# Patient Record
Sex: Male | Born: 1960 | Race: White | Hispanic: No | Marital: Married | Smoking: Never smoker
Health system: Southern US, Community
[De-identification: ages and names within clinical notes are randomized; demographics above are authoritative.]

## PROBLEM LIST (undated history)

## (undated) DIAGNOSIS — F32A Depression, unspecified: Secondary | ICD-10-CM

## (undated) DIAGNOSIS — I10 Essential (primary) hypertension: Secondary | ICD-10-CM

## (undated) DIAGNOSIS — G4733 Obstructive sleep apnea (adult) (pediatric): Secondary | ICD-10-CM

## (undated) DIAGNOSIS — M5137 Other intervertebral disc degeneration, lumbosacral region: Secondary | ICD-10-CM

## (undated) DIAGNOSIS — M2342 Loose body in knee, left knee: Secondary | ICD-10-CM

## (undated) DIAGNOSIS — F988 Other specified behavioral and emotional disorders with onset usually occurring in childhood and adolescence: Secondary | ICD-10-CM

## (undated) DIAGNOSIS — M1711 Unilateral primary osteoarthritis, right knee: Secondary | ICD-10-CM

## (undated) DIAGNOSIS — M51379 Other intervertebral disc degeneration, lumbosacral region without mention of lumbar back pain or lower extremity pain: Secondary | ICD-10-CM

## (undated) DIAGNOSIS — J45909 Unspecified asthma, uncomplicated: Secondary | ICD-10-CM

## (undated) DIAGNOSIS — F329 Major depressive disorder, single episode, unspecified: Secondary | ICD-10-CM

## (undated) DIAGNOSIS — T7840XA Allergy, unspecified, initial encounter: Secondary | ICD-10-CM

## (undated) DIAGNOSIS — M1990 Unspecified osteoarthritis, unspecified site: Secondary | ICD-10-CM

## (undated) DIAGNOSIS — E785 Hyperlipidemia, unspecified: Secondary | ICD-10-CM

## (undated) DIAGNOSIS — D649 Anemia, unspecified: Secondary | ICD-10-CM

## (undated) DIAGNOSIS — E039 Hypothyroidism, unspecified: Secondary | ICD-10-CM

## (undated) DIAGNOSIS — M1712 Unilateral primary osteoarthritis, left knee: Secondary | ICD-10-CM

## (undated) DIAGNOSIS — E119 Type 2 diabetes mellitus without complications: Secondary | ICD-10-CM

## (undated) HISTORY — DX: Type 2 diabetes mellitus without complications: E11.9

## (undated) HISTORY — DX: Major depressive disorder, single episode, unspecified: F32.9

## (undated) HISTORY — DX: Depression, unspecified: F32.A

## (undated) HISTORY — DX: Allergy, unspecified, initial encounter: T78.40XA

## (undated) HISTORY — DX: Anemia, unspecified: D64.9

## (undated) HISTORY — DX: Other specified behavioral and emotional disorders with onset usually occurring in childhood and adolescence: F98.8

## (undated) HISTORY — DX: Hyperlipidemia, unspecified: E78.5

## (undated) HISTORY — DX: Unspecified osteoarthritis, unspecified site: M19.90

## (undated) HISTORY — PX: HAND TENDON SURGERY: SHX663

## (undated) HISTORY — PX: KNEE ARTHROSCOPY: SUR90

## (undated) HISTORY — DX: Obstructive sleep apnea (adult) (pediatric): G47.33

## (undated) HISTORY — DX: Essential (primary) hypertension: I10

## (undated) HISTORY — PX: JOINT REPLACEMENT: SHX530

---
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
## 1998-08-06 HISTORY — PX: RHINOPLASTY: SUR1284

## 1999-03-16 ENCOUNTER — Emergency Department (HOSPITAL_COMMUNITY): Admission: EM | Admit: 1999-03-16 | Discharge: 1999-03-16 | Payer: Self-pay | Admitting: Emergency Medicine

## 1999-04-07 HISTORY — PX: RHINOPLASTY: SUR1284

## 2004-06-10 ENCOUNTER — Ambulatory Visit: Payer: Self-pay | Admitting: Family Medicine

## 2004-07-17 ENCOUNTER — Ambulatory Visit: Payer: Self-pay | Admitting: Internal Medicine

## 2004-07-28 ENCOUNTER — Ambulatory Visit: Payer: Self-pay | Admitting: Internal Medicine

## 2005-01-15 ENCOUNTER — Ambulatory Visit: Payer: Self-pay | Admitting: Internal Medicine

## 2005-08-07 ENCOUNTER — Ambulatory Visit: Payer: Self-pay | Admitting: Internal Medicine

## 2006-05-21 ENCOUNTER — Ambulatory Visit: Payer: Self-pay | Admitting: Family Medicine

## 2006-05-21 LAB — CONVERTED CEMR LAB
AST: 29 units/L (ref 0–37)
BUN: 17 mg/dL (ref 6–23)
CO2: 29 meq/L (ref 19–32)
Calcium: 9.9 mg/dL (ref 8.4–10.5)
Chloride: 101 meq/L (ref 96–112)
Creatinine, Ser: 1.2 mg/dL (ref 0.4–1.5)
GFR calc non Af Amer: 70 mL/min
Glucose, Bld: 108 mg/dL — ABNORMAL HIGH (ref 70–99)
HCT: 45 % (ref 39.0–52.0)
Hemoglobin: 14.9 g/dL (ref 13.0–17.0)
Lymphocytes Relative: 24.4 % (ref 12.0–46.0)
MCHC: 33.1 g/dL (ref 30.0–36.0)
MCV: 85.9 fL (ref 78.0–100.0)
Monocytes Absolute: 0.6 10*3/uL (ref 0.2–0.7)
Monocytes Relative: 6.3 % (ref 3.0–11.0)
Neutro Abs: 6.2 10*3/uL (ref 1.4–7.7)
Platelets: 269 10*3/uL (ref 150–400)
RBC: 5.24 M/uL (ref 4.22–5.81)
Sodium: 140 meq/L (ref 135–145)
Total Bilirubin: 0.9 mg/dL (ref 0.3–1.2)
Total Protein: 6.6 g/dL (ref 6.0–8.3)
WBC: 9.1 10*3/uL (ref 4.5–10.5)

## 2006-07-24 ENCOUNTER — Ambulatory Visit: Payer: Self-pay | Admitting: Internal Medicine

## 2006-08-06 HISTORY — PX: HAND TENDON SURGERY: SHX663

## 2006-09-05 ENCOUNTER — Ambulatory Visit: Payer: Self-pay | Admitting: Internal Medicine

## 2006-11-05 ENCOUNTER — Ambulatory Visit (HOSPITAL_BASED_OUTPATIENT_CLINIC_OR_DEPARTMENT_OTHER): Admission: RE | Admit: 2006-11-05 | Discharge: 2006-11-05 | Payer: Self-pay | Admitting: Otolaryngology

## 2007-06-26 DIAGNOSIS — R519 Headache, unspecified: Secondary | ICD-10-CM | POA: Insufficient documentation

## 2007-06-26 DIAGNOSIS — R51 Headache: Secondary | ICD-10-CM

## 2007-06-26 HISTORY — DX: Headache, unspecified: R51.9

## 2007-07-17 ENCOUNTER — Ambulatory Visit: Payer: Self-pay | Admitting: Family Medicine

## 2007-07-17 DIAGNOSIS — J309 Allergic rhinitis, unspecified: Secondary | ICD-10-CM | POA: Insufficient documentation

## 2007-07-17 DIAGNOSIS — I10 Essential (primary) hypertension: Secondary | ICD-10-CM | POA: Insufficient documentation

## 2007-07-17 DIAGNOSIS — F329 Major depressive disorder, single episode, unspecified: Secondary | ICD-10-CM

## 2007-07-17 DIAGNOSIS — E785 Hyperlipidemia, unspecified: Secondary | ICD-10-CM

## 2007-08-12 ENCOUNTER — Encounter: Payer: Self-pay | Admitting: Internal Medicine

## 2007-10-14 ENCOUNTER — Ambulatory Visit: Payer: Self-pay | Admitting: Internal Medicine

## 2007-11-11 ENCOUNTER — Encounter: Payer: Self-pay | Admitting: Internal Medicine

## 2007-12-12 ENCOUNTER — Telehealth: Payer: Self-pay | Admitting: Internal Medicine

## 2008-02-26 ENCOUNTER — Ambulatory Visit: Payer: Self-pay | Admitting: Gastroenterology

## 2008-02-26 DIAGNOSIS — K5900 Constipation, unspecified: Secondary | ICD-10-CM | POA: Insufficient documentation

## 2008-03-02 ENCOUNTER — Encounter: Payer: Self-pay | Admitting: Internal Medicine

## 2008-03-07 ENCOUNTER — Emergency Department (HOSPITAL_COMMUNITY): Admission: EM | Admit: 2008-03-07 | Discharge: 2008-03-07 | Payer: Self-pay | Admitting: Emergency Medicine

## 2008-03-10 ENCOUNTER — Encounter: Payer: Self-pay | Admitting: Internal Medicine

## 2008-05-17 ENCOUNTER — Telehealth: Payer: Self-pay | Admitting: *Deleted

## 2008-06-04 ENCOUNTER — Encounter: Payer: Self-pay | Admitting: Internal Medicine

## 2008-09-29 ENCOUNTER — Encounter: Payer: Self-pay | Admitting: Internal Medicine

## 2008-10-06 ENCOUNTER — Encounter: Payer: Self-pay | Admitting: Internal Medicine

## 2008-11-28 ENCOUNTER — Ambulatory Visit (HOSPITAL_COMMUNITY): Admission: RE | Admit: 2008-11-28 | Discharge: 2008-11-28 | Payer: Self-pay | Admitting: Emergency Medicine

## 2009-01-05 ENCOUNTER — Encounter: Payer: Self-pay | Admitting: Internal Medicine

## 2009-04-17 ENCOUNTER — Emergency Department (HOSPITAL_COMMUNITY): Admission: EM | Admit: 2009-04-17 | Discharge: 2009-04-17 | Payer: Self-pay | Admitting: Family Medicine

## 2009-04-20 ENCOUNTER — Encounter: Payer: Self-pay | Admitting: Internal Medicine

## 2009-04-21 ENCOUNTER — Encounter: Payer: Self-pay | Admitting: Internal Medicine

## 2009-06-13 ENCOUNTER — Ambulatory Visit: Payer: Self-pay | Admitting: Internal Medicine

## 2009-06-13 DIAGNOSIS — E119 Type 2 diabetes mellitus without complications: Secondary | ICD-10-CM | POA: Insufficient documentation

## 2009-10-18 ENCOUNTER — Ambulatory Visit: Payer: Self-pay | Admitting: Internal Medicine

## 2009-10-18 DIAGNOSIS — R5381 Other malaise: Secondary | ICD-10-CM | POA: Insufficient documentation

## 2009-10-18 DIAGNOSIS — R5383 Other fatigue: Secondary | ICD-10-CM

## 2009-10-20 ENCOUNTER — Ambulatory Visit: Payer: Self-pay | Admitting: Internal Medicine

## 2009-10-21 ENCOUNTER — Telehealth: Payer: Self-pay | Admitting: Internal Medicine

## 2009-10-21 ENCOUNTER — Encounter: Payer: Self-pay | Admitting: Internal Medicine

## 2009-10-21 ENCOUNTER — Telehealth (INDEPENDENT_AMBULATORY_CARE_PROVIDER_SITE_OTHER): Payer: Self-pay | Admitting: *Deleted

## 2009-10-21 DIAGNOSIS — D509 Iron deficiency anemia, unspecified: Secondary | ICD-10-CM

## 2009-10-21 LAB — CONVERTED CEMR LAB: Transferrin: 303.5 mg/dL (ref 212.0–360.0)

## 2009-10-26 ENCOUNTER — Telehealth: Payer: Self-pay | Admitting: *Deleted

## 2009-10-27 ENCOUNTER — Ambulatory Visit: Payer: Self-pay | Admitting: Gastroenterology

## 2009-10-27 ENCOUNTER — Encounter (INDEPENDENT_AMBULATORY_CARE_PROVIDER_SITE_OTHER): Payer: Self-pay | Admitting: *Deleted

## 2009-10-27 LAB — CONVERTED CEMR LAB
IgA: 240 mg/dL (ref 68–378)
Tissue Transglutaminase Ab, IgA: 0.4 units (ref <7)

## 2009-10-28 ENCOUNTER — Telehealth: Payer: Self-pay | Admitting: Gastroenterology

## 2009-11-07 ENCOUNTER — Ambulatory Visit: Payer: Self-pay | Admitting: Gastroenterology

## 2009-11-09 ENCOUNTER — Encounter: Payer: Self-pay | Admitting: Gastroenterology

## 2009-11-14 ENCOUNTER — Telehealth: Payer: Self-pay | Admitting: Gastroenterology

## 2009-11-15 ENCOUNTER — Encounter: Payer: Self-pay | Admitting: Internal Medicine

## 2009-12-05 ENCOUNTER — Encounter: Payer: Self-pay | Admitting: Internal Medicine

## 2009-12-29 ENCOUNTER — Encounter: Payer: Self-pay | Admitting: Internal Medicine

## 2010-02-03 LAB — HM DIABETES EYE EXAM: HM Diabetic Eye Exam: NORMAL

## 2010-02-20 ENCOUNTER — Encounter: Payer: Self-pay | Admitting: Internal Medicine

## 2010-04-11 ENCOUNTER — Telehealth: Payer: Self-pay | Admitting: Internal Medicine

## 2010-04-11 ENCOUNTER — Encounter: Payer: Self-pay | Admitting: Internal Medicine

## 2010-05-15 ENCOUNTER — Telehealth: Payer: Self-pay | Admitting: *Deleted

## 2010-05-18 ENCOUNTER — Ambulatory Visit: Payer: Self-pay | Admitting: Internal Medicine

## 2010-05-19 ENCOUNTER — Telehealth: Payer: Self-pay | Admitting: *Deleted

## 2010-05-19 LAB — CONVERTED CEMR LAB
Basophils Relative: 0.3 % (ref 0.0–3.0)
Eosinophils Absolute: 0.2 10*3/uL (ref 0.0–0.7)
Eosinophils Relative: 2 % (ref 0.0–5.0)
HCT: 42.7 % (ref 39.0–52.0)
Lymphs Abs: 2.2 10*3/uL (ref 0.7–4.0)
MCHC: 33.9 g/dL (ref 30.0–36.0)
MCV: 83.2 fL (ref 78.0–100.0)
Monocytes Absolute: 0.6 10*3/uL (ref 0.1–1.0)
Neutro Abs: 5.3 10*3/uL (ref 1.4–7.7)
RBC: 5.14 M/uL (ref 4.22–5.81)

## 2010-05-23 ENCOUNTER — Encounter: Payer: Self-pay | Admitting: Internal Medicine

## 2010-05-26 ENCOUNTER — Ambulatory Visit: Payer: Self-pay | Admitting: Internal Medicine

## 2010-05-26 DIAGNOSIS — E669 Obesity, unspecified: Secondary | ICD-10-CM

## 2010-07-07 ENCOUNTER — Emergency Department (HOSPITAL_COMMUNITY)
Admission: EM | Admit: 2010-07-07 | Discharge: 2010-07-07 | Payer: Self-pay | Source: Home / Self Care | Admitting: Emergency Medicine

## 2010-09-03 LAB — CONVERTED CEMR LAB
ALT: 37 units/L (ref 0–53)
Alkaline Phosphatase: 91 units/L (ref 39–117)
Basophils Relative: 0 % (ref 0.0–3.0)
Bilirubin, Direct: 0.1 mg/dL (ref 0.0–0.3)
CO2: 34 meq/L — ABNORMAL HIGH (ref 19–32)
Chloride: 106 meq/L (ref 96–112)
Eosinophils Relative: 1.6 % (ref 0.0–5.0)
GFR calc non Af Amer: 75.76 mL/min (ref >60)
Glucose, Bld: 128 mg/dL — ABNORMAL HIGH (ref 70–99)
Lymphs Abs: 1.8 10*3/uL (ref 0.7–4.0)
Monocytes Relative: 2.9 % — ABNORMAL LOW (ref 3.0–12.0)
Platelets: 239 10*3/uL (ref 150.0–400.0)
Potassium: 4.6 meq/L (ref 3.5–5.1)
RBC: 4.8 M/uL (ref 4.22–5.81)
RDW: 13.4 % (ref 11.5–14.6)
Sed Rate: 9 mm/hr (ref 0–22)
Sodium: 144 meq/L (ref 135–145)
TSH: 2.5 microintl units/mL (ref 0.35–5.50)
Total Bilirubin: 0.4 mg/dL (ref 0.3–1.2)
Total CK: 87 units/L (ref 7–232)
Total Protein: 7 g/dL (ref 6.0–8.3)
WBC: 7.8 10*3/uL (ref 4.5–10.5)

## 2010-09-05 NOTE — Letter (Signed)
Summary: Diabetic Instructions  [facility]  [address] [place], [place] [address]   Phone: [phone]  Fax: [phone]    Rickey Wright 11/15/1960 MRN: [id number]   _X  _   ORAL DIABETIC MEDICATION INSTRUCTIONS  The day before your procedure:   Take your diabetic pill as you do normally  The day of your procedure:   Do not take your diabetic pill    We will check your blood sugar levels during the admission process and again in Recovery before discharging you home  ________________________________________________________________________  [name]   INSULIN (LONG ACTING) MEDICATION INSTRUCTIONS (Lantus, NPH, 70/30, Humulin, Novolin-N)   The day before your procedure:   Take  your regular evening dose    The day of your procedure:   Do not take your morning dose    _  _   INSULIN (SHORT ACTING) MEDICATION INSTRUCTIONS (Regular, Humulog, Novolog)   The day before your procedure:   Do not take your evening dose   The day of your procedure:   Do not take your morning dose   _  _   INSULIN PUMP MEDICATION INSTRUCTIONS  We will contact the physician managing your diabetic care for written dosage instructions for the day before your procedure and the day of your procedure.  Once we have received the instructions, we will contact you.

## 2010-09-05 NOTE — Consult Note (Signed)
Summary: [facility] Endocrinology & Diabetes  [facility] Endocrinology & Diabetes   Imported By: Maryln Gottron 12/12/2009 15:34:46  _____________________________________________________________________  External Attachment:    Type:   Image     Comment:   External Document

## 2010-09-05 NOTE — Progress Notes (Signed)
Summary: Pt req copy of labs be sent to Dr. Shelbie Ammons office  Phone Note Call from Patient Call back at Home Phone [phone]   Caller: [name] Summary of Call: Pt is needing to get copy of labs sent to Dr. Jerline Pain office fax# [phone] Rickey Wright.  Please call if there are any questions.  Initial call taken by: Lucy Antigua,  October 26, 2009 9:48 AM  Follow-up for Phone Call        Faxed labs to Dr. Altheimer's office Follow-up by: Romualdo Bolk, CMA [name]),  October 26, 2009 2:37 PM

## 2010-09-05 NOTE — Progress Notes (Signed)
Summary: results  Phone Note Call from Patient Call back at Home Phone [phone]   Caller: Patient Call For: Jarold Motto Reason for Call: Talk to Nurse, Lab or Test Results Summary of Call: Patient wantrs to know the levels of his hemmoglobin states that he had that done yesterday Initial call taken by: Tawni Levy,  October 28, 2009 11:53 AM  Follow-up for Phone Call        CBC was not done yesterday.  Last checked on 10/18/09.  Pt notified for results from last CBC. Follow-up by: Ashok Cordia RN,  October 28, 2009 11:59 AM

## 2010-09-05 NOTE — Letter (Signed)
Summary: [facility] Endocrinology & Diabetes  [facility] Endocrinology & Diabetes   Imported By: Maryln Gottron 05/30/2010 15:47:35  _____________________________________________________________________  External Attachment:    Type:   Image     Comment:   External Document

## 2010-09-05 NOTE — Procedures (Signed)
Summary: Colonoscopy  Patient: Rickey Wright Note: All result statuses are Final unless otherwise noted.  Tests: (1) Colonoscopy (COL)   COL Colonoscopy           DONE     [facility]     [address]     [place], [place]  [address]           COLONOSCOPY PROCEDURE REPORT           PATIENT:  Rickey Wright, Rickey Wright  MR#:  [id number]     BIRTHDATE:  01-01-1961, 48 yrs. old  GENDER:  male     ENDOSCOPIST:  Vania Rea. Jarold Motto, MD, [facility]     REF. BY:     PROCEDURE DATE:  11/07/2009     PROCEDURE:  Colonoscopy with biopsy     ASA CLASS:  Class II     INDICATIONS:  Iron Deficiency Anemia     MEDICATIONS:   Fentanyl 75 mcg IV, Versed 7 mg IV           DESCRIPTION OF PROCEDURE:   After the risks benefits and     alternatives of the procedure were thoroughly explained, informed     consent was obtained.  Digital rectal exam was performed and     revealed no abnormalities.   The LB [id number] endoscope     was introduced through the anus and advanced to the cecum, which     was identified by both the appendix and ileocecal valve, without     limitations.  The quality of the prep was adequate, using     MoviPrep.  The instrument was then slowly withdrawn as the colon     was fully examined.     <<PROCEDUREIMAGES>>           FINDINGS:  A diminutive polyp was found at the hepatic flexure.     cold biopsy removed.  This was otherwise a normal examination of     the colon.   Retroflexed views in the rectum revealed no     abnormalities.    The scope was then withdrawn from the patient     and the procedure completed.           COMPLICATIONS:  None     ENDOSCOPIC IMPRESSION:     1) Diminutive polyp at the hepatic flexure     2) Otherwise normal examination     RECOMMENDATIONS:     1) If the polyp(s) removed today are proven to be adenomatous     (pre-cancerous) polyps, you will need a repeat colonoscopy in 5     years. Otherwise you should continue to follow colorectal cancer  screening guidelines for "routine risk" patients with colonoscopy     in 10 years.     REPEAT EXAM:  No           ______________________________     Vania Rea. Jarold Motto, MD, Clementeen Graham           CC:  Madelin Headings, MD           n.     Rosalie DoctorVania Rea. Patterson at 11/07/2009 11:43 AM           Amador Cunas, [id number]  Note: An exclamation mark (!) indicates a result that was not dispersed into the flowsheet. Document Creation Date: 11/07/2009 11:44 AM _______________________________________________________________________  (1) Order result status: Final Collection or observation date-time: 11/07/2009 11:40 Requested date-time:  Receipt date-time:  Reported  date-time:  Referring Physician:   Ordering Physician: Sheryn Bison [phone]) Specimen Source:  Source: Launa Grill Order Number: [id number] Lab site:   Appended Document: Colonoscopy     Procedures Next Due Date:    Colonoscopy: 11/2019

## 2010-09-05 NOTE — Letter (Signed)
Summary: [facility] Endocrinology & Diabetes  [facility] Endocrinology & Diabetes   Imported By: Maryln Gottron 05/30/2010 09:12:08  _____________________________________________________________________  External Attachment:    Type:   Image     Comment:   External Document

## 2010-09-05 NOTE — Miscellaneous (Signed)
Summary: Orders Update   Clinical Lists Changes  Orders: Added new Referral order of Gastroenterology Referral (GI) - Signed 

## 2010-09-05 NOTE — Letter (Signed)
Summary: [facility] Endocrinology and Diabetes  [facility] Endocrinology and Diabetes   Imported By: Maryln Gottron 02/23/2010 13:58:47  _____________________________________________________________________  External Attachment:    Type:   Image     Comment:   External Document

## 2010-09-05 NOTE — Letter (Signed)
Summary: [facility] Endocrinology & Diabetes  [facility] Endocrinology & Diabetes   Imported By: Maryln Gottron 01/09/2010 12:29:45  _____________________________________________________________________  External Attachment:    Type:   Image     Comment:   External Document

## 2010-09-05 NOTE — Progress Notes (Signed)
Summary: Request to have Ferritin Level Checked.  Phone Note Call from Patient Call back at [phone] (cell)   Caller: Spouse  Summary of Call: Pt would like to have order to have ferritin level checked this Thursday, is bringing son in for labwork and since he works out of town a lot.  Would like to have labs done then.  Please advise if this is ok. Initial call taken by: Trixie Dredge,  May 15, 2010 12:40 PM  Follow-up for Phone Call        does he need more than this/  dx iron deficiency:    CBCdiff /ferritin/ IBC and   then needs follow up visit   at some point   Follow-up by: Madelin Headings MD,  May 15, 2010 1:09 PM  Additional Follow-up for Phone Call Additional follow up Details #1::        Pt's wife aware of this. Pt will call back to schedule a follow up appt. Additional Follow-up by: Romualdo Bolk, CMA (AAMA),  May 15, 2010 3:22 PM

## 2010-09-05 NOTE — Progress Notes (Signed)
Summary: results  Phone Note Call from Patient Call back at Home Phone [phone]   Caller: wife, Nicholos Johns Call For: Dr. Jarold Motto Reason for Call: Lab or Test Results Summary of Call: would like lab results and would also like to know when they can expect biopsy results Initial call taken by: Vallarie Mare,  November 14, 2009 11:15 AM  Follow-up for Phone Call        Wife informed of lab and path resuts.  Letter has been [employer] path. Follow-up by: Ashok Cordia RN,  November 14, 2009 11:20 AM

## 2010-09-05 NOTE — Consult Note (Signed)
Summary: [facility] Endocrinology & Diabetes  [facility] Endocrinology & Diabetes   Imported By: Maryln Gottron 11/18/2009 15:45:02  _____________________________________________________________________  External Attachment:    Type:   Image     Comment:   External Document

## 2010-09-05 NOTE — Letter (Signed)
Summary: [facility] Instructions  [facility]  [address] [place], [place] [address]   Phone: [phone]  Fax: [phone]       Rickey Wright    October 16, 1960    MRN: [id number]        Procedure Day /Date: Monday, 11/07/09     Arrival Time: 9:30      Procedure Time: 10:30     Location of Procedure:                    _X _  [facility] (4th Floor)                        PREPARATION FOR COLONOSCOPY WITH MOVIPREP   Starting 5 days prior to your procedure 11/02/09 do not eat nuts, seeds, popcorn, corn, beans, peas,  salads, or any raw vegetables.  Do not take any fiber supplements (e.g. Metamucil, Citrucel, and Benefiber).  THE DAY BEFORE YOUR PROCEDURE         DATE: 11/06/09   DAY: Sunday  1.  Drink clear liquids the entire day-NO SOLID FOOD  2.  Do not drink anything colored red or purple.  Avoid juices with pulp.  No orange juice.  3.  Drink at least 64 oz. (8 glasses) of fluid/clear liquids during the day to prevent dehydration and help the prep work efficiently.  CLEAR LIQUIDS INCLUDE: Water Jello Ice Popsicles Tea (sugar ok, no milk/cream) Powdered fruit flavored drinks Coffee (sugar ok, no milk/cream) Gatorade Juice: apple, white grape, white cranberry  Lemonade Clear bullion, consomm, broth Carbonated beverages (any kind) Strained chicken noodle soup Hard Candy                             4.  In the morning, mix first dose of MoviPrep solution:    Empty 1 Pouch A and 1 Pouch B into the disposable container    Add lukewarm drinking water to the top line of the container. Mix to dissolve    Refrigerate (mixed solution should be used within 24 hrs)  5.  Begin drinking the prep at 5:00 p.m. The MoviPrep container is divided by 4 marks.   Every 15 minutes drink the solution down to the next mark (approximately 8 oz) until the full liter is complete.   6.  Follow completed prep with 16 oz of clear liquid of your choice (Nothing red or  purple).  Continue to drink clear liquids until bedtime.  7.  Before going to bed, mix second dose of MoviPrep solution:    Empty 1 Pouch A and 1 Pouch B into the disposable container    Add lukewarm drinking water to the top line of the container. Mix to dissolve    Refrigerate  THE DAY OF YOUR PROCEDURE      DATE: 11/07/09   DAY: Monday  Beginning at 5:30 a.m. (5 hours before procedure):         1. Every 15 minutes, drink the solution down to the next mark (approx 8 oz) until the full liter is complete.  2. Follow completed prep with 16 oz. of clear liquid of your choice.    3. You may drink clear liquids until 8:30 (2 HOURS BEFORE PROCEDURE).   MEDICATION INSTRUCTIONS  Unless otherwise instructed, you should take regular prescription medications with a small sip of water   as early as possible the morning  of your procedure.  Diabetic patients - see separate instructions.                OTHER INSTRUCTIONS  You will need a responsible adult at least 50 years of age to accompany you and drive you home.   This person must remain in the waiting room during your procedure.  Wear loose fitting clothing that is easily removed.  Leave jewelry and other valuables at home.  However, you may wish to bring a book to read or  an iPod/MP3 player to listen to music as you wait for your procedure to start.  Remove all body piercing jewelry and leave at home.  Total time from sign-in until discharge is approximately 2-3 hours.  You should go home directly after your procedure and rest.  You can resume normal activities the  day after your procedure.  The day of your procedure you should not:   Drive   Make legal decisions   Operate machinery   Drink alcohol   Return to work  You will receive specific instructions about eating, activities and medications before you leave.    The above instructions have been reviewed and explained to me by    _______________________    I fully understand and can verbalize these instructions _____________________________ Date _________

## 2010-09-05 NOTE — Assessment & Plan Note (Signed)
Summary: follow up on labs/ssc   Vital Signs:  Patient profile:   50 year old male Weight:      261 pounds Pulse rate:   78 / minute BP sitting:   110 / 70  (left arm) Cuff size:   large  Vitals Entered By: Romualdo Bolk, CMA (AAMA) (May 26, 2010 9:21 AM) CC: follow-up visit on labs, Hypertension Management   History of Present Illness: Rickey Wright  comes in today  for above  review of  irondeficincy and other issues .  he is a diabetic followed by his specialist. However he is been noted to have some iron deficiency with mild anemia last year. He saw Dr. Jarold Motto who did a colonoscopy showing the diminutive polyp. He had no further workup except for a negative celiac screen.   Since that evaluation he is now off lexapro   and walking regularly.  He feels better but realized his significant weight gain is contributing to his problems. His work schedule had been chaotic and he travels a lot. He has plans to change his lifestyle habits.[name]  at present he has no limitations of exercise except for time. He denies any bleeding nausea vomiting although feels he could have milk and tolerance and gets diarrhea gas when he has a lot of milk products.  His mood is much better currently.    Hypertension History:      He denies headache, chest pain, palpitations, dyspnea with exertion, orthopnea, PND, peripheral edema, visual symptoms, neurologic problems, syncope, and side effects from treatment.  He notes no problems with any antihypertensive medication side effects.        Positive major cardiovascular risk factors include male age 11 years old or older, diabetes, hyperlipidemia, and hypertension.  Negative major cardiovascular risk factors include non-tobacco-user status.      Preventive Screening-Counseling & Management  Alcohol-Tobacco     Alcohol drinks/day: 0     Smoking Status: never  Caffeine-Diet-Exercise     Caffeine use/day: 5+     Does Patient Exercise:  yes  Current Medications (verified): 1)  Zetia 10 Mg  Tabs (Ezetimibe) .... Take 1 Tablet By Mouth Once A Day 2)  Actos 45 Mg  Tabs (Pioglitazone Hcl) .... Take 1 Tablet By Mouth Once A Day 3)  Lipitor 10 Mg  Tabs (Atorvastatin Calcium) .... Take 1 Tablet By Mouth Once A Day 4)  Lotensin 10 Mg  Tabs (Benazepril Hcl) .... Take 1 Tablet By Mouth Once A Day 5)  Glucophage 500 Mg  Tabs (Metformin Hcl) .... 2 Am & 3 At Bedtime 6)  Lantus Solostar 100 Unit/ml  Soln (Insulin Glargine) .... 30 Units in The Am  Allergies (verified): 1)  ! * Horse Serum  Past History:  Past Medical History: Allergic rhinitis Depression Diabetes mellitus, type I Hyperlipidemia Hypertension Arthritis Diabetes Mellitis Iron deficiency  Past History:  Care Management: Endocrinology: Althiemer Psychiatry: Donnie Aho ENT: Pollyann Kennedy Neurology/ Sleep::Dohmier PMH-FH-SH reviewed-no changes except otherwise noted  Social History: Married Never Smoked Alcohol use-yes Drug use-no Regular exercise-yes travels with work  no ets. 5 children  Review of Systems  The patient denies anorexia, fever, weight loss, weight gain, vision loss, decreased hearing, hoarseness, chest pain, prolonged cough, hemoptysis, melena, hematochezia, transient blindness, difficulty walking, unusual weight change, abnormal bleeding, enlarged lymph nodes, and angioedema.         no neuro problems  Physical Exam  General:  Well-developed,well-nourished,in no acute distress; alert,appropriate and cooperative throughout examination Head:  normocephalic and atraumatic.   Eyes:  clear  Neck:  No deformities, masses, or tenderness noted. Lungs:  Normal respiratory effort, chest expands symmetrically. Lungs are clear to auscultation, no crackles or wheezes. Heart:  Normal rate and regular rhythm. S1 and S2 normal without gallop, murmur, click, rub or other extra sounds.no lifts.   Abdomen:  Bowel sounds positive,abdomen soft and  non-tender without masses, organomegaly or  noted. Pulses:  pulses intact without delay   Neurologic:  non focal  Pt is A&Ox3,affect,speech,memory,attention,&motor skills appear intact.  Skin:  turgor normal, color normal, no ecchymoses, no petechiae, and no purpura.   Cervical Nodes:  No lymphadenopathy noted Psych:  Normal eye contact, appropriate affect. Cognition appears normal.   Diabetes Management Exam:    Eye Exam:       Eye Exam done elsewhere          Date: 02/03/2010          Results: normal          Done by: Dr. Burgess Estelle   Impression & Recommendations:  Problem # 1:  ANEMIA, IRON DEFICIENCY (ICD-280.9) Assessment Improved  ? cause but  has   had insignificant  polyp on colon  no upper  evalu but no signs except recent  diarrhea  with excess milk products .  increase dietary iron without saturated  fats   Hgb: 14.5 (05/18/2010)   Hct: 42.7 (05/18/2010)   Platelets: 243.0 (05/18/2010) RBC: 5.14 (05/18/2010)   RDW: 14.4 (05/18/2010)   WBC: 8.2 (05/18/2010) MCV: 83.2 (05/18/2010)   MCHC: 33.9 (05/18/2010) Ferritin: 24.4 (05/18/2010) Iron: 72 (05/18/2010)   % Sat: 16.4 (05/18/2010) B12: 637 (10/20/2009)   Folate: 10.0 (10/20/2009)   TSH: 2.50 (10/18/2009)  Problem # 2:  DIABETES-TYPE 2 (ICD-250.00) under speciality care      could be better   a1c running in high 7 range His updated medication list for this problem includes:    Actos 45 Mg Tabs (Pioglitazone hcl) .[name]... Take 1 tablet by mouth once a day    Lotensin 10 Mg Tabs (Benazepril hcl) .[name]... Take 1 tablet by mouth once a day    Glucophage 500 Mg Tabs (Metformin hcl) .[name]... 2 am & 3 at bedtime    Lantus Solostar 100 Unit/ml Soln (Insulin glargine) .Marland KitchenMarland KitchenMarland KitchenMarland Kitchen 30 units in the am  Problem # 3:  OBESITY (ICD-278.00) patientaware and exrecising   counseled lifestyle   scedule makes thing s  difficult .     Ht: 70 (10/27/2009)   Wt: 261 (05/26/2010)   BMI: 35.86 (10/27/2009)  Problem # 4:  DEPRESSION (ICD-311) Assessment:  Improved off meds  could contribute to fatigue The following medications were removed from the medication list:    Lexapro 20 Mg Tabs (Escitalopram oxalate) ..... Once daily  Problem # 5:  MALAISE AND FATIGUE (ICD-780.79) Assessment: Improved  Complete Medication List: 1)  Zetia 10 Mg Tabs (Ezetimibe) .... Take 1 tablet by mouth once a day 2)  Actos 45 Mg Tabs (Pioglitazone hcl) .... Take 1 tablet by mouth once a day 3)  Lipitor 10 Mg Tabs (Atorvastatin calcium) .... Take 1 tablet by mouth once a day 4)  Lotensin 10 Mg Tabs (Benazepril hcl) .... Take 1 tablet by mouth once a day 5)  Glucophage 500 Mg Tabs (Metformin hcl) .... 2 am & 3 at bedtime 6)  Lantus Solostar 100 Unit/ml Soln (Insulin glargine) .... 30 units in the am  Other Orders: Admin 1st Vaccine ([address]) Flu Vaccine 78yrs + [phone])  Hypertension Assessment/Plan:      The patient's hypertensive risk group is category C: Target organ damage and/or diabetes.  Today's blood pressure is 110/70.  His blood pressure goal is < 130/80.  Patient Instructions: 1)  repeat  cbc diff and ferritin and  IBC   in 3 -4 months. 2)  then ROV   3)  Look at   weight watcher  plus on line. 4)  increase aerobic intensity    Orders Added: 1)  Admin 1st Vaccine [90471] 2)  Flu Vaccine 26yrs + [90658] 3)  Est. Patient Level IV [65784]    Flu Vaccine Consent Questions     Do you have a history of severe allergic reactions to this vaccine? no    Any prior history of allergic reactions to egg and/or gelatin? no    Do you have a sensitivity to the preservative Thimersol? no    Do you have a past history of Guillan-Barre Syndrome? no    Do you currently have an acute febrile illness? no    Have you ever had a severe reaction to latex? no    Vaccine information given and explained to patient? yes    Are you currently pregnant? no    Lot Number:[id number]   Exp Date:02/03/2011   Site Given  Left Deltoid [email or web address] Romualdo Bolk, CMA (AAMA)   May 26, 2010 9:24 AM greater than 50% of visit spent in counseling  25 minutes

## 2010-09-05 NOTE — Progress Notes (Signed)
Summary: req for appt  Phone Note Call from Patient   Caller: Patient's wife,   [phone] Summary of Call: Pts wife called in to see if there is any possible way that her husband can be seen (f/u on labwork) on the day that he is bringing his son Huston Foley) in for f/u on labwork..and the other 3 kids in for flu shots,  05/26/2010 at  11am?....Marland KitchenMarland KitchenAlso, mom Nicholos Johns) would like to have flu shot at same time if there is any possible way she can be fit into schedule..... Can you advise?  Initial call taken by: Debbra Riding,  May 19, 2010 9:05 AM  Follow-up for Phone Call        not at 11 but  could see them both  earlier in the day  at 915 and 930 (using the  915  am well cc slot)  Follow-up by: Madelin Headings MD,  May 19, 2010 12:40 PM  Additional Follow-up for Phone Call Additional follow up Details #1::        Left message for wife to call back.  Additional Follow-up by: Romualdo Bolk, CMA [name]),  May 19, 2010 1:05 PM    Additional Follow-up for Phone Call Additional follow up Details #2::    Pt's wife aware of this. Follow-up by: Romualdo Bolk, CMA (AAMA),  May 19, 2010 1:55 PM

## 2010-09-05 NOTE — Progress Notes (Signed)
Summary: colon  Phone Note Call from Patient   Caller: [phone],[id number] Summary of Call: Wants to make sure Dr. Rocky Crafts gets copy of colonoscopy.   Husband to request copy at time of colon per Dr. Demetrius Charity.  wife advised.  Rudy Jew, RN  October 21, 2009 4:27 PM  Initial call taken by: Rudy Jew, RN,  October 21, 2009 4:16 PM

## 2010-09-05 NOTE — Letter (Signed)
Summary: Patient Notice- Polyp Results  [facility]  [address] [place], [place] [address]   Phone: [phone]  Fax: [phone]        November 09, 2009 MRN: [id number]    [facility] [address] [place], [place]  [address]    Dear Mr. Sax,  I am pleased to inform you that the colon polyp(s) removed during your recent colonoscopy was (were) found to be benign (no cancer detected) upon pathologic examination.  I recommend you have a repeat colonoscopy examination in 10_ years to look for recurrent polyps, as having colon polyps increases your risk for having recurrent polyps or even colon cancer in the future.  Should you develop new or worsening symptoms of abdominal pain, bowel habit changes or bleeding from the rectum or bowels, please schedule an evaluation with either your primary care physician or with me.  Additional information/recommendations:  _X_ No further action with gastroenterology is needed at this time. Please      follow-up with your primary care physician for your other healthcare      needs.  __ Please call [phone] to schedule a return visit to review your      situation.  __ Please keep your follow-up visit as already scheduled.  __ Continue treatment plan as outlined the day of your exam.  Please call us if you are having persistent problems or have questions about your condition that have not been fully answered at this time.  Sincerely,  Mardella Layman MD [facility]  This letter has been electronically signed by your physician.  Appended Document: Patient Notice- Polyp Results letter mailed 4.11.11

## 2010-09-05 NOTE — Assessment & Plan Note (Signed)
Summary: referra from Dr. Pollyann Kennedy for chest congestion/dm   Vital Signs:  Patient profile:   50 year old male Weight:      249 pounds O2 Sat:      97 % on Room air Temp:     99.0 degrees F oral Pulse rate:   70 / minute BP sitting:   100 / 64  (left arm) Cuff size:   large  Vitals Entered By: Romualdo Bolk, CMA (AAMA) (October 18, 2009 2:44 PM)  O2 Flow:  Room air CC: Saw Dr. Pollyann Kennedy today and he said that sinusis were clear but pt feels like he is in a hazy. He has been feeling like this for 3-4 weeks. Some congestion. No coughing. Pain in the center of chest. Some SOB but no numbness or tingling in arms or legs. Pt states that he has been having ha's.   History of Present Illness: Rickey Wright comesin today for     SDA acute visit for above.  Saw Dr Pollyann Kennedy and said sinuses were clean but since he was feeling hazy and not well for a few weeks  he was referred to [place] as PCP.   Has had a sinus ha  and   bleed.  for weeks and using  saline washes . chest  hurts to inhale in middle    for the last few days . No sob or coughing or wheezing No diaphoresis although hasnt exercis in the last weeks because feels malaise and  " detached"  Feels  hazy for  weeks.  Ha s    no cough  something not well.    not as tolerant emotionally. .  Last endocrine check  in about 6  months ago and due soon. DM: sees Dr Althhiemer who manages his lipids and has given him ssri also.  BG  150   and above.  on lantus . Dohmeir :  put him on nuvigil  for 3 -4 months. some help.  has sleep apnea treated.  Altheimer   due for check .  no recent change.  Mood:   lexapro from  Altheimer.   Preventive Screening-Counseling & Management  Alcohol-Tobacco     Alcohol drinks/day: 0     Smoking Status: never  Caffeine-Diet-Exercise     Caffeine use/day: 5+     Does Patient Exercise: yes  Current Medications (verified): 1)  Zetia 10 Mg  Tabs (Ezetimibe) .... Take 1 Tablet By Mouth Once A Day 2)  Bayer Aspirin 325 Mg   Tabs (Aspirin) 3)  Actos 45 Mg  Tabs (Pioglitazone Hcl) .... Take 1 Tablet By Mouth Once A Day 4)  Lipitor 10 Mg  Tabs (Atorvastatin Calcium) .... Take 1 Tablet By Mouth Once A Day 5)  Lotensin 10 Mg  Tabs (Benazepril Hcl) .... Take 1 Tablet By Mouth Once A Day 6)  Glucophage 500 Mg  Tabs (Metformin Hcl) .... 2 Am & 3 At Bedtime 7)  Victoza 18 Mg/45ml Soln (Liraglutide) 8)  Niaspan 500 Mg  Tbcr (Niacin (Antihyperlipidemic)) 9)  Lantus Solostar 100 Unit/ml  Soln (Insulin Glargine) 10)  Lexapro 20 Mg  Tabs (Escitalopram Oxalate) .... Once Daily 11)  Nuvigil 250 Mg Tabs (Armodafinil)  Allergies (verified): 1)  ! * Horse Serum  Past History:  Past medical, surgical, family and social histories (including risk factors) reviewed, and no changes noted (except as noted below).  Past Medical History: Reviewed history from 02/26/2008 and no changes required. Allergic rhinitis Depression Diabetes mellitus,  type I Hyperlipidemia Hypertension Arthritis Diabetes  Past Surgical History: Reviewed history from 02/26/2008 and no changes required. rhinoplasty. Knee Arthroscopy x3 Knee Arthroscopy  Past History:  Care Management: Endocrinology: Althiemer Psychiatry: Donnie Aho ENT: Pollyann Kennedy Neurology/ Sleep::Dohmier  Family History: Reviewed history from 02/26/2008 and no changes required. child with epilepsy child with thyroiditis Adhd /LD Family History of Colon Cancer:Grandfather Family History of Colon Polyps:Mother Family History of Diabetes:Maternal Granfather  Family History of Heart Disease: Father  Social History: Reviewed history from 07/17/2007 and no changes required. Married Never Smoked Alcohol use-yes Drug use-no Regular exercise-yes travels with work   Review of Systems       The patient complains of headaches.  The patient denies anorexia, fever, vision loss, decreased hearing, hoarseness, syncope, dyspnea on exertion, peripheral edema, prolonged cough,  hemoptysis, abdominal pain, melena, hematochezia, severe indigestion/heartburn, hematuria, muscle weakness, difficulty walking, unusual weight change, abnormal bleeding, enlarged lymph nodes, and angioedema.    Physical Exam  General:  alert, well-developed, well-nourished, and well-hydrated.   Head:  normocephalic, atraumatic, and no abnormalities observed.   Eyes:  vision grossly intact, pupils equal, and pupils round.   Ears:  R ear normal, L ear normal, and no external deformities.   Nose:  no external deformity and no external erythema.   Mouth:  pharynx pink and moist.   Neck:  No deformities, masses, or tenderness noted. Chest Wall:  no deformities, no tenderness, and no masses.   Lungs:  Normal respiratory effort, chest expands symmetrically. Lungs are clear to auscultation, no crackles or wheezes. Heart:  Normal rate and regular rhythm. S1 and S2 normal without gallop, murmur, click, rub or other extra sounds.no lifts.   Abdomen:  Bowel sounds positive,abdomen soft and non-tender without masses, organomegaly or  noted. Msk:  no joint swelling and no joint warmth.   Pulses:  pulses intact without delay   Extremities:  no clubbing cyanosis or edema  Neurologic:  alert & oriented X3, cranial nerves II-XII intact, strength normal in all extremities, and gait normal.   Skin:  turgor normal, color normal, no ecchymoses, and no petechiae.   Cervical Nodes:  No lymphadenopathy noted Psych:  Oriented X3, memory intact for recent and remote, normally interactive, good eye contact, not anxious appearing, and not depressed appearing.  appears tired .    Impression & Recommendations:  Problem # 1:  MALAISE AND FATIGUE (ICD-780.79) detached feeling  vague ill feeling  with some ? resp signs  non focal exam   has specialist who have been foloowing all of this chronic conditions   .. consider med effects    .   get ekg cxray r/o metabolic and if contnuing    consider getting  neuro to see  him.    he is concerned about multiple meds  and  Orders: TLB-BMP (Basic Metabolic Panel-BMET) (80048-METABOL) TLB-CBC Platelet - w/Differential (85025-CBCD) TLB-Hepatic/Liver Function Pnl (80076-HEPATIC) TLB-TSH (Thyroid Stimulating Hormone) (84443-TSH) TLB-CK Total Only(Creatine Kinase/CPK) (82550-CK) TLB-Sedimentation Rate (ESR) (85652-ESR) TLB-T3, Free (Triiodothyronine) (84481-T3FREE) TLB-T4 (Thyrox), Free (75643-PI9J) T-2 View CXR (71020TC) Venipuncture (18841) EKG w/ Interpretation (93000)  Problem # 2:  DIABETES-TYPE 2 (ICD-250.00)  His updated medication list for this problem includes:    Bayer Aspirin 325 Mg Tabs (Aspirin)    Actos 45 Mg Tabs (Pioglitazone hcl) .[name]... Take 1 tablet by mouth once a day    Lotensin 10 Mg Tabs (Benazepril hcl) .[name]... Take 1 tablet by mouth once a day    Glucophage 500 Mg Tabs (  Metformin hcl) .[name]... 2 am & 3 at bedtime    Victoza 18 Mg/74ml Soln (Liraglutide)    Lantus Solostar 100 Unit/ml Soln (Insulin glargine)  Orders: TLB-BMP (Basic Metabolic Panel-BMET) (80048-METABOL) TLB-A1C / Hgb A1C (Glycohemoglobin) (83036-A1C) EKG w/ Interpretation (93000)  Problem # 3:  HYPERTENSION (ICD-401.9)  His updated medication list for this problem includes:    Lotensin 10 Mg Tabs (Benazepril hcl) .[name]... Take 1 tablet by mouth once a day  Orders: EKG w/ Interpretation (93000)  Problem # 4:  HEADACHE (ICD-784.0)  His updated medication list for this problem includes:    Bayer Aspirin 325 Mg Tabs (Aspirin)  Orders: TLB-CBC Platelet - w/Differential (85025-CBCD) TLB-CK Total Only(Creatine Kinase/CPK) (82550-CK) TLB-Sedimentation Rate (ESR) (85652-ESR) TLB-T3, Free (Triiodothyronine) (84481-T3FREE) TLB-T4 (Thyrox), Free [phone])  Problem # 5:  DEPRESSION (ICD-311)  His updated medication list for this problem includes:    Lexapro 20 Mg Tabs (Escitalopram oxalate) ..... Once daily  Problem # 6:  sleep apnea  day time sleepiness  per Dr Marylou Flesher.        Problem # 7:  HYPERLIPIDEMIA (ICD-272.4) labs done in fall per endo are very good on riple therapy. he states that he is not taking the niacin now. His updated medication list for this problem includes:    Zetia 10 Mg Tabs (Ezetimibe) .[name]... Take 1 tablet by mouth once a day    Lipitor 10 Mg Tabs (Atorvastatin calcium) .[name]... Take 1 tablet by mouth once a day    Niaspan 500 Mg Tbcr (Niacin (antihyperlipidemic))  Labs Reviewed: SGOT: 29 (05/21/2006)   SGPT: 42 (05/21/2006)  Complete Medication List: 1)  Zetia 10 Mg Tabs (Ezetimibe) .... Take 1 tablet by mouth once a day 2)  Bayer Aspirin 325 Mg Tabs (Aspirin) 3)  Actos 45 Mg Tabs (Pioglitazone hcl) .... Take 1 tablet by mouth once a day 4)  Lipitor 10 Mg Tabs (Atorvastatin calcium) .... Take 1 tablet by mouth once a day 5)  Lotensin 10 Mg Tabs (Benazepril hcl) .... Take 1 tablet by mouth once a day 6)  Glucophage 500 Mg Tabs (Metformin hcl) .... 2 am & 3 at bedtime 7)  Victoza 18 Mg/19ml Soln (Liraglutide) 8)  Niaspan 500 Mg Tbcr (Niacin (antihyperlipidemic)) 9)  Lantus Solostar 100 Unit/ml Soln (Insulin glargine) 10)  Lexapro 20 Mg Tabs (Escitalopram oxalate) .... Once daily 11)  Nuvigil 250 Mg Tabs (Armodafinil)  Patient Instructions: 1)  x ray   and labs  2)  You will be informed of lab results when available. then plan follow up

## 2010-09-05 NOTE — Assessment & Plan Note (Signed)
Summary: iron def anemia...em   History of Present Illness Visit Type: Follow-up Visit Primary GI MD: Sheryn Bison MD FACP FAGA Primary Provider: Berniece Andreas, MD Requesting Provider: n/a Chief Complaint: Iron def anemia  History of Present Illness:   Very pleasant 50 year old Caucasian male referred By Dr. Fabian Sharp per iron deficiency the hemoglobin 12.8, iron saturation 15%, and serum ferritin of 8.1. His chronic functional constipation and was previously seen and scheduled for colonoscopy, but this was not completed. He denies melena, hematochezia, but has mild constipation. He receives has some nonspecific nausea, easy fatigability, but no real dyspepsia, reflux symptoms, hepatobiliary complaints, emesis, anorexia or weight loss.  He is an insulin-dependent diabetic his hemoglobin A1c has been elevated to 9 despite oral medications and daily insulin. He also takes Lexapro 20 mg a day and statins for hyperlipidemia. He has not had previous endoscopic or barium studies of his intestinal tract. He denies any allergies to specific foods, history of celiac disease, osteoporosis, or any symptoms of collagen-vascular disease. His appetite is good and his weight is stable. He does have a family history of colon cancer in his grandfather in his 32s.     GI Review of Systems      Denies abdominal pain, acid reflux, belching, bloating, chest pain, dysphagia with liquids, dysphagia with solids, heartburn, loss of appetite, nausea, vomiting, vomiting blood, weight loss, and  weight gain.        Denies anal fissure, black tarry stools, change in bowel habit, constipation, diarrhea, diverticulosis, fecal incontinence, heme positive stool, hemorrhoids, irritable bowel syndrome, jaundice, light color stool, liver problems, rectal bleeding, and  rectal pain.    Current Medications (verified): 1)  Zetia 10 Mg  Tabs (Ezetimibe) .... Take 1 Tablet By Mouth Once A Day 2)  Actos 45 Mg  Tabs (Pioglitazone  Hcl) .... Take 1 Tablet By Mouth Once A Day 3)  Lipitor 10 Mg  Tabs (Atorvastatin Calcium) .... Take 1 Tablet By Mouth Once A Day 4)  Lotensin 10 Mg  Tabs (Benazepril Hcl) .... Take 1 Tablet By Mouth Once A Day 5)  Glucophage 500 Mg  Tabs (Metformin Hcl) .... 2 Am & 3 At Bedtime 6)  Lantus Solostar 100 Unit/ml  Soln (Insulin Glargine) .... 30 Units in The Am 7)  Lexapro 20 Mg  Tabs (Escitalopram Oxalate) .... Once Daily  Allergies (verified): 1)  ! * Horse Serum  Past History:  Past medical, surgical, family and social histories (including risk factors) reviewed for relevance to current acute and chronic problems.  Past Medical History: Reviewed history from 02/26/2008 and no changes required. Allergic rhinitis Depression Diabetes mellitus, type I Hyperlipidemia Hypertension Arthritis Diabetes  Past Surgical History: Reviewed history from 02/26/2008 and no changes required. rhinoplasty. Knee Arthroscopy x3 Knee Arthroscopy  Family History: Reviewed history from 02/26/2008 and no changes required. child with epilepsy child with thyroiditis Adhd /LD Family History of Colon Cancer:Grandfather Family History of Colon Polyps:Mother Family History of Diabetes:Maternal Granfather  Family History of Heart Disease: Father  Social History: Reviewed history from 10/18/2009 and no changes required. Married Never Smoked Alcohol use-yes Drug use-no Regular exercise-yes travels with work   Review of Systems       The patient complains of allergy/sinus.  The patient denies anemia, anxiety-new, arthritis/joint pain, back pain, blood in urine, breast changes/lumps, change in vision, confusion, cough, coughing up blood, depression-new, fainting, fatigue, fever, headaches-new, hearing problems, heart murmur, heart rhythm changes, itching, menstrual pain, muscle pains/cramps, night sweats, nosebleeds, pregnancy  symptoms, shortness of breath, skin rash, sleeping problems, sore throat,  swelling of feet/legs, swollen lymph glands, thirst - excessive , urination - excessive , urination changes/pain, urine leakage, vision changes, and voice change.    Vital Signs:  Patient profile:   50 year old male Height:      70 inches Weight:      249 pounds BMI:     35.86 BSA:     2.29 Pulse rate:   76 / minute Pulse rhythm:   regular BP sitting:   112 / 68  (left arm) Cuff size:   regular  Vitals Entered By: Ok Anis CMA (October 27, 2009 11:25 AM)  Physical Exam  General:  Well developed, well nourished, no acute distress.healthy appearing.   Head:  Normocephalic and atraumatic. Eyes:  PERRLA, no icterus.exam deferred to patient's ophthalmologist.   Lungs:  Clear throughout to auscultation. Heart:  Regular rate and rhythm; no murmurs, rubs,  or bruits. Abdomen:  Soft, nontender and nondistended. No masses, hepatosplenomegaly or hernias noted. Normal bowel sounds. Rectal:  deferred until time of colonoscopy.   Extremities:  No clubbing, cyanosis, edema or deformities noted. Neurologic:  Alert and  oriented x4;  grossly normal neurologically. Psych:  Alert and cooperative. Normal mood and affect.   Impression & Recommendations:  Problem # 1:  ANEMIA, IRON DEFICIENCY (ICD-280.9) Assessment Deteriorated Probable underlying colonic polyposis. Colonoscopy is been scheduled at his convenience with adjustment of his diabetic medications. Celiac serologies also been ordered. I have asked him to stop his iron therapy until his colonoscopy can be completed. Orders: TLB-IgA (Immunoglobulin A) (82784-IGA) T-Sprue Panel (Celiac Disease Aby Eval) (83516x3/[address])  Problem # 2:  MALAISE AND FATIGUE (ICD-780.79) Assessment: Unchanged Probably related to poor diabetic control.  Problem # 3:  CONSTIPATION (ICD-564.00) Assessment: Improved High fiber diet liberal p.o. fluids as tolerated. Orders: TLB-IgA (Immunoglobulin A) (82784-IGA) T-Sprue Panel (Celiac Disease Aby Eval)  (83516x3/[address])  Problem # 4:  FM HX MALIGNANT NEOPLASM GASTROINTESTINAL TRACT (ICD-V16.0) Assessment: Comment Only  Patient Instructions: 1)  Please go to the basement for lab work. 2)  You are being scheduled for a colonoscopy. 3)  The medication list was reviewed and reconciled.  All changed / newly prescribed medications were explained.  A complete medication list was provided to the patient / caregiver. 4)  Constipation and Hemorrhoids brochure given.  5)  Colonoscopy and Flexible Sigmoidoscopy brochure given.  6)  Conscious Sedation brochure given.  7)  Copy sent to : Dr. Berniece Andreas  Appended Document: iron def anemia...em    Clinical Lists Changes  Medications: Added new medication of MOVIPREP 100 GM  SOLR (PEG-KCL-NACL-NASULF-NA ASC-C) As per prep instructions. - Signed Rx of MOVIPREP 100 GM  SOLR (PEG-KCL-NACL-NASULF-NA ASC-C) As per prep instructions.;  #1 x 0;  Signed;  Entered by: Ashok Cordia RN;  Authorized by: Mardella Layman MD [facility];  Method used: Electronically to [facility]  [phone]*, [address] [place], [place]  [address], Ph: [phone] or [phone], Fax: [phone] Orders: Added new Test order of Colonoscopy (Colon) - Signed    Prescriptions: MOVIPREP 100 GM  SOLR (PEG-KCL-NACL-NASULF-NA ASC-C) As per prep instructions.  #1 x 0   Entered by:   Ashok Cordia RN   Authorized by:   Mardella Layman MD [facility]   Signed by:   Ashok Cordia RN on 10/27/2009   Method used:   [facility]ically to        [facility]  [address]  [phone]* (retail)  [address] [place], [place]  [address]       Ph: [phone] or [phone]       Fax: [phone]   RxID:   [id number]

## 2010-09-05 NOTE — Progress Notes (Signed)
Summary: lab results  Phone Note Call from Patient Call back at Work Phone [phone]   Caller: Patient----LIVE CALL Reason for Call: Lab or Test Results Summary of Call: Requesting lab results. Initial call taken by: Warnell Forester,  October 21, 2009 2:19 PM  Follow-up for Phone Call        see my note append Follow-up by: Madelin Headings MD,  October 21, 2009 2:38 PM  Additional Follow-up for Phone Call Additional follow up Details #1::        Results given. Additional Follow-up by: Rudy Jew, RN,  October 21, 2009 2:56 PM

## 2010-09-05 NOTE — Progress Notes (Signed)
Summary: Pt is having to be referred back to Dr Fabian Sharp by Endocrinologist  Phone Note Call from Patient Call back at [phone] [facility] cell   Caller: spouse - Nicholos Johns Summary of Call: Pts wife called and said that the pts Endocrinologist is referring pt back to Dr. Fabian Sharp, because pts [place] lvl was low. There are alot of things going on and pts spouse would like to talk with the nurse.   Initial call taken by: Lucy Antigua,  April 11, 2010 1:28 PM  Follow-up for Phone Call        Pts wife called and is checking on status. Pts wife, Nicholos Johns, is req call back today.   Follow-up by: Lucy Antigua,  April 12, 2010 9:38 AM  Additional Follow-up for Phone Call Additional follow up Details #1::        Pt's wife aware that we are waiting on md's approval. Additional Follow-up by: Romualdo Bolk, CMA [name]),  April 12, 2010 10:04 AM    Additional Follow-up for Phone Call Additional follow up Details #2::    I dont know wat i am being asked      what are we approving? I dont know what  they are asking  ..does he need an OV  ? Follow-up by: Madelin Headings MD,  April 12, 2010 1:45 PM  Additional Follow-up for Phone Call Additional follow up Details #3:: Details for Additional Follow-up Action Taken: Spoke to [place]- pt's ferritin is still  low. Meds haven't changed much. Wife to get a copy of labs and then we can see if Dr. Fabian Sharp wants to do just an ov or labs prior to the office visit. Pt has also seen Dr. Jarold Motto for this in the past. Pt's Endocrine wants him to see [place] about what to do as far as follow up on this. She said that he told her we could do some kind of stool test. Unsure of what type maybe she is talking about a IFOB? But he has already had the colonscopy 11/07/09. Additional Follow-up by: Romualdo Bolk, CMA (AAMA),  April 12, 2010 2:34 PM  OV  advised with all labs done to discuss plan.  WKP

## 2010-10-13 ENCOUNTER — Ambulatory Visit (INDEPENDENT_AMBULATORY_CARE_PROVIDER_SITE_OTHER): Payer: 59 | Admitting: Internal Medicine

## 2010-10-13 ENCOUNTER — Encounter: Payer: Self-pay | Admitting: Internal Medicine

## 2010-10-13 VITALS — BP 112/72 | HR 87 | Temp 99.2°F | Ht 70.0 in | Wt 260.0 lb

## 2010-10-13 DIAGNOSIS — F988 Other specified behavioral and emotional disorders with onset usually occurring in childhood and adolescence: Secondary | ICD-10-CM | POA: Insufficient documentation

## 2010-10-13 DIAGNOSIS — R599 Enlarged lymph nodes, unspecified: Secondary | ICD-10-CM

## 2010-10-13 DIAGNOSIS — R590 Localized enlarged lymph nodes: Secondary | ICD-10-CM

## 2010-10-13 DIAGNOSIS — J019 Acute sinusitis, unspecified: Secondary | ICD-10-CM

## 2010-10-13 MED ORDER — AMOXICILLIN-POT CLAVULANATE 875-125 MG PO TABS: 1.0000 | ORAL_TABLET | Freq: Two times a day (BID) | ORAL | Status: AC

## 2010-10-13 NOTE — Progress Notes (Signed)
  Subjective:    Patient ID: Rickey Wright, male    DOB: 26-Feb-1961, 50 y.o.   MRN: [id number]  HPI  patient comes in today for acute visit. After travel to [place] and back he has had a flareup of his sinusitis and believes he has an acute sinus infection at present. He has a history of this in the past. Over the last week he is having increasing green nasal discharge with face discomfort. Minor cough no fever. No ear pain and his hearing is okay. This is pretty typical and he gets his sinus infections sometimes triggered by barometric changes.   His diabetes is followed by endocrinologist. Since his last visit with [place] he has had a couple knee injuries and a ruptured right biceps tendon but he is battling. As been more difficult for him to exercise and does lose weight. However he is planning to do this.   Review of Systems  negative fever,  Sweats, change in vision chest pain shortness of breath wheezing.    Objective:   Physical Exam  well-developed well-nourished in no acute distress with some mild upper respiratory congestion. HEENT: Normocephalic ;atraumatic , Eyes;  PERRL, EOMs  Full, lids and conjunctiva clear,,Ears: no deformities, canals nl, TM landmarks normal, Nose:  Congested no discharge seen some mild tenderness left base.  Mouth : OP clear without lesion or edema .  neck supple without masses thyromegaly. LN :   Has a mildly tender about 1 cm lymph node in the right occipital area. No other adenopathy. Scalp shows 1-2 mm crusted area right vertex. No acute rashes.  Chest:  There is a rare left lower lung fields musical sound clears with coughing no wheezes rales breath sounds are equal.  Cardiac S1-S2 normal peripheral perfusion.       Assessment & Plan:   acute on chronic sinusitis with history of same.   Discussed options can do his saline irrigations and add an antibiotic at this point. Expectant management.  Reactive lymph node right occiput probably from scalp area  and not his respiratory infection we'll monitor this and followup if not improving over the next 10 days or so.   history of biceps shoulder rupture and knee injuries discussed injury prevention and rehabilitation.

## 2010-10-13 NOTE — Patient Instructions (Addendum)
Consider balance exercises.  Cal if ln doesn't  go down in another week or so.

## 2010-10-25 LAB — GLUCOSE, CAPILLARY
Glucose-Capillary: 208 mg/dL — ABNORMAL HIGH (ref 70–99)
Glucose-Capillary: 252 mg/dL — ABNORMAL HIGH (ref 70–99)

## 2010-11-21 ENCOUNTER — Encounter: Payer: Self-pay | Admitting: Internal Medicine

## 2010-11-21 ENCOUNTER — Ambulatory Visit (INDEPENDENT_AMBULATORY_CARE_PROVIDER_SITE_OTHER): Payer: 59 | Admitting: Internal Medicine

## 2010-11-21 VITALS — BP 120/80 | HR 72 | Wt 251.0 lb

## 2010-11-21 DIAGNOSIS — R5381 Other malaise: Secondary | ICD-10-CM

## 2010-11-21 DIAGNOSIS — R5383 Other fatigue: Secondary | ICD-10-CM

## 2010-11-21 DIAGNOSIS — R1013 Epigastric pain: Secondary | ICD-10-CM

## 2010-11-21 DIAGNOSIS — J309 Allergic rhinitis, unspecified: Secondary | ICD-10-CM

## 2010-11-21 DIAGNOSIS — E119 Type 2 diabetes mellitus without complications: Secondary | ICD-10-CM

## 2010-11-21 DIAGNOSIS — D509 Iron deficiency anemia, unspecified: Secondary | ICD-10-CM

## 2010-11-21 NOTE — Patient Instructions (Signed)
Take prilosec  Every day .  30  Min before a meal.    Minimize tums   . Can add on zantac if needed. Will contacted about abd ultrasound  Get copy of labs to [place]. Check up in 3-4 weeks.  Will decide  What other labs needed then.

## 2010-11-21 NOTE — Progress Notes (Signed)
Subjective:    Patient ID: Rickey Wright, male    DOB: 01-Dec-1960, 50 y.o.   MRN: [id number]  HPI Patient comes in today with wife for the above problem. He has had some ongoing fatigue for the last 6 months it could be related to his diabetes however in the last few weeks he has had some high epigastric pain like indigestion associated with burping and sometimes hiccups. No specific heartburn. He is taking TUMS on a regular basis which helps temporarily. There is no vomiting but some nausea and constipation. Discomfort is continuous with some aggravation perhaps after eating. No specific chest pain shortness of breath although he does have allergies and gets wheezing at times does have an inhaler. Family history of heart disease and his diabetic. There is no dysphasia some weight loss is intentional. To see his diabetologist in about 2 weeks and get a full set of laboratory studies. He is on the toes and metformin but this has been not for quite a while.  He is up-to-date on colonoscopy has never had an endoscopy. There is a family history of gallbladder disease in his mother. Father had AAA. He smoked   Review of Systems Mild sleep apnea and snoring previously evaluated not on CPAP. Sinus issues allergy. Mood seems to be stable has been off the Lexapro for about 6 months. No new exercise intolerance radiating chest pain. Diabetes not that well controlled   Last a1c? 8  No hx of neuropathy     Past Medical History  Diagnosis Date  . Allergy   . Depression   . Diabetes mellitus, type 2   . Hyperlipidemia   . Hypertension   . Arthritis   . Anemia     hx iron deficiency   . ADD (attention deficit disorder)   . OSA (obstructive sleep apnea)     mild not on cpap   Past Surgical History  Procedure Date  . Rhinoplasty   . Knee arthroscopy     x3    reports that he has never smoked. He does not have any smokeless tobacco history on file. He reports that he drinks alcohol. He reports  that he does not use illicit drugs. family history includes ADD / ADHD in his son; Aneurysm in his father; Cancer in his maternal grandfather; Colon polyps in his mother; Diabetes in his paternal grandmother; Heart disease in his father; Learning disabilities in his son; Seizures in his daughter; and Thyroid disease in his daughter. Allergies  Allergen Reactions  . Horse-Derived Products     REACTION: UNKNOWN    Objective:   Physical Exam    well-developed well-nourished in no acute distress does not appear depressed. Neck supple without masses or thyromegaly chest: Good air movement a few musical wheezes at the base no cough no rales or rhonchi. Abdomen:  Sof,t normal bowel sounds without hepatosplenomegaly, no guarding rebound or masses no CVA tenderness he points to the area of the high epigastrium as the area of the comfort Peripheral pulses present without delay Neurologic grossly intact. Skin nonicteric no acute rashes no bruising or bleeding     Assessment & Plan:  Nausea high epigastric pain most consistent with GI dysfunction. Possible GERD consider biliary disease.    Differential diagnosis also includes gastroparesis of diabetes. Celiac disease etc. I don't think this is CV disease but he is at risk  At this point will add a proton pump inhibitor dietary changes get an abdominal ultrasound to check for  biliary disease and then will followup at a checkup in 3-4 weeks.  Health care maintenance other issues he has not had any preventive visit in a while as he been getting has other care is through Dr. Leslie Wright.  Schedule for CPX within 3-4 weeks review outside labs at that point and on as needed. HX  mild iron deficiency poss malabsorption  in the past we'll consider rechecking that when he comes back. He is up-to-date on his colonoscopy may need an endoscopy or a small bowel evaluation.

## 2010-11-22 ENCOUNTER — Encounter: Payer: Self-pay | Admitting: Internal Medicine

## 2010-11-24 ENCOUNTER — Ambulatory Visit
Admission: RE | Admit: 2010-11-24 | Discharge: 2010-11-24 | Disposition: A | Discharge status: Home / Self Care | Payer: 59 | Source: Ambulatory Visit | Attending: Internal Medicine | Admitting: Internal Medicine

## 2010-11-24 ENCOUNTER — Other Ambulatory Visit: Payer: 59

## 2010-11-24 DIAGNOSIS — R1013 Epigastric pain: Secondary | ICD-10-CM

## 2010-11-24 NOTE — Progress Notes (Signed)
Left message to call back  

## 2010-12-19 ENCOUNTER — Ambulatory Visit (INDEPENDENT_AMBULATORY_CARE_PROVIDER_SITE_OTHER): Payer: 59 | Admitting: Internal Medicine

## 2010-12-19 ENCOUNTER — Encounter: Payer: Self-pay | Admitting: Internal Medicine

## 2010-12-19 VITALS — BP 110/70 | HR 78 | Ht 70.0 in | Wt 252.0 lb

## 2010-12-19 DIAGNOSIS — R5383 Other fatigue: Secondary | ICD-10-CM

## 2010-12-19 DIAGNOSIS — E119 Type 2 diabetes mellitus without complications: Secondary | ICD-10-CM

## 2010-12-19 DIAGNOSIS — F988 Other specified behavioral and emotional disorders with onset usually occurring in childhood and adolescence: Secondary | ICD-10-CM

## 2010-12-19 DIAGNOSIS — Z0000 Encounter for general adult medical examination without abnormal findings: Secondary | ICD-10-CM

## 2010-12-19 DIAGNOSIS — E785 Hyperlipidemia, unspecified: Secondary | ICD-10-CM

## 2010-12-19 DIAGNOSIS — R5381 Other malaise: Secondary | ICD-10-CM

## 2010-12-19 DIAGNOSIS — Z125 Encounter for screening for malignant neoplasm of prostate: Secondary | ICD-10-CM

## 2010-12-19 DIAGNOSIS — I10 Essential (primary) hypertension: Secondary | ICD-10-CM

## 2010-12-19 DIAGNOSIS — E669 Obesity, unspecified: Secondary | ICD-10-CM

## 2010-12-19 DIAGNOSIS — D509 Iron deficiency anemia, unspecified: Secondary | ICD-10-CM

## 2010-12-19 DIAGNOSIS — J309 Allergic rhinitis, unspecified: Secondary | ICD-10-CM

## 2010-12-19 LAB — HEPATIC FUNCTION PANEL
ALT: 39 U/L (ref 0–53)
Total Bilirubin: 0.7 mg/dL (ref 0.3–1.2)
Total Protein: 6.4 g/dL (ref 6.0–8.3)

## 2010-12-19 LAB — CBC WITH DIFFERENTIAL/PLATELET
Basophils Relative: 0.4 % (ref 0.0–3.0)
Eosinophils Relative: 1.6 % (ref 0.0–5.0)
Hemoglobin: 14 g/dL (ref 13.0–17.0)
Lymphocytes Relative: 24.6 % (ref 12.0–46.0)
Lymphs Abs: 2.1 10*3/uL (ref 0.7–4.0)
Monocytes Absolute: 0.5 10*3/uL (ref 0.1–1.0)
Neutro Abs: 5.9 10*3/uL (ref 1.4–7.7)
RBC: 4.96 Mil/uL (ref 4.22–5.81)
RDW: 14.5 % (ref 11.5–14.6)
WBC: 8.7 10*3/uL (ref 4.5–10.5)

## 2010-12-19 LAB — IBC PANEL
Iron: 103 ug/dL (ref 42–165)
Saturation Ratios: 27.4 % (ref 20.0–50.0)
Transferrin: 268.6 mg/dL (ref 212.0–360.0)

## 2010-12-19 LAB — TESTOSTERONE: Testosterone: 178.93 ng/dL — ABNORMAL LOW (ref 350.00–890.00)

## 2010-12-19 LAB — POCT URINALYSIS DIPSTICK
Protein, UA: NEGATIVE
Spec Grav, UA: 1.025
Urobilinogen, UA: 0.2

## 2010-12-19 LAB — PSA: PSA: 0.11 ng/mL (ref 0.10–4.00)

## 2010-12-19 NOTE — Progress Notes (Signed)
  Subjective:    Patient ID: Rickey Wright, male    DOB: Jan 29, 1961, 50 y.o.   MRN: [id number]  HPI Patient comes in for  cpx / Pv.  Since last   Visit he received a  Steroid shot and amoxicillin for right ear pressures and pain on a  flight.  And now doing better.  Abd pain is all better and he thinks could have been from a stomach virus  DM  Sees Dr Altheimer    Last labs were good.   Hg a1c was 7 range and  Better lipid  Panel.   Due for labs in 4-6 weeks.  Also on fish oil. Has mild OSA   Has smoke detector and wears seat belts.  No firearms. No excess sun exposure.  hasnt seen dentist regularly .   Review of Systems Fatigue     Sleeping  Ok After [employer].   Decrease libido some.  No cp sob swelling bleeding  Fever or Inc LN.  ocass nocturia  Has adhd . No peripheral neuropathy left foot has pain. Rest of ros neg or as per hpi.  No urgency  But som e slowing of stream  Had urethral dilitation as a child.    Objective:   Physical Exam Physical Exam: Vital signs reviewed WUJ:WJXB is a well-developed well-nourished alert cooperative  White male  who appears   stated age in no acute distress.  HEENT: normocephalic  traumatic , Eyes: PERRL EOM's full, conjunctiva clear, Nares: patent no deformity discharge or tenderness., Ears: no deformity EAC's clear TMs with normal landmarks. Mouth: clear OP, no lesions, edema.  Moist mucous membranes. Dentition in adequate repair. NECK: supple without masses, thyromegaly or bruits. CHEST/PULM:  Clear to auscultation and percussion breath sounds equal no wheeze , rales or rhonchi. No chest wall deformities or tenderness. CV: PMI is nondisplaced, S1 S2 no gallops, murmurs, rubs. Peripheral pulses are full without delay.No JVD .  ABDOMEN: Bowel sounds normal nontender  No guard or rebound, no hepato splenomegal no CVA tenderness.  No hernia. Extremtities:  No clubbing cyanosis or edema, no acute joint swelling or redness no focal atrophy NEURO:   Oriented x3, cranial nerves 3-12 appear to be intact, no obvious focal weakness,gait within normal limits no abnormal reflexes or asymmetrical SKIN: No acute rashes normal turgor, color, no bruising or petechiae. PSYCH: Oriented, good eye contact, no obvious depression anxiety, cognition and judgment appear normal. LN:  No cervical axillary or inguinal adenopathy Rectal decline      Assessment & Plan:  Preventive Health Care UTD  Had colon polyps   Dm: per Endo Fatigue multifactorial  With decrease libido poss hypothalamic hypogonadism  Mild osa mood etc but doing better HT   Controlled  LIPIDS ADHD  Mild iron deficiency no anemia now ? Cause  Non obvious.   Had colon polyps 2011 ? If ever had other gi eval. Poss poor absorption his abd pain is better and doesn't think it needs further eval .. PUD sx   Counseled. Get labs and then plan fu.

## 2010-12-19 NOTE — Patient Instructions (Signed)
Will notify you  of labs when available. Then plan follow up. Continue lifestyle intervention healthy eating and exercise .  Even 15 minutes per day would be helpfuls Can use otc topicals for plantar warts .

## 2010-12-20 LAB — CELIAC PANEL 10
Gliadin IgA: 7.9 U/mL (ref <20)
Gliadin IgG: 6.2 U/mL (ref <20)
IgA: 193 mg/dL (ref 68–378)
Tissue Transglut Ab: 6.5 U/mL (ref <20)

## 2010-12-22 ENCOUNTER — Encounter: Payer: Self-pay | Admitting: Internal Medicine

## 2010-12-22 DIAGNOSIS — G4733 Obstructive sleep apnea (adult) (pediatric): Secondary | ICD-10-CM | POA: Insufficient documentation

## 2010-12-22 NOTE — Op Note (Signed)
Rickey Wright, Rickey Wright                ACCOUNT NO.:  [id number]   MEDICAL RECORD NO.:  [id number]          PATIENT TYPE:  AMB   LOCATION:  [facility]                          FACILITY:  [facility]   PHYSICIAN:  Jefry H. Pollyann Kennedy, MD     DATE OF BIRTH:  [birth year]   DATE OF PROCEDURE:  11/05/2006  DATE OF DISCHARGE:                               OPERATIVE REPORT   REFERRING PHYSICIAN:  Dr. Fabian Sharp   PREOPERATIVE DIAGNOSES:  1. Nasal septal deviation.  2. Inferior turbinate hypertrophy.   POSTOPERATIVE DIAGNOSES:  1. Nasal septal deviation.  2. Inferior turbinate hypertrophy.   PROCEDURE:  1. Nasal septoplasty.  2. Submucosal resection inferior turbinates bilaterally.   SURGEON:  Dr. Pollyann Kennedy   ANESTHESIA:  General endotracheal anesthesia was used.   COMPLICATIONS:  None.   BLOOD LOSS:  Minimal.   FINDINGS:  Large inferior turbinates with thickening of the bone  bilaterally causing partial obstruction of the inferior meatus airway  bilaterally.  Severe leftward bony septal deviation and rightward caudal  and columellar deflexion.   HISTORY:  A 50 year old with a lifelong history of nasal obstruction.  Risks, benefits, alternatives, and complications of procedure explained  to the patient who seemed to understand and agrees to surgery.   PROCEDURE:  The patient was taken to the operating room, placed on the  operating table in supine position.  Following induction of general  endotracheal anesthesia, the patient was prepped and draped in standard  fashion.  Afrin spray was used preoperatively in the nasal cavities.  Xylocaine 1% with epinephrine was infiltrated into the septum, the  columella, and the inferior turbinates bilaterally.  Afrin soaked  pledgets were placed bilaterally.  1. Nasal septoplasty.  Left hemitransfixion incision was used to      approach the septal cartilage, and a mucoperichondrial flap was      developed posteriorly down the left side.  The bony cartilaginous  junction was divided and a similar flap was developed down the      right side.  The entire ethmoid plate and vomer were resected      because of the severe leftward deflection.  The ethmoid plate was      thickened and had what appeared to be a narrow cavity but was      easily chipped away using an open [employer] rongeur.  The      posterior cartilage was in good position.  The inferior cartilage      was at the midline as well.  The caudal cartilage was deflected      with a 60 degree bend towards the right side of the caudal limit.      This was all exposed by elevating the perichondrium off the front      part of the septal cartilage coming around to the right side.  A      wedge was resected in a vertical fashion from the left side to help      release the band and then a Ruben morselized was used to [employer]      the cartilage  to allow it to return to midline position.  It was      replaced back into this pocket.  The transfixion incision was      reapproximated with interrupted chromic suture.  The septal flaps      were quilted with plain gut.  2. Submucous resection inferior turbinates.  The inferior turbinates      were incised in the anterior aspect using a 15 scalpel in a      vertical fashion.  A Cottle elevator was used to elevate mucosa off      bone in all directions, and large fragments of bone were resected      using Takahashi forceps.  The turbinate remnants were outfractured      with the Cottle elevator.  The nasal airways were dramatically      improved.  The nasal cavities were suctioned of blood and      secretions, packed with rolled up Telfa coated with bacitracin.      The pharynx was suctioned as well.  The patient was then awakened,      extubated, and transferred to recovery in stable condition.      Jefry H. Pollyann Kennedy, MD  Electronically Signed     JHR/MEDQ  D:  11/05/2006  T:  11/05/2006  Job:  [id number]   cc:   Neta Mends. Fabian Sharp, MD

## 2011-01-02 ENCOUNTER — Telehealth: Payer: Self-pay | Admitting: *Deleted

## 2011-01-02 NOTE — Telephone Encounter (Signed)
Please see results note.

## 2011-01-02 NOTE — Telephone Encounter (Signed)
Needs labs results. Pt is on prednisone for inner ear infection.

## 2011-01-03 NOTE — Progress Notes (Signed)
Pt's wife aware.

## 2011-01-03 NOTE — Telephone Encounter (Signed)
Pt's wife aware of results

## 2011-02-05 ENCOUNTER — Encounter: Payer: Self-pay | Admitting: Internal Medicine

## 2011-02-05 ENCOUNTER — Ambulatory Visit (INDEPENDENT_AMBULATORY_CARE_PROVIDER_SITE_OTHER): Payer: 59 | Admitting: Internal Medicine

## 2011-02-05 VITALS — BP 110/60 | HR 72 | Wt 258.0 lb

## 2011-02-05 DIAGNOSIS — T24239A Burn of second degree of unspecified lower leg, initial encounter: Secondary | ICD-10-CM

## 2011-02-05 DIAGNOSIS — L089 Local infection of the skin and subcutaneous tissue, unspecified: Secondary | ICD-10-CM | POA: Insufficient documentation

## 2011-02-05 DIAGNOSIS — R609 Edema, unspecified: Secondary | ICD-10-CM | POA: Insufficient documentation

## 2011-02-05 DIAGNOSIS — T24231A Burn of second degree of right lower leg, initial encounter: Secondary | ICD-10-CM

## 2011-02-05 DIAGNOSIS — Z23 Encounter for immunization: Secondary | ICD-10-CM

## 2011-02-05 DIAGNOSIS — E119 Type 2 diabetes mellitus without complications: Secondary | ICD-10-CM

## 2011-02-05 HISTORY — DX: Burn of second degree of right lower leg, initial encounter: T24.231A

## 2011-02-05 MED ORDER — CEPHALEXIN 500 MG PO CAPS: 500.0000 mg | ORAL_CAPSULE | Freq: Three times a day (TID) | ORAL | Status: AC
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
## 2011-03-27 ENCOUNTER — Other Ambulatory Visit: Payer: Self-pay | Admitting: Otolaryngology

## 2011-03-27 DIAGNOSIS — H9120 Sudden idiopathic hearing loss, unspecified ear: Secondary | ICD-10-CM

## 2011-04-05 ENCOUNTER — Ambulatory Visit
Admission: RE | Admit: 2011-04-05 | Discharge: 2011-04-05 | Disposition: A | Discharge status: Home / Self Care | Payer: 59 | Source: Ambulatory Visit | Attending: Otolaryngology | Admitting: Otolaryngology

## 2011-04-05 DIAGNOSIS — H9120 Sudden idiopathic hearing loss, unspecified ear: Secondary | ICD-10-CM

## 2011-04-05 MED ORDER — GADOBENATE DIMEGLUMINE 529 MG/ML IV SOLN
20.0000 mL | Freq: Once | INTRAVENOUS | Status: AC | PRN
  Administered 2011-04-05: 20 mL via INTRAVENOUS

## 2011-08-30 ENCOUNTER — Encounter: Payer: Self-pay | Admitting: Internal Medicine

## 2011-08-30 ENCOUNTER — Telehealth: Payer: Self-pay | Admitting: *Deleted

## 2011-08-30 ENCOUNTER — Ambulatory Visit (INDEPENDENT_AMBULATORY_CARE_PROVIDER_SITE_OTHER): Payer: BC Managed Care – PPO | Admitting: Internal Medicine

## 2011-08-30 ENCOUNTER — Ambulatory Visit (INDEPENDENT_AMBULATORY_CARE_PROVIDER_SITE_OTHER)
Admission: RE | Admit: 2011-08-30 | Discharge: 2011-08-30 | Disposition: A | Discharge status: Home / Self Care | Payer: BC Managed Care – PPO | Source: Ambulatory Visit | Attending: Internal Medicine | Admitting: Internal Medicine

## 2011-08-30 VITALS — BP 114/74 | HR 86 | Temp 98.7°F | Wt 250.0 lb

## 2011-08-30 DIAGNOSIS — R413 Other amnesia: Secondary | ICD-10-CM | POA: Insufficient documentation

## 2011-08-30 DIAGNOSIS — J329 Chronic sinusitis, unspecified: Secondary | ICD-10-CM | POA: Insufficient documentation

## 2011-08-30 DIAGNOSIS — F988 Other specified behavioral and emotional disorders with onset usually occurring in childhood and adolescence: Secondary | ICD-10-CM

## 2011-08-30 DIAGNOSIS — E119 Type 2 diabetes mellitus without complications: Secondary | ICD-10-CM

## 2011-08-30 DIAGNOSIS — J069 Acute upper respiratory infection, unspecified: Secondary | ICD-10-CM

## 2011-08-30 HISTORY — DX: Chronic sinusitis, unspecified: J32.9

## 2011-08-30 LAB — HEPATIC FUNCTION PANEL
ALT: 35 U/L (ref 0–53)
AST: 21 U/L (ref 0–37)
Albumin: 4.4 g/dL (ref 3.5–5.2)
Alkaline Phosphatase: 91 U/L (ref 39–117)
Bilirubin, Direct: 0 mg/dL (ref 0.0–0.3)
Total Protein: 7.6 g/dL (ref 6.0–8.3)

## 2011-08-30 LAB — CBC WITH DIFFERENTIAL/PLATELET
Basophils Relative: 0.1 % (ref 0.0–3.0)
Eosinophils Relative: 0.7 % (ref 0.0–5.0)
HCT: 44.2 % (ref 39.0–52.0)
MCV: 83.9 fl (ref 78.0–100.0)
Monocytes Absolute: 0.5 10*3/uL (ref 0.1–1.0)
Monocytes Relative: 4.7 % (ref 3.0–12.0)
Neutrophils Relative %: 75.6 % (ref 43.0–77.0)
RBC: 5.27 Mil/uL (ref 4.22–5.81)
WBC: 10.6 10*3/uL — ABNORMAL HIGH (ref 4.5–10.5)

## 2011-08-30 LAB — POCT URINALYSIS DIPSTICK
Blood, UA: NEGATIVE
Nitrite, UA: NEGATIVE
Protein, UA: NEGATIVE
Spec Grav, UA: 1.02
Urobilinogen, UA: 0.2
pH, UA: 5.5

## 2011-08-30 LAB — BASIC METABOLIC PANEL
BUN: 18 mg/dL (ref 6–23)
CO2: 29 mEq/L (ref 19–32)
GFR: 81.11 mL/min (ref >60.00)
Glucose, Bld: 123 mg/dL — ABNORMAL HIGH (ref 70–99)
Potassium: 4.6 mEq/L (ref 3.5–5.1)

## 2011-08-30 NOTE — Progress Notes (Signed)
  Subjective:    Patient ID: Rickey Wright, male    DOB: Jan 24, 1961, 51 y.o.   MRN: [id number]  HPI  Patient comes in today for SDA  For acute problem evaluation. Onset about 5 days ago Onset of sore throat and nasal congestion  And "confusion" at times .  Feels out of it.   His wife it appears that his attentional deficit symptoms are aggravated such as not remembering that he left the door open or driving to the wrong place or not remembering with one of his children said moments before. He denies any severe headache or other neurologic signs. No hallucinations or delusions he describes his nasal discharge has Thick abnormal  Looking sinus drainage.  Minor cough .  It doesn't the superior face pain usually house with his sinus disease. NO sig HA  .   Both parents.   Had illness. Possibly related to Tamiflu given for flu  Fever chills.  Sugars not checked in a while  Not eadtinas much.  Vision;  Hard t o focus  No diplopia.    Review of Systems Anorexa. No vomiting or diarrhea.  No new rash and no new meds .  Has adhd  Not on meds   Past history family history social history reviewed in the electronic medical record. To have dm labs done in a few weeks.    Objective:   Physical Exam WDWN in nad Looks well but subued  HEETN EYES PERRLA no dc nose mildl congestion no face pain Ear tms nl lm and intact  OP no lesion tongue midline Neck: Supple without adenopathy or masses or bruits Chest:  Clear to A&P without  rales or rhonchi except for rare tubular musical sound right base  CV:  S1-S2 no gallops or murmurs peripheral perfusion is normal Abdomen:  Sof,t normal bowel sounds without hepatosplenomegaly, no guarding rebound or masses no CVA tenderness No clubbing cyanosis or edema Skin: normal capillary refill ,turgor , color: No acute rashes ,petechiae or bruising NEURO: oriented x 3 CN 3-12 appear intact. No focal muscle weakness or atrophy Gait WNL.  Grossly non focal. No tremor or  abnormal movement.   disc with wife on phone     Assessment & Plan:  Acute uri  Flu like   Hx of recurrent sinusitis   Poss acute on chronic but no sever pain as usual. DM Mental attention memory change this could be exacerbation of his adhd but new for him . non focal neuro sx( had  had MRI in the past year cause of hearing off and on and this apparently was normal. ) r/o metabolic  pna bacterial infection

## 2011-08-30 NOTE — Telephone Encounter (Signed)
Opened in ERROR

## 2011-08-30 NOTE — Patient Instructions (Signed)
Will notify you  of labs when available. And x ray This acts like a viral infection but  concern about other  Bacterial infection with the  attentional problems. We should touch base tomorrow about how you are doing and if getting worse call of get care immediately.

## 2011-09-03 ENCOUNTER — Telehealth: Payer: Self-pay | Admitting: *Deleted

## 2011-09-03 MED ORDER — AMOXICILLIN 500 MG PO CAPS: 500.0000 mg | ORAL_CAPSULE | Freq: Three times a day (TID) | ORAL | Status: AC

## 2011-09-03 NOTE — Telephone Encounter (Signed)
Please advise if he was seen again and who rx the med.  If we are treating sinusitis then need 10 days of meds . How is the mental fogginess?

## 2011-09-03 NOTE — Telephone Encounter (Signed)
Pt was given Amoxil on Sat. He is feeling better. He still has some nasal discharge but not as dark and bloody. He wants to make sure this is okay to take.

## 2011-09-03 NOTE — Telephone Encounter (Signed)
Amoxil 500mg  1 tid #10. Pt is wanting to know if he needs to take more than this.

## 2011-09-03 NOTE — Telephone Encounter (Signed)
Pt states that the mental fogginess is better. Rx called in for 10 more days.

## 2012-04-28 ENCOUNTER — Encounter: Payer: Self-pay | Admitting: Internal Medicine

## 2012-06-23 ENCOUNTER — Other Ambulatory Visit: Payer: Self-pay | Admitting: Orthopedic Surgery

## 2012-06-25 ENCOUNTER — Encounter (HOSPITAL_BASED_OUTPATIENT_CLINIC_OR_DEPARTMENT_OTHER): Payer: Self-pay | Admitting: *Deleted

## 2012-06-25 NOTE — Progress Notes (Signed)
To come in for bmet-ekg-diabetic-mild sleep apnea-did get cpap-could not use-losing wt

## 2012-06-26 ENCOUNTER — Other Ambulatory Visit: Payer: Self-pay

## 2012-06-26 ENCOUNTER — Encounter (HOSPITAL_BASED_OUTPATIENT_CLINIC_OR_DEPARTMENT_OTHER)
Admission: RE | Admit: 2012-06-26 | Discharge: 2012-06-26 | Disposition: A | Discharge status: Home / Self Care | Payer: BC Managed Care – PPO | Source: Ambulatory Visit | Attending: Orthopedic Surgery | Admitting: Orthopedic Surgery

## 2012-06-26 LAB — BASIC METABOLIC PANEL
BUN: 16 mg/dL (ref 6–23)
CO2: 26 mEq/L (ref 19–32)
Calcium: 9.8 mg/dL (ref 8.4–10.5)
GFR calc non Af Amer: >90 mL/min (ref >90)
Glucose, Bld: 96 mg/dL (ref 70–99)

## 2012-06-27 ENCOUNTER — Encounter (HOSPITAL_BASED_OUTPATIENT_CLINIC_OR_DEPARTMENT_OTHER): Payer: Self-pay | Admitting: Orthopedic Surgery

## 2012-06-27 ENCOUNTER — Encounter (HOSPITAL_BASED_OUTPATIENT_CLINIC_OR_DEPARTMENT_OTHER): Payer: Self-pay | Admitting: Anesthesiology

## 2012-06-27 ENCOUNTER — Ambulatory Visit (HOSPITAL_BASED_OUTPATIENT_CLINIC_OR_DEPARTMENT_OTHER): Payer: BC Managed Care – PPO | Admitting: Anesthesiology

## 2012-06-27 ENCOUNTER — Encounter (HOSPITAL_BASED_OUTPATIENT_CLINIC_OR_DEPARTMENT_OTHER): Payer: Self-pay | Admitting: *Deleted

## 2012-06-27 ENCOUNTER — Ambulatory Visit (HOSPITAL_BASED_OUTPATIENT_CLINIC_OR_DEPARTMENT_OTHER)
Admission: RE | Admit: 2012-06-27 | Discharge: 2012-06-27 | Disposition: A | Discharge status: Home / Self Care | Payer: BC Managed Care – PPO | Source: Ambulatory Visit | Attending: Orthopedic Surgery | Admitting: Orthopedic Surgery

## 2012-06-27 ENCOUNTER — Encounter (HOSPITAL_BASED_OUTPATIENT_CLINIC_OR_DEPARTMENT_OTHER)
Admission: RE | Disposition: A | Discharge status: Home / Self Care | Payer: Self-pay | Source: Ambulatory Visit | Attending: Orthopedic Surgery

## 2012-06-27 DIAGNOSIS — M2240 Chondromalacia patellae, unspecified knee: Secondary | ICD-10-CM | POA: Insufficient documentation

## 2012-06-27 DIAGNOSIS — M2342 Loose body in knee, left knee: Secondary | ICD-10-CM | POA: Diagnosis present

## 2012-06-27 DIAGNOSIS — Z800 Family history of malignant neoplasm of digestive organs: Secondary | ICD-10-CM | POA: Insufficient documentation

## 2012-06-27 DIAGNOSIS — M1712 Unilateral primary osteoarthritis, left knee: Secondary | ICD-10-CM | POA: Diagnosis present

## 2012-06-27 DIAGNOSIS — Z83719 Family history of colon polyps, unspecified: Secondary | ICD-10-CM | POA: Insufficient documentation

## 2012-06-27 DIAGNOSIS — F329 Major depressive disorder, single episode, unspecified: Secondary | ICD-10-CM | POA: Insufficient documentation

## 2012-06-27 DIAGNOSIS — G4733 Obstructive sleep apnea (adult) (pediatric): Secondary | ICD-10-CM | POA: Insufficient documentation

## 2012-06-27 DIAGNOSIS — F988 Other specified behavioral and emotional disorders with onset usually occurring in childhood and adolescence: Secondary | ICD-10-CM | POA: Insufficient documentation

## 2012-06-27 DIAGNOSIS — Z8249 Family history of ischemic heart disease and other diseases of the circulatory system: Secondary | ICD-10-CM | POA: Insufficient documentation

## 2012-06-27 DIAGNOSIS — Z8371 Family history of colonic polyps: Secondary | ICD-10-CM | POA: Insufficient documentation

## 2012-06-27 DIAGNOSIS — E119 Type 2 diabetes mellitus without complications: Secondary | ICD-10-CM | POA: Insufficient documentation

## 2012-06-27 DIAGNOSIS — F3289 Other specified depressive episodes: Secondary | ICD-10-CM | POA: Insufficient documentation

## 2012-06-27 DIAGNOSIS — Z820 Family history of epilepsy and other diseases of the nervous system: Secondary | ICD-10-CM | POA: Insufficient documentation

## 2012-06-27 DIAGNOSIS — M2340 Loose body in knee, unspecified knee: Secondary | ICD-10-CM | POA: Insufficient documentation

## 2012-06-27 DIAGNOSIS — M129 Arthropathy, unspecified: Secondary | ICD-10-CM | POA: Insufficient documentation

## 2012-06-27 DIAGNOSIS — E785 Hyperlipidemia, unspecified: Secondary | ICD-10-CM | POA: Insufficient documentation

## 2012-06-27 HISTORY — DX: Loose body in knee, left knee: M23.42

## 2012-06-27 HISTORY — DX: Unilateral primary osteoarthritis, left knee: M17.12

## 2012-06-27 HISTORY — PX: KNEE ARTHROSCOPY: SHX127

## 2012-06-27 LAB — GLUCOSE, CAPILLARY: Glucose-Capillary: 120 mg/dL — ABNORMAL HIGH (ref 70–99)

## 2012-06-27 LAB — POCT HEMOGLOBIN-HEMACUE: Hemoglobin: 12.8 g/dL — ABNORMAL LOW (ref 13.0–17.0)

## 2012-06-27 SURGERY — ARTHROSCOPY, KNEE
Anesthesia: General | Site: Knee | Laterality: Left | Wound class: Clean

## 2012-06-27 MED ORDER — PROPOFOL 10 MG/ML IV BOLUS
INTRAVENOUS | Status: DC | PRN
  Administered 2012-06-27: 300 mg via INTRAVENOUS

## 2012-06-27 MED ORDER — LIDOCAINE HCL (CARDIAC) 20 MG/ML IV SOLN
INTRAVENOUS | Status: DC | PRN
  Administered 2012-06-27: 80 mg via INTRAVENOUS

## 2012-06-27 MED ORDER — HYDROMORPHONE HCL PF 1 MG/ML IJ SOLN
0.2500 mg | INTRAMUSCULAR | Status: DC | PRN
  Administered 2012-06-27: 0.5 mg via INTRAVENOUS

## 2012-06-27 MED ORDER — BUPIVACAINE HCL (PF) 0.5 % IJ SOLN
INTRAMUSCULAR | Status: DC | PRN
  Administered 2012-06-27: 20 mL

## 2012-06-27 MED ORDER — LACTATED RINGERS IV SOLN
INTRAVENOUS | Status: DC
  Administered 2012-06-27 (×2): via INTRAVENOUS

## 2012-06-27 MED ORDER — SODIUM CHLORIDE 0.9 % IR SOLN
Status: DC | PRN
  Administered 2012-06-27: 3000 mL

## 2012-06-27 MED ORDER — OXYCODONE HCL 5 MG/5ML PO SOLN: 5.0000 mg | Freq: Once | ORAL | Status: AC | PRN

## 2012-06-27 MED ORDER — ONDANSETRON HCL 4 MG/2ML IJ SOLN: 4.0000 mg | Freq: Once | INTRAMUSCULAR | Status: DC | PRN

## 2012-06-27 MED ORDER — OXYCODONE-ACETAMINOPHEN 10-325 MG PO TABS: 1.0000 | ORAL_TABLET | Freq: Four times a day (QID) | ORAL | Status: DC | PRN

## 2012-06-27 MED ORDER — ONDANSETRON HCL 4 MG/2ML IJ SOLN
INTRAMUSCULAR | Status: DC | PRN
  Administered 2012-06-27: 4 mg via INTRAVENOUS

## 2012-06-27 MED ORDER — PROMETHAZINE HCL 25 MG PO TABS
25.0000 mg | ORAL_TABLET | Freq: Four times a day (QID) | ORAL | Status: DC | PRN
Start: 2012-06-27 — End: 2012-12-03

## 2012-06-27 MED ORDER — OXYCODONE HCL 5 MG PO TABS
5.0000 mg | ORAL_TABLET | Freq: Once | ORAL | Status: AC | PRN
  Administered 2012-06-27: 5 mg via ORAL

## 2012-06-27 MED ORDER — MIDAZOLAM HCL 5 MG/5ML IJ SOLN
INTRAMUSCULAR | Status: DC | PRN
  Administered 2012-06-27: 2 mg via INTRAVENOUS

## 2012-06-27 MED ORDER — CEFAZOLIN SODIUM-DEXTROSE 2-3 GM-% IV SOLR
2.0000 g | INTRAVENOUS | Status: AC
  Administered 2012-06-27: 2 g via INTRAVENOUS

## 2012-06-27 MED ORDER — FENTANYL CITRATE 0.05 MG/ML IJ SOLN
INTRAMUSCULAR | Status: DC | PRN
  Administered 2012-06-27: 25 ug via INTRAVENOUS
  Administered 2012-06-27: 100 ug via INTRAVENOUS

## 2012-06-27 MED ORDER — KETOROLAC TROMETHAMINE 10 MG PO TABS: 10.0000 mg | ORAL_TABLET | Freq: Four times a day (QID) | ORAL | Status: DC | PRN

## 2012-06-27 SURGICAL SUPPLY — 55 items
APL SKNCLS STERI-STRIP NONHPOA (GAUZE/BANDAGES/DRESSINGS) ×1
BANDAGE ELASTIC 6 VELCRO ST LF (GAUZE/BANDAGES/DRESSINGS) ×2 IMPLANT
BANDAGE ESMARK 6X9 LF (GAUZE/BANDAGES/DRESSINGS) IMPLANT
BENZOIN TINCTURE PRP APPL 2/3 (GAUZE/BANDAGES/DRESSINGS) ×2 IMPLANT
BLADE CUDA 5.5 (BLADE) IMPLANT
BLADE CUDA GRT WHITE 3.5 (BLADE) IMPLANT
BLADE CUDA SHAVER 3.5 (BLADE) IMPLANT
BLADE CUTTER GATOR 3.5 (BLADE) ×2 IMPLANT
BLADE CUTTER MENIS 5.5 (BLADE) IMPLANT
BLADE GREAT WHITE 4.2 (BLADE) IMPLANT
BNDG CMPR 9X6 STRL LF SNTH (GAUZE/BANDAGES/DRESSINGS)
BNDG ESMARK 6X9 LF (GAUZE/BANDAGES/DRESSINGS)
CANISTER OMNI JUG 16 LITER (MISCELLANEOUS) ×2 IMPLANT
CANISTER SUCTION 2500CC (MISCELLANEOUS) IMPLANT
CLOTH BEACON ORANGE TIMEOUT ST (SAFETY) ×2 IMPLANT
CUFF TOURNIQUET SINGLE 34IN LL (TOURNIQUET CUFF) IMPLANT
CUTTER KNOT PUSHER 2-0 FIBERWI (INSTRUMENTS) IMPLANT
CUTTER MENISCUS  4.2MM (BLADE)
CUTTER MENISCUS 4.2MM (BLADE) IMPLANT
DRAPE ARTHROSCOPY W/POUCH 90 (DRAPES) ×2 IMPLANT
DRSG PAD ABDOMINAL 8X10 ST (GAUZE/BANDAGES/DRESSINGS) ×1 IMPLANT
DURAPREP 26ML APPLICATOR (WOUND CARE) ×2 IMPLANT
ELECT MENISCUS 165MM 90D (ELECTRODE) IMPLANT
ELECT REM PT RETURN 9FT ADLT (ELECTROSURGICAL)
ELECTRODE REM PT RTRN 9FT ADLT (ELECTROSURGICAL) IMPLANT
GLOVE BIO SURGEON STRL SZ8 (GLOVE) ×2 IMPLANT
GLOVE BIOGEL M STRL SZ7.5 (GLOVE) ×1 IMPLANT
GLOVE BIOGEL PI IND STRL 6.5 (GLOVE) IMPLANT
GLOVE BIOGEL PI IND STRL 8 (GLOVE) ×2 IMPLANT
GLOVE BIOGEL PI INDICATOR 6.5 (GLOVE) ×1
GLOVE BIOGEL PI INDICATOR 8 (GLOVE) ×3
GLOVE ECLIPSE 6.5 STRL STRAW (GLOVE) ×1 IMPLANT
GLOVE ORTHO TXT STRL SZ7.5 (GLOVE) ×2 IMPLANT
GOWN BRE IMP PREV XXLGXLNG (GOWN DISPOSABLE) ×4 IMPLANT
GOWN PREVENTION PLUS XLARGE (GOWN DISPOSABLE) ×1 IMPLANT
GOWN PREVENTION PLUS XXLARGE (GOWN DISPOSABLE) ×1 IMPLANT
HOLDER KNEE FOAM BLUE (MISCELLANEOUS) ×2 IMPLANT
KNEE WRAP E Z 3 GEL PACK (MISCELLANEOUS) ×1 IMPLANT
NDL MENISCAL REPAIR W/EYELT (NEEDLE) IMPLANT
NEEDLE MENISCAL REPAIR DBL ARM (NEEDLE) IMPLANT
NEEDLE MENISCAL REPAIR W/EYELT (NEEDLE) IMPLANT
PACK ARTHROSCOPY DSU (CUSTOM PROCEDURE TRAY) ×2 IMPLANT
PACK BASIN DAY SURGERY FS (CUSTOM PROCEDURE TRAY) ×2 IMPLANT
PAD CAST 4YDX4 CTTN HI CHSV (CAST SUPPLIES) IMPLANT
PADDING CAST COTTON 4X4 STRL (CAST SUPPLIES) ×2
PENCIL BUTTON HOLSTER BLD 10FT (ELECTRODE) IMPLANT
SET ARTHROSCOPY TUBING (MISCELLANEOUS) ×2
SET ARTHROSCOPY TUBING LN (MISCELLANEOUS) ×1 IMPLANT
SLEEVE SCD COMPRESS KNEE MED (MISCELLANEOUS) IMPLANT
SPONGE GAUZE 4X4 12PLY (GAUZE/BANDAGES/DRESSINGS) ×2 IMPLANT
STRIP CLOSURE SKIN 1/2X4 (GAUZE/BANDAGES/DRESSINGS) ×2 IMPLANT
SUT MNCRL AB 4-0 PS2 18 (SUTURE) ×2 IMPLANT
TOWEL OR 17X24 6PK STRL BLUE (TOWEL DISPOSABLE) ×2 IMPLANT
WAND STAR VAC 90 (SURGICAL WAND) ×1 IMPLANT
WATER STERILE IRR 1000ML POUR (IV SOLUTION) ×2 IMPLANT

## 2012-06-27 NOTE — Anesthesia Procedure Notes (Signed)
Procedure Name: LMA Insertion Date/Time: 06/27/2012 7:48 AM Performed by: Verlan Friends Pre-anesthesia Checklist: Patient identified, Emergency Drugs available, Suction available, Patient being monitored and Timeout performed Patient Re-evaluated:Patient Re-evaluated prior to inductionOxygen Delivery Method: Circle System Utilized Preoxygenation: Pre-oxygenation with 100% oxygen Intubation Type: IV induction Ventilation: Mask ventilation without difficulty LMA: LMA with gastric port inserted LMA Size: 5.0 Number of attempts: 1 Placement Confirmation: positive ETCO2 Tube secured with: Tape Dental Injury: Teeth and Oropharynx as per pre-operative assessment

## 2012-06-27 NOTE — Anesthesia Preprocedure Evaluation (Signed)
Anesthesia Evaluation  Patient identified by MRN, date of birth, ID band Patient awake    Reviewed: Allergy & Precautions, H&P , NPO status , Patient's Chart, lab work & pertinent test results  Airway Mallampati: I TM Distance: >3 FB Neck ROM: Full    Dental  (+) Teeth Intact and Dental Advisory Given   Pulmonary  breath sounds clear to auscultation        Cardiovascular hypertension, Pt. on medications Rhythm:Regular Rate:Normal     Neuro/Psych    GI/Hepatic   Endo/Other  diabetes  Renal/GU      Musculoskeletal   Abdominal   Peds  Hematology   Anesthesia Other Findings   Reproductive/Obstetrics                           Anesthesia Physical Anesthesia Plan  ASA: III  Anesthesia Plan: General   Post-op Pain Management:    Induction: Intravenous  Airway Management Planned: LMA  Additional Equipment:   Intra-op Plan:   Post-operative Plan: Extubation in OR  Informed Consent: I have reviewed the patients History and Physical, chart, labs and discussed the procedure including the risks, benefits and alternatives for the proposed anesthesia with the patient or authorized representative who has indicated his/her understanding and acceptance.   Dental advisory given  Plan Discussed with: CRNA, Surgeon and Anesthesiologist  Anesthesia Plan Comments:         Anesthesia Quick Evaluation

## 2012-06-27 NOTE — H&P (Signed)
PREOPERATIVE H&P  Chief Complaint: left knee loose body osteochondral fragment, chondromalacia patella  HPI: Rickey Wright is a 51 y.o. male who presents for preoperative history and physical with a diagnosis of left knee loose body osteochondral fragment, chondromalacia patella. Symptoms are rated as moderate to severe, and have been worsening.  This is significantly impairing activities of daily living.  He has elected for surgical management.   Past Medical History  Diagnosis Date  . Allergy   . Depression   . Diabetes mellitus, type 2   . Hyperlipidemia   . Hypertension   . Arthritis   . Anemia     hx iron deficiency   . ADD (attention deficit disorder)   . OSA (obstructive sleep apnea)     mild not on cpap   Past Surgical History  Procedure Date  . Rhinoplasty   . Knee arthroscopy     x3  . Knee arthroscopy     rt  . Hand tendon surgery     left   History   Social History  . Marital Status: Married    Spouse Name: N/A    Number of Children: N/A  . Years of Education: N/A   Social History Main Topics  . Smoking status: Never Smoker   . Smokeless tobacco: None  . Alcohol Use: Yes  . Drug Use: No  . Sexually Active: None   Other Topics Concern  . 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 Colon polyps Mother   . Heart disease Father     63s   . Cancer Maternal Grandfather     colon  . Diabetes Paternal Grandmother   . Seizures Daughter   . Thyroid disease Daughter   . Aneurysm Father     AAA smoker  . ADD / ADHD Son   . Learning disabilities Son    Allergies  Allergen Reactions  . Horse-Derived Products     REACTION: UNKNOWN   Prior to Admission medications   Medication Sig Start Date End Date Taking? Authorizing Provider  aspirin 81 MG chewable tablet Chew 81 mg by mouth daily.     Yes Historical Provider, MD  atorvastatin  (LIPITOR) 10 MG tablet Take 10 mg by mouth daily.     Yes Historical Provider, MD  benazepril (LOTENSIN) 10 MG tablet Take 10 mg by mouth daily.     Yes Historical Provider, MD  ezetimibe (ZETIA) 10 MG tablet Take 10 mg by mouth daily.     Yes Historical Provider, MD  fish oil-omega-3 fatty acids 1000 MG capsule Take 2 g by mouth daily.     Yes Historical Provider, MD  glucosamine-chondroitin 500-400 MG tablet Take 1 tablet by mouth 3 (three) times daily.     Yes Historical Provider, MD  insulin glargine (LANTUS) 100 UNIT/ML injection Inject 40 Units into the skin every morning.    Yes Veverly Fells Altheimer, MD  Liraglutide (VICTOZA) 18 MG/3ML SOLN Inject into the skin.     Yes Veverly Fells Altheimer, MD  metFORMIN (GLUCOPHAGE) 500 MG tablet Take 500 mg by mouth 2 (two) times daily with a meal. 2 qam and 3 qhs    Yes Veverly Fells Altheimer, MD  pioglitazone (ACTOS) 45 MG tablet Take 45 mg by mouth daily.    Yes Junious Silk, MD  Positive ROS: All other systems have been reviewed and were otherwise negative with the exception of those mentioned in the HPI and as above.  Physical Exam: General: Alert, no acute distress Cardiovascular: No pedal edema Respiratory: No cyanosis, no use of accessory musculature GI: No organomegaly, abdomen is soft and non-tender Skin: No lesions in the area of chief complaint Neurologic: Sensation intact distally Psychiatric: Patient is competent for consent with normal mood and affect Lymphatic: No axillary or cervical lymphadenopathy  MUSCULOSKELETAL: left knee with diffuse pain, crepitance, minimal effusion  Assessment: left knee loose body osteochondral fragment, chondromalacia patella  Plan: Plan for Procedure(s): ARTHROSCOPY KNEE, loose body removal, possible microfracture  The risks benefits and alternatives were discussed with the patient including but not limited to the risks of nonoperative treatment, versus surgical intervention including  infection, bleeding, nerve injury,  blood clots, cardiopulmonary complications, morbidity, mortality, among others, and they were willing to proceed.   Stacy Deshler P, MD Cell [phone] Pager [phone]  06/27/2012 7:32 AM

## 2012-06-27 NOTE — Transfer of Care (Signed)
Immediate Anesthesia Transfer of Care Note  Patient: ROME ECHAVARRIA  Procedure(s) Performed: Procedure(s) (LRB) with comments: ARTHROSCOPY KNEE (Left) - left knee arthroscopy with removal loose foreign body , with debridement/shaving( chondroplasty)  Patient Location: PACU  Anesthesia Type:General  Level of Consciousness: awake, alert , oriented and patient cooperative  Airway & Oxygen Therapy: Patient Spontanous Breathing and Patient connected to face mask oxygen  Post-op Assessment: Report given to PACU RN and Post -op Vital signs reviewed and stable  Post vital signs: Reviewed and stable  Complications: No apparent anesthesia complications

## 2012-06-27 NOTE — Anesthesia Postprocedure Evaluation (Signed)
  Anesthesia Post-op Note  Patient: Rickey Wright  Procedure(s) Performed: Procedure(s) (LRB) with comments: ARTHROSCOPY KNEE (Left) - left knee arthroscopy with removal loose foreign body , with debridement/shaving( chondroplasty)  Patient Location: PACU  Anesthesia Type:General  Level of Consciousness: awake, alert  and oriented  Airway and Oxygen Therapy: Patient Spontanous Breathing and Patient connected to face mask oxygen  Post-op Pain: mild  Post-op Assessment: Post-op Vital signs reviewed  Post-op Vital Signs: Reviewed  Complications: No apparent anesthesia complications

## 2012-06-27 NOTE — Op Note (Signed)
06/27/2012  9:03 AM  PATIENT:  Rickey Wright    PRE-OPERATIVE DIAGNOSIS:  left knee loose body osteochondral fragment, chondromalacia patella  POST-OPERATIVE DIAGNOSIS:  Left knee loose body, multiple, with osteochondral fragments, with ongoing pain grade 4 chondral loss of the patellofemoral joint, osteophyte formation on the medial femoral condyle, small grade 4 lesion on the lateral femoral condyle, extensive grade 3 lesion on the medial femoral condyle.  PROCEDURE:  Left knee arthroscopy with removal of multiple loose bodies and debridement of synovium.  SURGEON:  Eulas Post, MD  PHYSICIAN ASSISTANT: Janace Litten, OPA-C, present and scrubbed throughout the case, critical for completion in a timely fashion, and for instrumentation, and closure.  ANESTHESIA:   General  PREOPERATIVE INDICATIONS:  Rickey Wright is a  51 y.o. male with a diagnosis of left knee loose body osteochondral fragment, chondromalacia patella who failed conservative measures and elected for surgical management.    The risks benefits and alternatives were discussed with the patient preoperatively including but not limited to the risks of infection, bleeding, nerve injury, cardiopulmonary complications, the need for revision surgery, among others, and the patient was willing to proceed. We also discussed the risks of recurrent pain, progression of any arthritis, need for future surgical intervention.  OPERATIVE IMPLANTS: None  OPERATIVE FINDINGS: The patellofemoral joint had grade 2 changes underneath the patella, but an extensive uncontained grade 4 lesion that was not amenable to microfracture underneath the femoral trochlea on the medial side. There was extensive osteophyte formation on the notch, but this was stable. There were multiple loose bodies that were present in the notch at the anterior fat pad, anterior to the anterior cruciate ligament. The medial and lateral meniscus were all intact. The medial  and lateral gutters were clear. I did not appreciate any additional loose bodies in the medial compartment, after all the loose bodies from the front of the knee were removed. The knee was extremely tight, and navigation fairly difficult. Due to the osteophyte formation I was not able to get access to the posterior knee behind the anterior cruciate ligament and PCL.  OPERATIVE PROCEDURE: The patient is brought to the operating room and placed in the supine position. General anesthesia was administered. IV antibiotics were given. The lower extremity was prepped and draped in usual sterile fashion. Diagnostic arthroscopy was carried out with the above-named findings after time out was performed. I used uroscopic shaver and the arthroscopic grasper as well as the ArthroCare to expose the multiple loose bodies, and then remove them. I had at least 3 or 4 pieces that were approximately 1 x 1 cm in size. These were removed. There was also is some fragmentation of the lateral femoral condyle, which was debrided with the shaver. The trochlea was also debrided of any loose pieces of cartilage, although there was a substantial amount of cartilage loss in this location. The medial femoral condyle also had some loose chondral debris which was debrided.  The instruments were then removed and the knee drained and injected and the portals closed with Monocryl followed by Steri-Strips and sterile gauze. He was awakened and returned to the PACU in stable and satisfactory condition. There were no complications.

## 2012-06-30 ENCOUNTER — Encounter (HOSPITAL_BASED_OUTPATIENT_CLINIC_OR_DEPARTMENT_OTHER): Payer: Self-pay | Admitting: Orthopedic Surgery

## 2012-10-17 LAB — LIPID PANEL: LDL CALC: 68 mg/dL

## 2012-10-17 LAB — HEMOGLOBIN A1C: HEMOGLOBIN A1C: 7.1 % — AB (ref 4.0–6.0)

## 2012-12-03 ENCOUNTER — Encounter: Payer: Self-pay | Admitting: Internal Medicine

## 2012-12-03 ENCOUNTER — Ambulatory Visit (INDEPENDENT_AMBULATORY_CARE_PROVIDER_SITE_OTHER): Payer: BC Managed Care – PPO | Admitting: Internal Medicine

## 2012-12-03 VITALS — BP 118/68 | HR 80 | Temp 98.8°F | Wt 253.0 lb

## 2012-12-03 DIAGNOSIS — J019 Acute sinusitis, unspecified: Secondary | ICD-10-CM

## 2012-12-03 DIAGNOSIS — J329 Chronic sinusitis, unspecified: Secondary | ICD-10-CM

## 2012-12-03 DIAGNOSIS — J0191 Acute recurrent sinusitis, unspecified: Secondary | ICD-10-CM

## 2012-12-03 DIAGNOSIS — R062 Wheezing: Secondary | ICD-10-CM

## 2012-12-03 MED ORDER — ALBUTEROL SULFATE HFA 108 (90 BASE) MCG/ACT IN AERS: 2.0000 | INHALATION_SPRAY | Freq: Four times a day (QID) | RESPIRATORY_TRACT | Status: DC | PRN

## 2012-12-03 MED ORDER — AMOXICILLIN-POT CLAVULANATE 875-125 MG PO TABS: 1.0000 | ORAL_TABLET | Freq: Two times a day (BID) | ORAL | Status: DC

## 2012-12-03 MED ORDER — FLUTICASONE PROPIONATE 50 MCG/ACT NA SUSP: NASAL | Status: DC

## 2012-12-03 NOTE — Patient Instructions (Addendum)
Add  Inhaler . As needed.   Antibiotic also .  For now .   Can arrange for    Preventive visit as possible.  consider getting twin rix vaccine.   Series for hepatitis .

## 2012-12-03 NOTE — Progress Notes (Signed)
Chief Complaint  Patient presents with  . Nasal Congestion    Nasal congestion is green or grey.  Started 8-9 days ago.  Taking Claritin D bid and Afrin mid day.  . Cough  . Sinus Pressure  . Chest tightness    HPI: Patient comes in today for SDA for  new problem evaluation. He has a known history of recurrent sinusitis and allergic rhinitis. Has been having problems over the last 8-9 days possibly triggered by allergy. Nasal congestion drip facial pressure ears popping and now coughing  Seems like like allergy   At beginning and now continueing and woresning and green drainage  POst nasal drainage  Doing irrigation still and not enough.     ocass face and   Pain taking calritin d  In bed   And afrin.  For 3 days  Not on flonase Tight cough  . But no shortness of breath no diagnosis of asthma but we did an x-ray last year because of some wheezing.  ROS: See pertinent positives and negatives per HPI. No chest pain does note exertional dyspnea at times but thinks it's deconditioning. He is under care for his diabetes asks about some preventive measures.   He states that his A1c is in good control in the sixes range denies any specific complications at this time he is on Lantus metformin victoza Actos. He is also on Lotensin for hypertension. His not knowing to have an ACE cough  Past Medical History  Diagnosis Date  . Allergy   . Depression   . Diabetes mellitus, type 2   . Hyperlipidemia   . Hypertension   . Arthritis   . Anemia     hx iron deficiency   . ADD (attention deficit disorder)   . OSA (obstructive sleep apnea)     mild not on cpap  . Loose body of left knee 06/27/2012  . Localized osteoarthritis of left knee 06/27/2012    Family History  Problem Relation Age of Onset  . Colon polyps Mother   . Heart disease Father     57s   . Cancer Maternal Grandfather     colon  . Diabetes Paternal Grandmother   . Seizures Daughter   . Thyroid disease Daughter   . Aneurysm  Father     AAA smoker  . ADD / ADHD Son   . Learning disabilities Son     History   Social History  . Marital Status: Married    Spouse Name: N/A    Number of Children: N/A  . Years of Education: N/A   Social History Main Topics  . Smoking status: Never Smoker   . Smokeless tobacco: None  . Alcohol Use: Yes  . Drug Use: No  . Sexually Active: None   Other Topics Concern  . 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 aspirin 81 MG chewable tablet Chew 81 mg by mouth daily.        [name] atorvastatin (LIPITOR) 10 MG tablet Take 10 mg by mouth daily.        . benazepril (LOTENSIN) 10 MG tablet Take 10 mg by mouth daily.        [name] ezetimibe (ZETIA) 10 MG tablet Take 10 mg by mouth daily.        [name]  fish oil-omega-3 fatty acids 1000 MG capsule Take 2 g by mouth daily.        [name] glucosamine-chondroitin 500-400 MG tablet Take 1 tablet by mouth 3 (three) times daily.        . insulin glargine (LANTUS) 100 UNIT/ML injection Inject 40 Units into the skin every morning.       . Liraglutide (VICTOZA) 18 MG/3ML SOLN Inject into the skin.        . metFORMIN (GLUCOPHAGE) 500 MG tablet Take 500 mg by mouth 2 (two) times daily with a meal. 2 qam and 3 qhs       . pioglitazone (ACTOS) 45 MG tablet Take 45 mg by mouth daily.       [name] albuterol (PROAIR HFA) 108 (90 BASE) MCG/ACT inhaler Inhale 2 puffs into the lungs every 6 (six) hours as needed for wheezing.  1 Inhaler  0  . amoxicillin-clavulanate (AUGMENTIN) 875-125 MG per tablet Take 1 tablet by mouth every 12 (twelve) hours.  20 tablet  0  . fluticasone (FLONASE) 50 MCG/ACT nasal spray 2 spray each nostril qd  16 g  3  . [DISCONTINUED] ketorolac (TORADOL) 10 MG tablet Take 1 tablet (10 mg total) by mouth every 6 (six) hours as needed for pain.  20 tablet  0  . [DISCONTINUED]  oxyCODONE-acetaminophen (PERCOCET) 10-325 MG per tablet Take 1-2 tablets by mouth every 6 (six) hours as needed for pain. MAXIMUM TOTAL ACETAMINOPHEN DOSE IS 4000 MG PER DAY  75 tablet  0  . [DISCONTINUED] promethazine (PHENERGAN) 25 MG tablet Take 1 tablet (25 mg total) by mouth every 6 (six) hours as needed for nausea.  30 tablet  0   No facility-administered encounter medications on file as of 12/03/2012.    EXAM:  BP 118/68  Pulse 80  Temp(Src) 98.8 F (37.1 C) (Oral)  Wt 253 lb (114.76 kg)  BMI 36.3 kg/m2  SpO2 97%  Body mass index is 36.3 kg/(m^2).  GENERAL: vitals reviewed and listed above, alert, oriented, appears well hydrated and in no acute distress he does have upper respiratory congestion he is nontoxic looks a bit allergic  HEENT: atraumatic, conjunctiva  clear, no obvious abnormalities on inspection of external nose and ears TMs are intact nares congested face minimally tender maxillary not frontal OP : no lesion edema or exudate  Palate  NECK: no obvious masses on inspection palpation no adenopathy  LUNGS:  Sounds equal possibly decreased a rare musical wheeze no rales or rhonchi  CV: HRRR, no clubbing cyanosis or  peripheral edema nl cap refill   MS: moves all extremities without noticeable focal  abnormality  PSYCH: pleasant and cooperative, no obvious depression or anxiety  ASSESSMENT AND PLAN:  Discussed the following assessment and plan:  Recurrent acute sinusitis  Chronic recurrent sinusitis  Wheezing - Secondary to upper respiratory symptoms add albuterol as needed treat sinusitis Acute on chronic  Advise restart the Flonase in season -Patient advised to return or notify health care team  if symptoms worsen or persist or new concerns arise.  Patient Instructions  Add  Inhaler . As needed.   Antibiotic also .  For now .   Can arrange for    Preventive visit as possible.  consider getting twin rix vaccine.   Series for hepatitis .        Neta Mends. Panosh M.D.

## 2012-12-16 ENCOUNTER — Ambulatory Visit (INDEPENDENT_AMBULATORY_CARE_PROVIDER_SITE_OTHER): Payer: BC Managed Care – PPO | Admitting: Internal Medicine

## 2012-12-16 ENCOUNTER — Encounter: Payer: Self-pay | Admitting: Internal Medicine

## 2012-12-16 VITALS — BP 126/84 | HR 87 | Temp 99.0°F | Wt 255.0 lb

## 2012-12-16 DIAGNOSIS — H02849 Edema of unspecified eye, unspecified eyelid: Secondary | ICD-10-CM

## 2012-12-16 DIAGNOSIS — H02846 Edema of left eye, unspecified eyelid: Secondary | ICD-10-CM

## 2012-12-16 DIAGNOSIS — H109 Unspecified conjunctivitis: Secondary | ICD-10-CM

## 2012-12-16 MED ORDER — DOXYCYCLINE HYCLATE 100 MG PO CAPS: 100.0000 mg | ORAL_CAPSULE | Freq: Two times a day (BID) | ORAL | Status: DC

## 2012-12-16 MED ORDER — CIPROFLOXACIN HCL 0.3 % OP SOLN: OPHTHALMIC | Status: DC

## 2012-12-16 NOTE — Patient Instructions (Addendum)
This appears to be an eye infection of the lining of the lid  .   Can be viral but concerning that this is a bacterial infection.     Warm compresses  To eye .  And   add antibiotic drops. If you're getting increasing swelling around the eye a lid or worsening begin the oral antibiotics and we should arrange for you to see an eye doctor if there is any rashes around on a like a cold sore rash that we need to to see an eye doctor

## 2012-12-16 NOTE — Progress Notes (Signed)
Chief Complaint  Patient presents with  . Eye Pain    Started today.  The eye is red and is draining.    HPI: Patient comes in today for SDA for  new problem evaluation. Pleaded his Augmentin a few days ago when his sinuses have done very well. However this morning he had some irritation in his left eye and noticed that he had a great film over it was having a hard time seeing past it cleaning his glasses and then noted he had significant amount of puslike discharge from his thigh. His ears he did but not photophobic and has not had this problem before no fever foreign body history. Not wearing contacts.  Does have allergies but this is not itchy. Symptoms got worse over the day and thought he would come in early to be checked no exposure to pinkeye as well as he knows ROS: See pertinent positives and negatives per HPI.  Past Medical History  Diagnosis Date  . Allergy   . Depression   . Diabetes mellitus, type 2   . Hyperlipidemia   . Hypertension   . Arthritis   . Anemia     hx iron deficiency   . ADD (attention deficit disorder)   . OSA (obstructive sleep apnea)     mild not on cpap  . Loose body of left knee 06/27/2012  . Localized osteoarthritis of left knee 06/27/2012    Family History  Problem Relation Age of Onset  . Colon polyps Mother   . Heart disease Father     81s   . Cancer Maternal Grandfather     colon  . Diabetes Paternal Grandmother   . Seizures Daughter   . Thyroid disease Daughter   . Aneurysm Father     AAA smoker  . ADD / ADHD Son   . Learning disabilities Son     History   Social History  . Marital Status: Married    Spouse Name: N/A    Number of Children: N/A  . Years of Education: N/A   Social History Main Topics  . Smoking status: Never Smoker   . Smokeless tobacco: Not on file  . Alcohol Use: Yes  . Drug Use: No  . Sexually Active: Not on file   Other Topics Concern  . 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 albuterol (PROAIR HFA) 108 (90 BASE) MCG/ACT inhaler Inhale 2 puffs into the lungs every 6 (six) hours as needed for wheezing.  1 Inhaler  0  . aspirin 81 MG chewable tablet Chew 81 mg by mouth daily.        [name] atorvastatin (LIPITOR) 10 MG tablet Take 10 mg by mouth daily.        . benazepril (LOTENSIN) 10 MG tablet Take 10 mg by mouth daily.        [name] ezetimibe (ZETIA) 10 MG tablet Take 10 mg by mouth daily.        . fish oil-omega-3 fatty acids 1000 MG capsule Take 2 g by mouth daily.        . fluticasone (FLONASE) 50 MCG/ACT nasal spray 2 spray each nostril qd  16 g  3  . glucosamine-chondroitin 500-400 MG tablet Take 1 tablet by mouth  3 (three) times daily.        . insulin glargine (LANTUS) 100 UNIT/ML injection Inject 40 Units into the skin every morning.       . Liraglutide (VICTOZA) 18 MG/3ML SOLN Inject into the skin.        . metFORMIN (GLUCOPHAGE) 500 MG tablet Take 500 mg by mouth 2 (two) times daily with a meal. 2 qam and 3 qhs       . pioglitazone (ACTOS) 45 MG tablet Take 45 mg by mouth daily.       . [DISCONTINUED] amoxicillin-clavulanate (AUGMENTIN) 875-125 MG per tablet Take 1 tablet by mouth every 12 (twelve) hours.  20 tablet  0  . ciprofloxacin (CILOXAN) 0.3 % ophthalmic solution Administer 1 drop, every 4 hours, while awake, for the next 5 days.  5 mL  0  . doxycycline (VIBRAMYCIN) 100 MG capsule Take 1 capsule (100 mg total) by mouth 2 (two) times daily.  14 capsule  0   No facility-administered encounter medications on file as of 12/16/2012.    EXAM:  BP 126/84  Pulse 87  Temp(Src) 99 F (37.2 C) (Oral)  Wt 255 lb (115.667 kg)  BMI 36.59 kg/m2  SpO2 97%  Body mass index is 36.59 kg/(m^2).  GENERAL: vitals reviewed and listed above, alert, oriented, appears well hydrated and in no acute distress with obvious  left medial red eyelid  HEENT: atraumatic, conjunctiva  left mid medial moderately red no foreign body no ulcer there swelling and pinkness of the medial eyelid left drainage clear to yellow near the lacrimal duct. No significant tenderness on palpation. PERRLA without photophobia EOMs are normal clear, no obvious abnormalities on inspection of external nose and ears TMs intact OP : no lesion edema or exudate   NECK: no obvious masses on inspection palpation   PSYCH: pleasant and cooperative, no obvious depression or anxiety  ASSESSMENT AND PLAN:  Discussed the following assessment and plan:  Conjunctivitis unspecified - Left  Swelling of eyelid, left Uncertain cause but this is very early. He has a little more eyelid swelling than I would expect for simple conjunctivitis. But no tenderness at the nasal lacrimal duct.   Warm compresses and Cipro eyedrops can add oral antibiotics if needed cautioned discussion of looking out for signs of herpetic shingles-type infection if any rash or vesicles should see eye doctor immediately. However because he has no photophobia at this time it is probably not as likely .  Sinusitis is resolved -Patient advised to return or notify health care team  if symptoms worsen or persist or new concerns arise.  Patient Instructions  This appears to be an eye infection of the lining of the lid  .   Can be viral but concerning that this is a bacterial infection.     Warm compresses  To eye .  And   add antibiotic drops. If you're getting increasing swelling around the eye a lid or worsening begin the oral antibiotics and we should arrange for you to see an eye doctor if there is any rashes around on a like a cold sore rash that we need to to see an eye doctor     Neta Mends. Makiyah Zentz M.D.

## 2013-01-25 IMAGING — CR DG CHEST 2V
2 series · 2 of 2 positions shown · non-contrast
Comparison: 10/18/2009

CLINICAL DATA: Cough

CHEST - 2 VIEW

[view not recorded (1 of 2)]
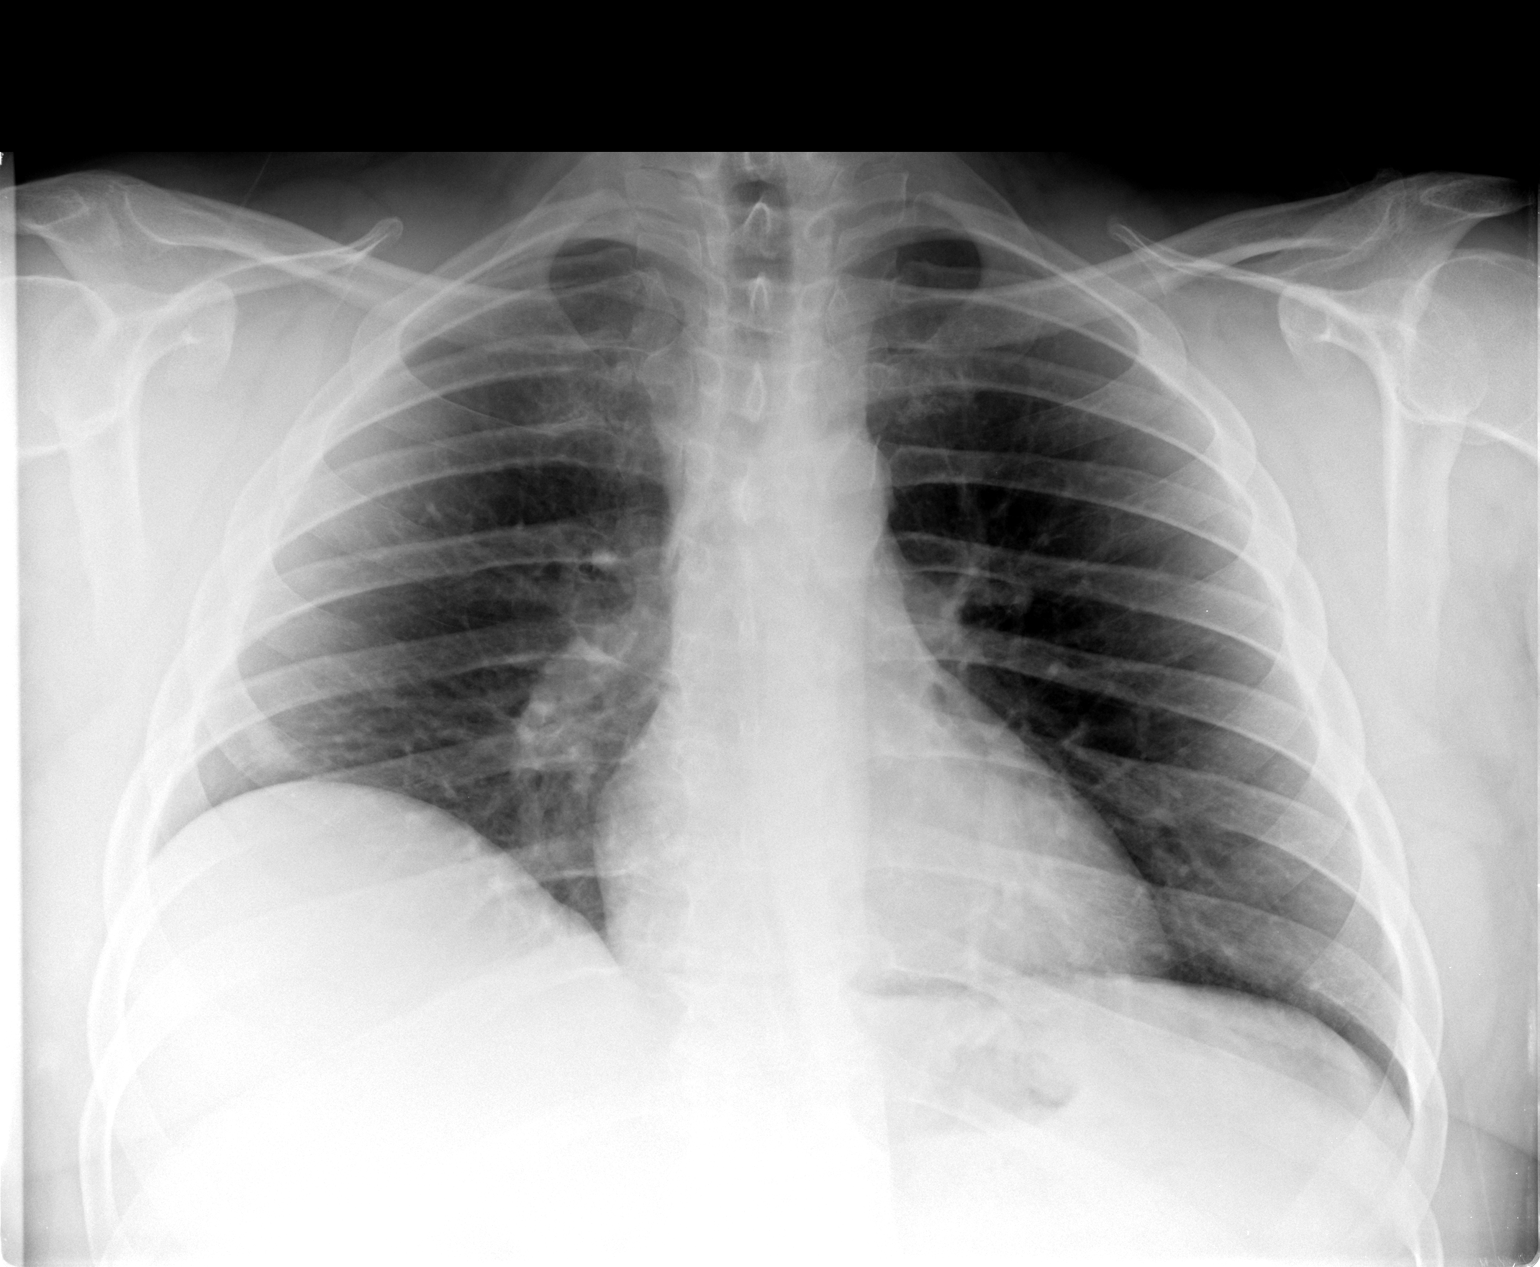

[view not recorded (2 of 2)]
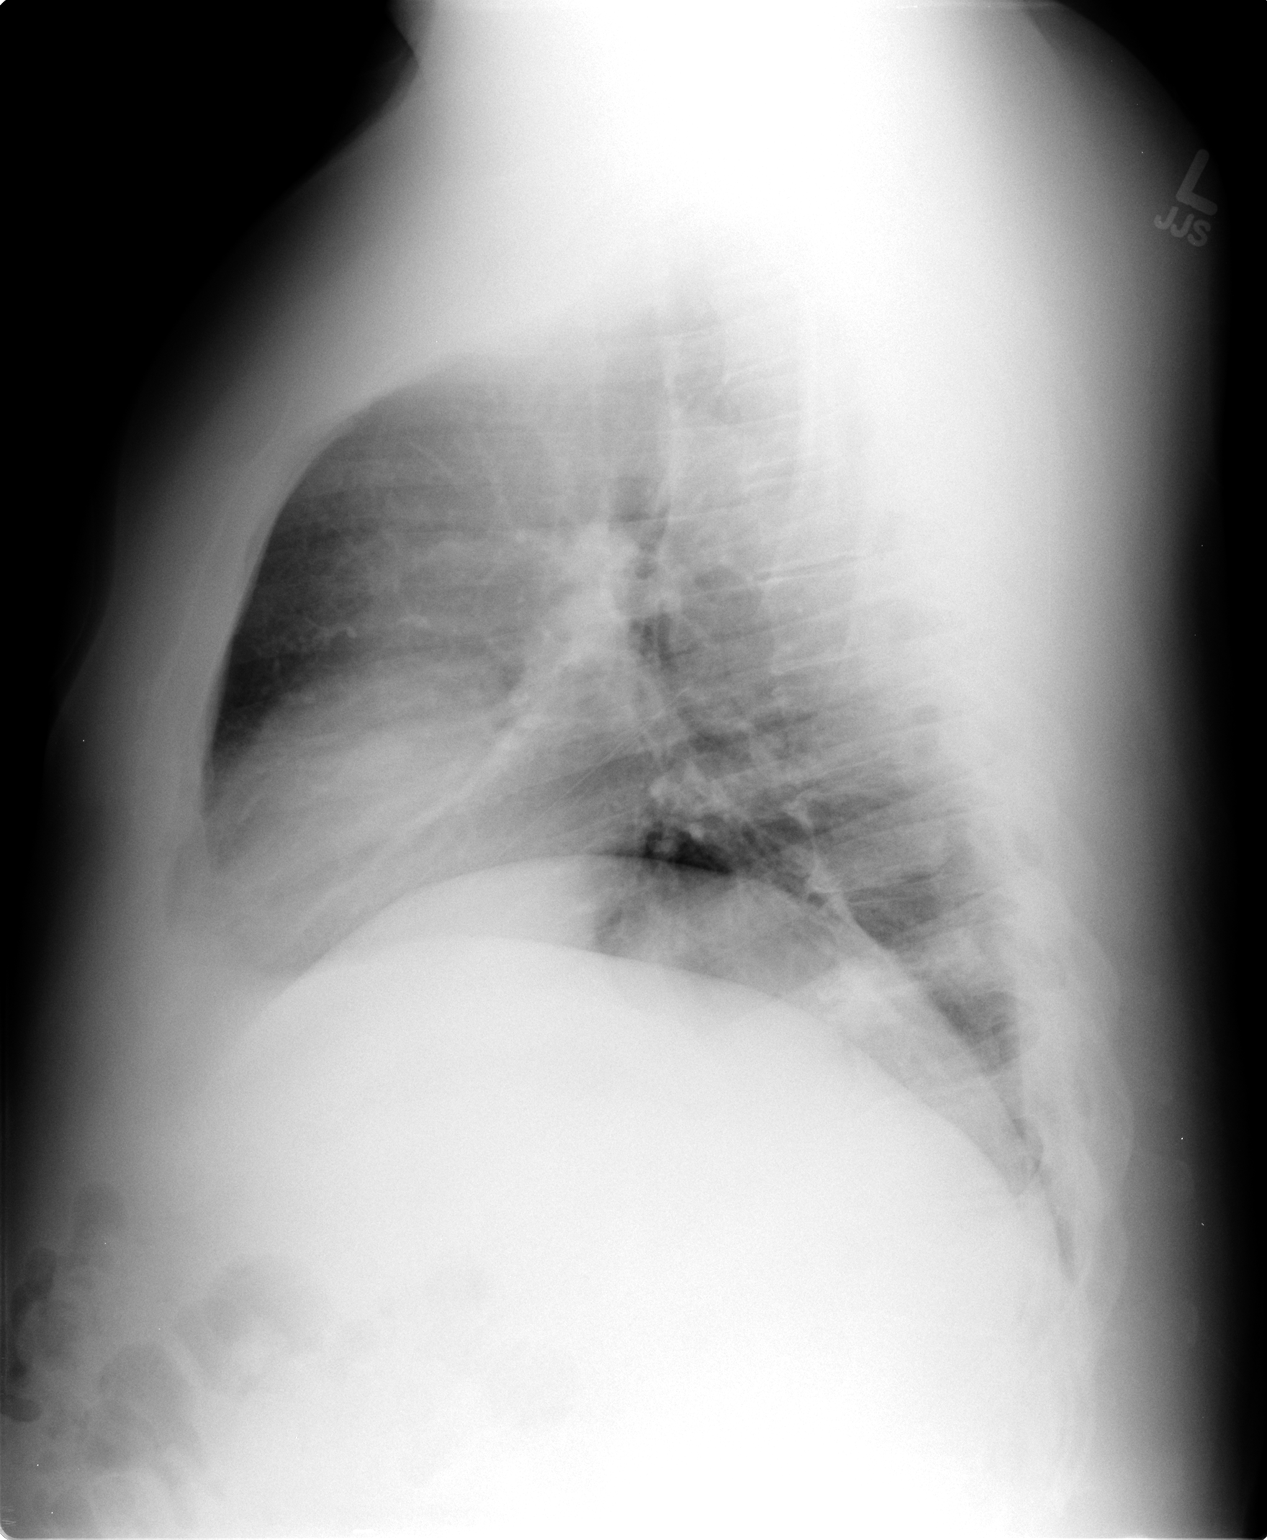

[2 of 2 positions shown; findings below may reference images not displayed]

FINDINGS: The heart size and mediastinal contours are within normal
limits. There is asymmetric elevation of the right hemidiaphragm.
Both lungs are clear.  The visualized skeletal structures are
unremarkable.
IMPRESSION: Negative exam.

## 2013-04-27 ENCOUNTER — Encounter: Payer: Self-pay | Admitting: Family Medicine

## 2013-04-27 ENCOUNTER — Ambulatory Visit (INDEPENDENT_AMBULATORY_CARE_PROVIDER_SITE_OTHER): Payer: BC Managed Care – PPO | Admitting: Family Medicine

## 2013-04-27 VITALS — BP 110/68 | Temp 98.7°F | Wt 247.0 lb

## 2013-04-27 DIAGNOSIS — J329 Chronic sinusitis, unspecified: Secondary | ICD-10-CM

## 2013-04-27 DIAGNOSIS — Z23 Encounter for immunization: Secondary | ICD-10-CM

## 2013-04-27 MED ORDER — AMOXICILLIN-POT CLAVULANATE 875-125 MG PO TABS: 1.0000 | ORAL_TABLET | Freq: Two times a day (BID) | ORAL | Status: DC

## 2013-04-27 NOTE — Progress Notes (Signed)
Chief Complaint  Patient presents with  . Sinusitis    HPI:  Acute visit for sinus congestion: -started about 1.5 weeks ago -symptoms: nasal congestion, sinus pressure and pain R maxillary more, drainage, cough -denies: fevers, chills, NVD, SOB -has tried nettie pot, OTC stuff -hx of recurrent sinusitis and sinus surgery  ROS: See pertinent positives and negatives per HPI.  Past Medical History  Diagnosis Date  . Allergy   . Depression   . Diabetes mellitus, type 2   . Hyperlipidemia   . Hypertension   . Arthritis   . Anemia     hx iron deficiency   . ADD (attention deficit disorder)   . OSA (obstructive sleep apnea)     mild not on cpap  . Loose body of left knee 06/27/2012  . Localized osteoarthritis of left knee 06/27/2012    Past Surgical History  Procedure Laterality Date  . Rhinoplasty    . Knee arthroscopy      x3  . Knee arthroscopy      rt  . Hand tendon surgery      left  . Knee arthroscopy  06/27/2012    Procedure: ARTHROSCOPY KNEE;  Surgeon: Eulas Post, MD;  Location: [facility];  Service: Orthopedics;  Laterality: Left;  left knee arthroscopy with removal loose foreign body , with debridement/shaving( chondroplasty)    Family History  Problem Relation Age of Onset  . Colon polyps Mother   . Heart disease Father     37s   . Cancer Maternal Grandfather     colon  . Diabetes Paternal Grandmother   . Seizures Daughter   . Thyroid disease Daughter   . Aneurysm Father     AAA smoker  . ADD / ADHD Son   . Learning disabilities Son     History   Social History  . Marital Status: Married    Spouse Name: N/A    Number of Children: N/A  . Years of Education: N/A   Social History Main Topics  . Smoking status: Never Smoker   . Smokeless tobacco: None  . Alcohol Use: Yes  . Drug Use: No  . Sexual Activity: None   Other Topics Concern  . 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  , Disp: , Rfl: ;  atorvastatin (LIPITOR) 10 MG tablet, Take 10 mg by mouth daily.  , Disp: , Rfl: ;  benazepril (LOTENSIN) 10 MG tablet, Take 10 mg by mouth daily.  , Disp: , Rfl:  ciprofloxacin (CILOXAN) 0.3 % ophthalmic solution, Administer 1 drop, every 4 hours, while awake, for the next 5 days., Disp: 5 mL, Rfl: 0;  doxycycline (VIBRAMYCIN) 100 MG capsule, Take 1 capsule (100 mg total) by mouth 2 (two) times daily., Disp: 14 capsule, Rfl: 0;  ezetimibe (ZETIA) 10 MG tablet, Take 10 mg by mouth daily.  , Disp: , Rfl: ;  fish oil-omega-3 fatty acids 1000 MG capsule, Take 2 g by mouth daily.  , Disp: , Rfl:  fluticasone (FLONASE) 50 MCG/ACT nasal spray, 2 spray each nostril qd, Disp: 16 g,  Rfl: 3;  glucosamine-chondroitin 500-400 MG tablet, Take 1 tablet by mouth 3 (three) times daily.  , Disp: , Rfl: ;  insulin glargine (LANTUS) 100 UNIT/ML injection, Inject 40 Units into the skin every morning. , Disp: , Rfl: ;  Liraglutide (VICTOZA) 18 MG/3ML SOLN, Inject into the skin.  , Disp: , Rfl:  metFORMIN (GLUCOPHAGE) 500 MG tablet, Take 500 mg by mouth 2 (two) times daily with a meal. 2 qam and 3 qhs , Disp: , Rfl: ;  pioglitazone (ACTOS) 45 MG tablet, Take 45 mg by mouth daily. , Disp: , Rfl: ;  amoxicillin-clavulanate (AUGMENTIN) 875-125 MG per tablet, Take 1 tablet by mouth 2 (two) times daily., Disp: 20 tablet, Rfl: 0  EXAM:  Filed Vitals:   04/27/13 1049  BP: 110/68  Temp: 98.7 F (37.1 C)    Body mass index is 35.44 kg/(m^2).  GENERAL: vitals reviewed and listed above, alert, oriented, appears well hydrated and in no acute distress  HEENT: atraumatic, conjunttiva clear, no obvious abnormalities on inspection of  external nose and ears, normal appearance of ear canals and TMs, white nasal congestion, mild post oropharyngeal erythema with PND, no tonsillar edema or exudate, no sinus TTP  NECK: no obvious masses on inspection  LUNGS: clear to auscultation bilaterally, no wheezes, rales or rhonchi, good air movement  CV: HRRR, no peripheral edema  MS: moves all extremities without noticeable abnormality  PSYCH: pleasant and cooperative, no obvious depression or anxiety  ASSESSMENT AND PLAN:  Discussed the following assessment and plan:  Need for prophylactic vaccination and inoculation against influenza - Plan: Flu Vaccine QUAD 36+ mos PF IM (Fluarix)  Sinusitis - Plan: amoxicillin-clavulanate (AUGMENTIN) 875-125 MG per tablet  -Recommendations per orders and instructions, risks and use of medications and return precautions discussed. -Patient advised to return or notify a doctor immediately if symptoms worsen or persist or new concerns arise.  There are no Patient Instructions on file for this visit.   Rickey Basque R.

## 2013-04-27 NOTE — Patient Instructions (Addendum)
INSTRUCTIONS FOR UPPER RESPIRATORY INFECTION:  -plenty of rest and fluids  -As we discussed, we have prescribed a new medication  (AUGMENTIN)for you at this appointment. We discussed the common and serious potential adverse effects of this medication and you can review these and more with the pharmacist when you pick up your medication.  Please follow the instructions for use carefully and notify us immediately if you have any problems taking this medication.  -nasal saline wash 2-3 times daily (use prepackaged nasal saline or bottled/distilled water if making your own)   -clean nose with nasal saline before using the nasal steroid or sinex  -can use sinex nasal spray for drainage and nasal congestion - but do NOT use longer then 3-4 days  -can use tylenol or ibuprofen as directed for aches and sorethroat  -if you are taking a cough medication - use only as directed, may also try a teaspoon of honey to coat the throat and throat lozenges  -for sore throat, salt water gargles can help  -follow up if you have fevers, facial pain, tooth pain, difficulty breathing or are worsening or not getting better in 5-7 days

## 2013-09-19 ENCOUNTER — Encounter: Payer: Self-pay | Admitting: Nurse Practitioner

## 2013-09-19 ENCOUNTER — Ambulatory Visit (INDEPENDENT_AMBULATORY_CARE_PROVIDER_SITE_OTHER): Payer: BC Managed Care – PPO | Admitting: Nurse Practitioner

## 2013-09-19 VITALS — BP 114/80 | HR 92 | Temp 98.4°F | Resp 18 | Wt 252.5 lb

## 2013-09-19 DIAGNOSIS — J189 Pneumonia, unspecified organism: Secondary | ICD-10-CM

## 2013-09-19 MED ORDER — DOXYCYCLINE HYCLATE 100 MG PO TABS
100.0000 mg | ORAL_TABLET | Freq: Two times a day (BID) | ORAL | Status: DC
Start: 2013-09-19 — End: 2014-05-14

## 2013-09-19 MED ORDER — GUAIFENESIN 400 MG PO TABS: ORAL_TABLET | ORAL | Status: DC

## 2013-09-19 MED ORDER — ALBUTEROL SULFATE HFA 108 (90 BASE) MCG/ACT IN AERS: INHALATION_SPRAY | RESPIRATORY_TRACT | Status: DC

## 2013-09-19 NOTE — Progress Notes (Signed)
Pre visit review using our clinic review tool, if applicable. No additional management support is needed unless otherwise documented below in the visit note. 

## 2013-09-19 NOTE — Patient Instructions (Signed)
I am treating you for pneumonia. Please start antibiotic. Eat yogurt daily, 2 hours after or before you take antibiotic, to help prevent diarrhea. Take humibid daily to help break up congestion. Continue to use inhalers as prescribed. Perform breathing/cough exercises 3-4 times daily-blow up balloon. Sip fluids every hour. Rest. Follow up with Dr  Regis Bill 2 weeks or sooner if you feel worse.   Pneumonia, Adult Pneumonia is an infection of the lungs.  CAUSES Pneumonia may be caused by bacteria or a virus. Usually, these infections are caused by breathing infectious particles into the lungs (respiratory tract). SYMPTOMS   Cough.  Fever.  Chest pain.  Increased rate of breathing.  Wheezing.  Mucus production. DIAGNOSIS  If you have the common symptoms of pneumonia, your caregiver will typically confirm the diagnosis with a chest X-ray. The X-ray will show an abnormality in the lung (pulmonary infiltrate) if you have pneumonia. Other tests of your blood, urine, or sputum may be done to find the specific cause of your pneumonia. Your caregiver may also do tests (blood gases or pulse oximetry) to see how well your lungs are working. TREATMENT  Some forms of pneumonia may be spread to other people when you cough or sneeze. You may be asked to wear a mask before and during your exam. Pneumonia that is caused by bacteria is treated with antibiotic medicine. Pneumonia that is caused by the influenza virus may be treated with an antiviral medicine. Most other viral infections must run their course. These infections will not respond to antibiotics.  PREVENTION A pneumococcal shot (vaccine) is available to prevent a common bacterial cause of pneumonia. This is usually suggested for:  People over 26 years old.  Patients on chemotherapy.  People with chronic lung problems, such as bronchitis or emphysema.  People with immune system problems. If you are over 65 or have a high risk condition, you may  receive the pneumococcal vaccine if you have not received it before. In some countries, a routine influenza vaccine is also recommended. This vaccine can help prevent some cases of pneumonia.You may be offered the influenza vaccine as part of your care. If you smoke, it is time to quit. You may receive instructions on how to stop smoking. Your caregiver can provide medicines and counseling to help you quit. HOME CARE INSTRUCTIONS   Cough suppressants may be used if you are losing too much rest. However, coughing protects you by clearing your lungs. You should avoid using cough suppressants if you can.  Your caregiver may have prescribed medicine if he or she thinks your pneumonia is caused by a bacteria or influenza. Finish your medicine even if you start to feel better.  Your caregiver may also prescribe an expectorant. This loosens the mucus to be coughed up.  Only take over-the-counter or prescription medicines for pain, discomfort, or fever as directed by your caregiver.  Do not smoke. Smoking is a common cause of bronchitis and can contribute to pneumonia. If you are a smoker and continue to smoke, your cough may last several weeks after your pneumonia has cleared.  A cold steam vaporizer or humidifier in your room or home may help loosen mucus.  Coughing is often worse at night. Sleeping in a semi-upright position in a recliner or using a couple pillows under your head will help with this.  Get rest as you feel it is needed. Your body will usually let you know when you need to rest. [place] IF:  Your illness becomes worse. This is especially true if you are elderly or weakened from any other disease.  You cannot control your cough with suppressants and are losing sleep.  You begin coughing up blood.  You develop pain which is getting worse or is uncontrolled with medicines.  You have a fever.  Any of the symptoms which initially brought you in for treatment  are getting worse rather than better.  You develop shortness of breath or chest pain. MAKE SURE YOU:   Understand these instructions.  Will watch your condition.  Will get help right away if you are not doing well or get worse. Document Released: 07/23/2005 Document Revised: 10/15/2011 Document Reviewed: 10/12/2010 [facility] Patient Information 2014 [place], [place].

## 2013-09-20 NOTE — Progress Notes (Signed)
   Subjective:    Patient ID: Rickey Wright, male    DOB: 1961-06-25, 53 y.o.   MRN: [id number]  Sinusitis This is a new problem. The current episode started 1 to 4 weeks ago (2w). The problem has been gradually worsening since onset. There has been no fever (reports chills). The pain is mild (HA). Associated symptoms include chills, congestion, coughing (productive), headaches, a hoarse voice and sinus pressure. Pertinent negatives include no ear pain ("feels stuffy"), neck pain, shortness of breath, sneezing, sore throat or swollen glands. Treatments tried: dayquil. The treatment provided mild relief.      Review of Systems  Constitutional: Positive for chills and fatigue. Negative for fever.  HENT: Positive for congestion, hoarse voice and sinus pressure. Negative for ear pain ("feels stuffy"), sneezing and sore throat.   Respiratory: Positive for cough (productive). Negative for chest tightness, shortness of breath and wheezing.   Cardiovascular: Positive for chest pain (sternal cp when coughs).  Gastrointestinal: Negative for nausea, abdominal pain and diarrhea.  Musculoskeletal: Negative for back pain, myalgias and neck pain.  Neurological: Positive for headaches.  Hematological: Negative for adenopathy.       Objective:   Physical Exam  Vitals reviewed. Constitutional: He is oriented to person, place, and time. He appears well-developed and well-nourished. No distress.  HENT:  Head: Normocephalic and atraumatic.  Eyes: Conjunctivae are normal. Right eye exhibits no discharge. Left eye exhibits no discharge.  Neck: Normal range of motion. Neck supple. No thyromegaly present.  Cardiovascular: Normal rate, regular rhythm and normal heart sounds.   No murmur heard. Pulmonary/Chest: Effort normal. No respiratory distress. He has wheezes (persistent RLL wheeze). He has no rales.  Lymphadenopathy:    He has no cervical adenopathy.  Neurological: He is alert and oriented to person,  place, and time.  Skin: Skin is warm and dry.  Psychiatric: He has a normal mood and affect. His behavior is normal. Thought content normal.          Assessment & Plan:  1. CAP (community acquired pneumonia) Sinusitis DD: viral resp illness w/bronchitis - albuterol (PROAIR HFA) 108 (90 BASE) MCG/ACT inhaler; 2 puffs twice daily for 5 days, then q6h PRN cough.  Dispense: 1 Inhaler; Refill: 0 - doxycycline (VIBRA-TABS) 100 MG tablet; Take 1 tablet (100 mg total) by mouth 2 (two) times daily.  Dispense: 20 tablet; Refill: 0 - guaifenesin (HUMIBID E) 400 MG TABS tablet; Take 1t po q8h for 5 days then q8h PRN chest congestion  Dispense: 56 tablet; Refill: 0

## 2013-09-23 ENCOUNTER — Ambulatory Visit (INDEPENDENT_AMBULATORY_CARE_PROVIDER_SITE_OTHER): Payer: BC Managed Care – PPO | Admitting: Internal Medicine

## 2013-09-23 ENCOUNTER — Encounter: Payer: Self-pay | Admitting: Internal Medicine

## 2013-09-23 ENCOUNTER — Ambulatory Visit (INDEPENDENT_AMBULATORY_CARE_PROVIDER_SITE_OTHER)
Admission: RE | Admit: 2013-09-23 | Discharge: 2013-09-23 | Disposition: A | Discharge status: Home / Self Care | Payer: BC Managed Care – PPO | Source: Ambulatory Visit | Attending: Internal Medicine | Admitting: Internal Medicine

## 2013-09-23 VITALS — BP 122/70 | HR 102 | Temp 98.3°F | Ht 70.0 in | Wt 250.0 lb

## 2013-09-23 DIAGNOSIS — J189 Pneumonia, unspecified organism: Secondary | ICD-10-CM

## 2013-09-23 DIAGNOSIS — J988 Other specified respiratory disorders: Secondary | ICD-10-CM

## 2013-09-23 DIAGNOSIS — J181 Lobar pneumonia, unspecified organism: Secondary | ICD-10-CM

## 2013-09-23 DIAGNOSIS — J9801 Acute bronchospasm: Secondary | ICD-10-CM

## 2013-09-23 DIAGNOSIS — J22 Unspecified acute lower respiratory infection: Secondary | ICD-10-CM

## 2013-09-23 MED ORDER — ALBUTEROL SULFATE (2.5 MG/3ML) 0.083% IN NEBU: 2.5000 mg | INHALATION_SOLUTION | Freq: Four times a day (QID) | RESPIRATORY_TRACT | Status: DC | PRN

## 2013-09-23 MED ORDER — HYDROCODONE-HOMATROPINE 5-1.5 MG/5ML PO SYRP: 5.0000 mL | ORAL_SOLUTION | ORAL | Status: DC | PRN

## 2013-09-23 MED ORDER — LEVOFLOXACIN 750 MG PO TABS: 750.0000 mg | ORAL_TABLET | Freq: Every day | ORAL | Status: DC

## 2013-09-23 NOTE — Patient Instructions (Addendum)
Get chest x ray today let you know results. change antibiotic use . Monitor BG may be off with this. Albuterol up to every 6 hours  As needed consider adding 5 days prednisone . Expect improvement in the next 72 hours.  ROV next week or as needed

## 2013-09-23 NOTE — Progress Notes (Signed)
Pre visit review using our clinic review tool, if applicable. No additional management support is needed unless otherwise documented below in the visit note.   Chief Complaint  Patient presents with  . Follow-up    CAP.  Pt is requesting chest x-ray and cough syrup.  Having rib pain.    HPI: Patient comes in today for SDA for  Continued poss worsening  problem evaluation.Here with wife  Cough is no better  Cold and  Hot.   coughing up not much. ainbhaler makes you cough  Not sleeping well from cough.  Seen Saturday clinic Here with wife  Hard to stop coughing and ribs are sore . Short of breath tight chest . Cough non productive at this time.  ROS: See pertinent positives and negatives per HPI. No hemoptysis is sob most times  Dm hasnt been checking glucose since sick  Not eating as much   Past Medical History  Diagnosis Date  . Allergy   . Depression   . Diabetes mellitus, type 2   . Hyperlipidemia   . Hypertension   . Arthritis   . Anemia     hx iron deficiency   . ADD (attention deficit disorder)   . OSA (obstructive sleep apnea)     mild not on cpap  . Loose body of left knee 06/27/2012  . Localized osteoarthritis of left knee 06/27/2012    Family History  Problem Relation Age of Onset  . Colon polyps Mother   . Heart disease Father     25s   . Cancer Maternal Grandfather     colon  . Diabetes Paternal Grandmother   . Seizures Daughter   . Thyroid disease Daughter   . Aneurysm Father     AAA smoker  . ADD / ADHD Son   . Learning disabilities Son     History   Social History  . Marital Status: Married    Spouse Name: N/A    Number of Children: N/A  . Years of Education: N/A   Social History Main Topics  . Smoking status: Never Smoker   . Smokeless tobacco: None  . Alcohol Use: Yes  . Drug Use: No  . Sexual Activity: None   Other Topics Concern  . None   Social History Narrative   5 children  Married    Education officer, museum with work   Non smoker    H H of  7 pet dog    Outpatient Encounter Prescriptions as of 09/23/2013  Medication Sig  . aspirin 81 MG chewable tablet Chew 81 mg by mouth daily.    [name] atorvastatin (LIPITOR) 10 MG tablet Take 10 mg by mouth daily.    . benazepril (LOTENSIN) 10 MG tablet Take 10 mg by mouth daily.    [name] doxycycline (VIBRA-TABS) 100 MG tablet Take 1 tablet (100 mg total) by mouth 2 (two) times daily.  [name] ezetimibe (ZETIA) 10 MG tablet Take 10 mg by mouth daily.    . fish oil-omega-3 fatty acids 1000 MG capsule Take 2 g by mouth daily.    . fluticasone (FLONASE) 50 MCG/ACT nasal spray 2 spray each nostril qd  . glucosamine-chondroitin 500-400 MG tablet Take 1 tablet by mouth 3 (three) times daily.    . insulin glargine (LANTUS) 100 UNIT/ML injection Inject 40 Units into the skin every morning.   . Liraglutide (VICTOZA) 18 MG/3ML SOLN Inject into the skin.    . metFORMIN (GLUCOPHAGE) 500 MG tablet Take 500 mg by mouth  2 (two) times daily with a meal. 2 qam and 3 qhs   . pioglitazone (ACTOS) 45 MG tablet Take 45 mg by mouth daily.   . [DISCONTINUED] albuterol (PROAIR HFA) 108 (90 BASE) MCG/ACT inhaler 2 puffs twice daily for 5 days, then q6h PRN cough.  [name] albuterol (PROVENTIL) (2.5 MG/3ML) 0.083% nebulizer solution Take 3 mLs (2.5 mg total) by nebulization every 6 (six) hours as needed for wheezing or shortness of breath.  [name] HYDROcodone-homatropine (HYCODAN) 5-1.5 MG/5ML syrup Take 5 mLs by mouth every 4 (four) hours as needed for cough.  [name] levofloxacin (LEVAQUIN) 750 MG tablet Take 1 tablet (750 mg total) by mouth daily.  . [DISCONTINUED] guaifenesin (HUMIBID E) 400 MG TABS tablet Take 1t po q8h for 5 days then q8h PRN chest congestion    EXAM:  BP 122/70  Pulse 102  Temp(Src) 98.3 F (36.8 C) (Oral)  Ht 5\' 10"  (1.778 m)  Wt 250 lb (113.399 kg)  BMI 35.87 kg/m2  SpO2 97%  Body mass index is 35.87 kg/(m^2). WDWN in NAD   Coughing spasmodically and often  No retractions and color is good  mildly congested   somewhat hoarse. Non toxic . HEENT: Normocephalic ;atraumatic , Eyes;  PERRL, EOMs  Full, lids and conjunctiva clear,,Ears: no deformities, canals nl, TM landmarks normal, Nose: no deformity or discharge but congested;face minimally tender Mouth : OP clear without lesion or edema . Neck: Supple without adenopathy or masses or bruits Chest:   Dec bs bil r more than left with wheeze musical sounds  After neb vent inc bs and less cough  CV:  S1-S2 no gallops or murmurs peripheral perfusion is normal Skin :nl perfusion and no acute rashes  Neuro alert cognition intact  ASSESSMENT AND PLAN:  Discussed the following assessment and plan:  Lower respiratory infection (e.g., bronchitis, pneumonia, pneumonitis, pulmonitis) - Plan: DG Chest 2 View  Bronchospasm - Plan: DG Chest 2 View  RLL pneumonia clinical dx  - persistant findings  get x ray intessify rx  3 weeks into illness and not responding to doxy so far some bronchospasm and has dec bs right base. Clinical pna vs asthmatic bronchitis type reaction change antibiotic get cxray  Alb neb ( has machine at home)  consdier adding pred if not improving  Cough med for comfort  -Patient advised to return or notify health care team  if symptoms worsen or persist or new concerns arise.  Patient Instructions  Get chest x ray today let you know results. change antibiotic use . Monitor BG may be off with this. Albuterol up to every 6 hours  As needed consider adding 5 days prednisone . Expect improvement in the next 72 hours.  ROV next week or as needed     Standley Brooking. Panosh M.D.

## 2014-03-04 ENCOUNTER — Other Ambulatory Visit: Payer: Self-pay | Admitting: Internal Medicine

## 2014-03-06 LAB — LIPID PANEL
CHOLESTEROL: 131 mg/dL (ref 0–200)
HDL: 35 mg/dL (ref 35–70)
LDL Cholesterol: 79 mg/dL
Triglycerides: 87 mg/dL (ref 40–160)

## 2014-03-19 ENCOUNTER — Telehealth: Payer: Self-pay | Admitting: *Deleted

## 2014-03-19 DIAGNOSIS — E785 Hyperlipidemia, unspecified: Secondary | ICD-10-CM

## 2014-03-19 DIAGNOSIS — E119 Type 2 diabetes mellitus without complications: Secondary | ICD-10-CM

## 2014-03-19 NOTE — Telephone Encounter (Signed)
Left message on machine for patient to schedule an appointment to follow up  Lipid, a1c, bmet, micro albumin Diabetic bundle

## 2014-03-20 LAB — HEMOGLOBIN A1C: Hgb A1c MFr Bld: 6.7 % — AB (ref 4.0–6.0)

## 2014-04-01 ENCOUNTER — Other Ambulatory Visit: Payer: Self-pay | Admitting: Orthopedic Surgery

## 2014-04-02 NOTE — Telephone Encounter (Signed)
See dr altheimer ask andy copy of labs labs  Asked for.

## 2014-04-05 NOTE — Telephone Encounter (Signed)
Called and spoke to the pt.  He is currently seeing Dr. Elyse Hsu for his diabetes.  He had an A1C done a week ago and it was 6.7.  Dr. Elyse Hsu has also started him on 50 mcg of Synthroid.  I have added this to his medication list.  Pt's daughter, mom, sister, brother and niece also have thyroid problems.  After [facility] reviews, will have medical records call for copies of lab work and office notes.  Pt states he has signed records release at that office.  Will sign another if needed.

## 2014-04-05 NOTE — Telephone Encounter (Signed)
have a copy of the note from august 21 from this year  Put on your desk.  ? Give to rachel ?or whomever in chargefor this initiative  to data entry the numbers correctly?   No need to get  further ;lab tests   . Note to be scanned in record.

## 2014-04-06 ENCOUNTER — Encounter: Payer: Self-pay | Admitting: Family Medicine

## 2014-04-06 NOTE — Telephone Encounter (Signed)
I have entered lab results into the system and sent the OV note from Dr. Elyse Hsu to scan.

## 2014-04-15 ENCOUNTER — Telehealth: Payer: Self-pay | Admitting: Family Medicine

## 2014-04-15 NOTE — Telephone Encounter (Signed)
Received a fax from [employer].  Pt needs medical clearance for his total knee replacement on 06/22/14.  Please schedule a 30 minute appointment with the pt. Thanks!

## 2014-04-15 NOTE — Telephone Encounter (Signed)
Pt has been sch

## 2014-05-06 ENCOUNTER — Encounter: Payer: Self-pay | Admitting: Gastroenterology

## 2014-05-14 ENCOUNTER — Ambulatory Visit (INDEPENDENT_AMBULATORY_CARE_PROVIDER_SITE_OTHER): Payer: BC Managed Care – PPO | Admitting: Internal Medicine

## 2014-05-14 ENCOUNTER — Encounter: Payer: Self-pay | Admitting: Internal Medicine

## 2014-05-14 VITALS — BP 126/70 | Temp 98.8°F | Wt 246.2 lb

## 2014-05-14 DIAGNOSIS — I10 Essential (primary) hypertension: Secondary | ICD-10-CM

## 2014-05-14 DIAGNOSIS — M1711 Unilateral primary osteoarthritis, right knee: Secondary | ICD-10-CM

## 2014-05-14 DIAGNOSIS — Z23 Encounter for immunization: Secondary | ICD-10-CM

## 2014-05-14 DIAGNOSIS — G4733 Obstructive sleep apnea (adult) (pediatric): Secondary | ICD-10-CM

## 2014-05-14 DIAGNOSIS — E785 Hyperlipidemia, unspecified: Secondary | ICD-10-CM

## 2014-05-14 DIAGNOSIS — M179 Osteoarthritis of knee, unspecified: Secondary | ICD-10-CM

## 2014-05-14 DIAGNOSIS — E119 Type 2 diabetes mellitus without complications: Secondary | ICD-10-CM

## 2014-05-14 DIAGNOSIS — Z01818 Encounter for other preprocedural examination: Secondary | ICD-10-CM

## 2014-05-14 NOTE — Patient Instructions (Signed)
Will send  Preoperative clearance form to Dr.  Mardelle Matte  . No increase risk concerns for your surgery.

## 2014-05-14 NOTE — Progress Notes (Signed)
Pre visit review using our clinic review tool, if applicable. No additional management support is needed unless otherwise documented below in the visit note.  Chief Complaint  Patient presents with  . Medical Clearance    right TKR nov 17    HPI: Patient comes in today for preoperative evalution for surgery .Total Right Knee replacement Dr. Mardelle Matte planned November 17.   He is a 53 year old nonsmoking married male with long-standing history of hypertension hyperlipidemia and type 2 diabetes that is currently well controlled under care of endocrinology specialist.  Has had problems with his right knee and has had effusions with recurrence . He has a history of knee surgeries.arthro left knee earlier this year for loose body.  He has no history of CVA cardiovascular acute events or unusual bleeding.  He has a history of mild upper airway obstruction but no need for CPAP or significant sleep apnea otherwise.  Hx PNA rx in February but no chronic respiratory symptoms.  History of recurrent sinusitis not active. ROS:  GEN/ HEENT: No fever, significant weight changes sweats headaches vision problems glasses no retinopathy hearing changes, CV/ PULM; No chest pain shortness of breath cough, syncope,edema  change in exercise tolerance. Exercise is limited by orthopedic predicament. GI /GU: No adominal pain, vomiting, change in bowel habits. No blood in the stool. No significant GU symptoms. SKIN/HEME: ,no acute skin rashes suspicious lesions or bleeding. No lymphadenopathy, nodules, masses.  NEURO/ PSYCH:  No neurologic signs such as weakness numbness. No depression anxiety. History of ADD IMM/ Allergy: No unusual infections.  Allergy .   REST of 12 system review negative except as per HPI  Past Medical History  Diagnosis Date  . Allergy   . Depression   . Diabetes mellitus, type 2   . Hyperlipidemia   . Hypertension   . Arthritis   . Anemia     hx iron deficiency   . ADD (attention  deficit disorder)   . OSA (obstructive sleep apnea)     mild not on cpap  . Loose body of left knee 06/27/2012  . Localized osteoarthritis of left knee 06/27/2012    Family History  Problem Relation Age of Onset  . Colon polyps Mother   . Heart disease Father     80s   . Cancer Maternal Grandfather     colon  . Diabetes Paternal Grandmother   . Seizures Daughter   . Thyroid disease Daughter   . Aneurysm Father     AAA smoker  . ADD / ADHD Son   . Learning disabilities Son     History   Social History  . Marital Status: Married    Spouse Name: N/A    Number of Children: N/A  . Years of Education: N/A   Social History Main Topics  . Smoking status: Never Smoker   . Smokeless tobacco: None  . Alcohol Use: Yes  . Drug Use: No  . Sexual Activity: None   Other Topics Concern  . None   Social History Narrative   5 children  Married    Education officer, museum with work   Non smoker    H H of 7 pet dog    Outpatient Encounter Prescriptions as of 05/14/2014  Medication Sig  . aspirin 81 MG chewable tablet Chew 81 mg by mouth daily.    [name] atorvastatin (LIPITOR) 10 MG tablet Take 10 mg by mouth daily.    . benazepril (LOTENSIN) 10 MG tablet Take 10 mg by mouth  daily.    . ezetimibe (ZETIA) 10 MG tablet Take 10 mg by mouth daily.    . fish oil-omega-3 fatty acids 1000 MG capsule Take 2 g by mouth daily.    . insulin glargine (LANTUS) 100 UNIT/ML injection Inject 40 Units into the skin every morning.   [name] levothyroxine (SYNTHROID, LEVOTHROID) 50 MCG tablet Take 50 mcg by mouth daily before breakfast.  . Liraglutide (VICTOZA) 18 MG/3ML SOLN Inject into the skin.    . metFORMIN (GLUCOPHAGE) 500 MG tablet Take 500 mg by mouth 2 (two) times daily with a meal. 2 qam and 3 qhs   . pioglitazone (ACTOS) 45 MG tablet Take 45 mg by mouth daily.   . [DISCONTINUED] albuterol (PROVENTIL) (2.5 MG/3ML) 0.083% nebulizer solution Take 3 mLs (2.5 mg total) by nebulization every 6 (six) hours as needed for  wheezing or shortness of breath.  . [DISCONTINUED] doxycycline (VIBRA-TABS) 100 MG tablet Take 1 tablet (100 mg total) by mouth 2 (two) times daily.  . [DISCONTINUED] fluticasone (FLONASE) 50 MCG/ACT nasal spray 2 spray each nostril qd  . [DISCONTINUED] glucosamine-chondroitin 500-400 MG tablet Take 1 tablet by mouth 3 (three) times daily.    . [DISCONTINUED] HYDROcodone-homatropine (HYCODAN) 5-1.5 MG/5ML syrup Take 5 mLs by mouth every 4 (four) hours as needed for cough.  . [DISCONTINUED] levofloxacin (LEVAQUIN) 750 MG tablet Take 1 tablet (750 mg total) by mouth daily.    EXAM:  BP 126/70  Temp(Src) 98.8 F (37.1 C) (Oral)  Wt 246 lb 3.2 oz (111.676 kg)  Body mass index is 35.33 kg/(m^2).  Physical Exam: Vital signs reviewed WUJ:WJXB is a well-developed well-nourished alert cooperative    healthy appearing male  appearsr stated age in no acute distress.  HEENT: normocephalic atraumatic , Eyes: PERRL EOM's full, conjunctiva clear, Nares: paten,t no deformity discharge or tenderness., Ears: no deformity EAC's clear TMs with normal landmarks. Mouth: clear OP, no lesions, edema.  Moist mucous membranes. Dentition in adequate repair. NECK: supple without masses, thyromegaly or bruits. CHEST/PULM:  Clear to auscultation and percussion breath sounds equal no wheeze , rales or rhonchi. No chest wall deformities or tenderness. CV: PMI is nondisplaced, S1 S2 no gallops, murmurs, rubs. Peripheral pulses are full without delay.No JVD .  ABDOMEN: Bowel sounds normal nontender  No guard or rebound, no hepato splenomegal no CVA tenderness. Extremtities:  No clubbing cyanosis or edema, no acute joint swelling or redness no focal atrophy crepitus right knee NEURO:  Oriented x3, cranial nerves 3-12 appear to be intact, no obvious focal weakness,gait within normal limits  SKIN: No acute rashes normal turgor, color, no bruising or petechiae. PSYCH: Oriented, good eye contact, no obvious depression anxiety,  cognition and judgment appear normal. LN: no cervical  adenopathy Laboratory studies on scanned document /media for Dr. Elyse Hsu ASSESSMENT AND PLAN:  Discussed the following assessment and plan:  Osteoarthritis of right knee, unspecified osteoarthritis type  Pre-op exam  Diabetes type 2, controlled - without complication  Hyperlipemia  OSA (obstructive sleep apnea) - no cpap needed  Essential hypertension  Need for prophylactic vaccination and inoculation against influenza - Plan: Flu Vaccine QUAD 36+ mos PF IM (Fluarix Quad PF) Patient is acceptable candidate to proceed with right knee replacements without significant high-risk contraindications to surgery. His blood testing has been done through his endocrinologist. Burnis Medin send a formal clearance to Dr. Luanna Cole  office. Patient Care Team: Burnis Medin, MD as PCP - General Juanda Bond Altheimer, MD (Endocrinology) Sable Feil, MD (Gastroenterology)  Patient Instructions  Will send  Preoperative clearance form to Dr.  Mardelle Matte  . No increase risk concerns for your surgery.     Standley Brooking. Sven Pinheiro M.D.

## 2014-05-17 ENCOUNTER — Telehealth: Payer: Self-pay | Admitting: Internal Medicine

## 2014-05-17 NOTE — Telephone Encounter (Signed)
emmi mailed  °

## 2014-06-07 ENCOUNTER — Encounter (HOSPITAL_COMMUNITY): Payer: Self-pay

## 2014-06-10 NOTE — Pre-Procedure Instructions (Signed)
Rickey Wright  06/10/2014   Your procedure is scheduled on:  Tuesday, November 17th  Report to [facility] Admitting at 530 AM.  Call this number if you have problems the morning of surgery: [phone]   Remember:   Do not eat food or drink liquids after midnight.   Take these medicines the morning of surgery with A SIP OF WATER: synthroid  Stop taking aspirin, OTC vitamins/herbal medications, NSAIDS (ibuprofen, advil, motrin) 7 days prior to surgery.   Do not wear jewelry.  Do not wear lotions, powders, or perfume,deodorant.  Do not shave 48 hours prior to surgery. Men may shave face and neck.  Do not bring valuables to the hospital.  [facility] is not responsible  for any belongings or valuables.               Contacts, dentures or bridgework may not be worn into surgery.  Leave suitcase in the car. After surgery it may be brought to your room.  For patients admitted to the hospital, discharge time is determined by your  treatment team.             Please read over the following fact sheets that you were given: Pain Booklet, Coughing and Deep Breathing, MRSA Information and Surgical Site Infection Prevention  [place] - Preparing for Surgery  Before surgery, you can play an important role.  Because skin is not sterile, your skin needs to be as free of germs as possible.  You can reduce the number of germs on you skin by washing with CHG (chlorahexidine gluconate) soap before surgery.  CHG is an antiseptic cleaner which kills germs and bonds with the skin to continue killing germs even after washing.  Please DO NOT use if you have an allergy to CHG or antibacterial soaps.  If your skin becomes reddened/irritated stop using the CHG and inform your nurse when you arrive at Short Stay.  Do not shave (including legs and underarms) for at least 48 hours prior to the first CHG shower.  You may shave your face.  Please follow these instructions carefully:   1.  Shower  with CHG Soap the night before surgery and the morning of Surgery.  2.  If you choose to wash your hair, wash your hair first as usual with your normal shampoo.  3.  After you shampoo, rinse your hair and body thoroughly to remove the shampoo.  4.  Use CHG as you would any other liquid soap.  You can apply CHG directly to the skin and wash gently with scrungie or a clean washcloth.  5.  Apply the CHG Soap to your body ONLY FROM THE NECK DOWN.  Do not use on open wounds or open sores.  Avoid contact with your eyes, ears, mouth and genitals (private parts).  Wash genitals (private parts) with your normal soap.  6.  Wash thoroughly, paying special attention to the area where your surgery will be performed.  7.  Thoroughly rinse your body with warm water from the neck down.  8.  DO NOT shower/wash with your normal soap after using and rinsing off the CHG Soap.  9.  Pat yourself dry with a clean towel.            10.  Wear clean pajamas.            11.  Place clean sheets on your bed the night of your first shower and do not sleep with  pets.  Day of Surgery  Do not apply any lotions/deoderants the morning of surgery.  Please wear clean clothes to the hospital/surgery center.

## 2014-06-11 ENCOUNTER — Encounter (HOSPITAL_COMMUNITY)
Admission: RE | Admit: 2014-06-11 | Discharge: 2014-06-11 | Disposition: A | Discharge status: Home / Self Care | Payer: BC Managed Care – PPO | Source: Ambulatory Visit | Attending: Orthopedic Surgery | Admitting: Orthopedic Surgery

## 2014-06-11 ENCOUNTER — Encounter (HOSPITAL_COMMUNITY): Payer: Self-pay

## 2014-06-11 DIAGNOSIS — Z01810 Encounter for preprocedural cardiovascular examination: Secondary | ICD-10-CM | POA: Insufficient documentation

## 2014-06-11 DIAGNOSIS — Z01812 Encounter for preprocedural laboratory examination: Secondary | ICD-10-CM | POA: Diagnosis not present

## 2014-06-11 DIAGNOSIS — M1711 Unilateral primary osteoarthritis, right knee: Secondary | ICD-10-CM | POA: Diagnosis not present

## 2014-06-11 LAB — BASIC METABOLIC PANEL
Anion gap: 15 (ref 5–15)
BUN: 20 mg/dL (ref 6–23)
CALCIUM: 9.2 mg/dL (ref 8.4–10.5)
CO2: 23 mEq/L (ref 19–32)
CREATININE: 0.84 mg/dL (ref 0.50–1.35)
Chloride: 102 mEq/L (ref 96–112)
Glucose, Bld: 117 mg/dL — ABNORMAL HIGH (ref 70–99)
Potassium: 4.4 mEq/L (ref 3.7–5.3)
Sodium: 140 mEq/L (ref 137–147)

## 2014-06-11 LAB — SURGICAL PCR SCREEN
MRSA, PCR: NEGATIVE
Staphylococcus aureus: POSITIVE — AB

## 2014-06-11 LAB — CBC
HEMATOCRIT: 44.7 % (ref 39.0–52.0)
Hemoglobin: 14.5 g/dL (ref 13.0–17.0)
MCH: 27.8 pg (ref 26.0–34.0)
MCHC: 32.4 g/dL (ref 30.0–36.0)
MCV: 85.8 fL (ref 78.0–100.0)
PLATELETS: 281 10*3/uL (ref 150–400)
RBC: 5.21 MIL/uL (ref 4.22–5.81)
RDW: 13.8 % (ref 11.5–15.5)
WBC: 10 10*3/uL (ref 4.0–10.5)

## 2014-06-15 ENCOUNTER — Other Ambulatory Visit: Payer: Self-pay | Admitting: Family Medicine

## 2014-06-21 MED ORDER — CEFAZOLIN SODIUM-DEXTROSE 2-3 GM-% IV SOLR
2.0000 g | INTRAVENOUS | Status: AC
  Administered 2014-06-22: 2 g via INTRAVENOUS
  Filled 2014-06-21: qty 50

## 2014-06-22 ENCOUNTER — Inpatient Hospital Stay (HOSPITAL_COMMUNITY)
Admission: RE | Admit: 2014-06-22 | Discharge: 2014-06-24 | DRG: 470 | Disposition: A | Discharge status: Home Health | Payer: BC Managed Care – PPO | Source: Ambulatory Visit | Attending: Orthopedic Surgery | Admitting: Orthopedic Surgery

## 2014-06-22 ENCOUNTER — Inpatient Hospital Stay (HOSPITAL_COMMUNITY): Payer: BC Managed Care – PPO | Admitting: Anesthesiology

## 2014-06-22 ENCOUNTER — Encounter (HOSPITAL_COMMUNITY)
Admission: RE | Disposition: A | Discharge status: Home Health | Payer: Self-pay | Source: Ambulatory Visit | Attending: Orthopedic Surgery

## 2014-06-22 ENCOUNTER — Encounter (HOSPITAL_COMMUNITY): Payer: Self-pay | Admitting: Anesthesiology

## 2014-06-22 ENCOUNTER — Inpatient Hospital Stay (HOSPITAL_COMMUNITY): Payer: BC Managed Care – PPO

## 2014-06-22 DIAGNOSIS — M1711 Unilateral primary osteoarthritis, right knee: Principal | ICD-10-CM

## 2014-06-22 DIAGNOSIS — I10 Essential (primary) hypertension: Secondary | ICD-10-CM | POA: Diagnosis present

## 2014-06-22 DIAGNOSIS — Z7982 Long term (current) use of aspirin: Secondary | ICD-10-CM

## 2014-06-22 DIAGNOSIS — G4733 Obstructive sleep apnea (adult) (pediatric): Secondary | ICD-10-CM | POA: Diagnosis present

## 2014-06-22 DIAGNOSIS — M179 Osteoarthritis of knee, unspecified: Secondary | ICD-10-CM | POA: Diagnosis present

## 2014-06-22 DIAGNOSIS — Z833 Family history of diabetes mellitus: Secondary | ICD-10-CM | POA: Diagnosis not present

## 2014-06-22 DIAGNOSIS — E119 Type 2 diabetes mellitus without complications: Secondary | ICD-10-CM | POA: Diagnosis present

## 2014-06-22 DIAGNOSIS — J45998 Other asthma: Secondary | ICD-10-CM | POA: Diagnosis present

## 2014-06-22 DIAGNOSIS — Z8249 Family history of ischemic heart disease and other diseases of the circulatory system: Secondary | ICD-10-CM | POA: Diagnosis not present

## 2014-06-22 DIAGNOSIS — Z79899 Other long term (current) drug therapy: Secondary | ICD-10-CM

## 2014-06-22 DIAGNOSIS — Z794 Long term (current) use of insulin: Secondary | ICD-10-CM | POA: Diagnosis not present

## 2014-06-22 DIAGNOSIS — G8918 Other acute postprocedural pain: Secondary | ICD-10-CM | POA: Diagnosis not present

## 2014-06-22 DIAGNOSIS — E039 Hypothyroidism, unspecified: Secondary | ICD-10-CM | POA: Diagnosis present

## 2014-06-22 DIAGNOSIS — Z96651 Presence of right artificial knee joint: Secondary | ICD-10-CM

## 2014-06-22 DIAGNOSIS — F988 Other specified behavioral and emotional disorders with onset usually occurring in childhood and adolescence: Secondary | ICD-10-CM | POA: Diagnosis present

## 2014-06-22 DIAGNOSIS — E785 Hyperlipidemia, unspecified: Secondary | ICD-10-CM | POA: Diagnosis present

## 2014-06-22 HISTORY — PX: TOTAL KNEE ARTHROPLASTY: SHX125

## 2014-06-22 HISTORY — DX: Unspecified asthma, uncomplicated: J45.909

## 2014-06-22 HISTORY — DX: Hypothyroidism, unspecified: E03.9

## 2014-06-22 HISTORY — DX: Other intervertebral disc degeneration, lumbosacral region: M51.37

## 2014-06-22 HISTORY — DX: Unilateral primary osteoarthritis, right knee: M17.11

## 2014-06-22 HISTORY — DX: Other intervertebral disc degeneration, lumbosacral region without mention of lumbar back pain or lower extremity pain: M51.379

## 2014-06-22 LAB — GLUCOSE, CAPILLARY
GLUCOSE-CAPILLARY: 119 mg/dL — AB (ref 70–99)
GLUCOSE-CAPILLARY: 188 mg/dL — AB (ref 70–99)
Glucose-Capillary: 178 mg/dL — ABNORMAL HIGH (ref 70–99)
Glucose-Capillary: 318 mg/dL — ABNORMAL HIGH (ref 70–99)
Glucose-Capillary: 223 mg/dL — ABNORMAL HIGH (ref 70–99)
Glucose-Capillary: 69 mg/dL — ABNORMAL LOW (ref 70–99)

## 2014-06-22 SURGERY — ARTHROPLASTY, KNEE, TOTAL
Anesthesia: General | Site: Knee | Laterality: Right

## 2014-06-22 MED ORDER — CEFAZOLIN SODIUM-DEXTROSE 2-3 GM-% IV SOLR
2.0000 g | Freq: Four times a day (QID) | INTRAVENOUS | Status: AC
  Administered 2014-06-22 (×2): 2 g via INTRAVENOUS
  Filled 2014-06-22 (×2): qty 50

## 2014-06-22 MED ORDER — KETOROLAC TROMETHAMINE 15 MG/ML IJ SOLN
7.5000 mg | Freq: Four times a day (QID) | INTRAMUSCULAR | Status: AC
  Administered 2014-06-22 – 2014-06-23 (×4): 7.5 mg via INTRAVENOUS
  Filled 2014-06-22: qty 1

## 2014-06-22 MED ORDER — PROPOFOL 10 MG/ML IV BOLUS
INTRAVENOUS | Status: AC
  Filled 2014-06-22: qty 20

## 2014-06-22 MED ORDER — MIDAZOLAM HCL 5 MG/5ML IJ SOLN
INTRAMUSCULAR | Status: DC | PRN
  Administered 2014-06-22: 2 mg via INTRAVENOUS

## 2014-06-22 MED ORDER — METHOCARBAMOL 500 MG PO TABS
500.0000 mg | ORAL_TABLET | Freq: Four times a day (QID) | ORAL | Status: DC | PRN
  Administered 2014-06-22 – 2014-06-24 (×8): 500 mg via ORAL
  Filled 2014-06-22 (×7): qty 1

## 2014-06-22 MED ORDER — SUCCINYLCHOLINE CHLORIDE 20 MG/ML IJ SOLN
INTRAMUSCULAR | Status: AC
  Filled 2014-06-22: qty 2

## 2014-06-22 MED ORDER — INSULIN GLARGINE 100 UNIT/ML ~~LOC~~ SOLN
40.0000 [IU] | SUBCUTANEOUS | Status: DC
  Filled 2014-06-22: qty 0.4

## 2014-06-22 MED ORDER — PIOGLITAZONE HCL 45 MG PO TABS
45.0000 mg | ORAL_TABLET | Freq: Every day | ORAL | Status: DC
  Administered 2014-06-22 – 2014-06-24 (×3): 45 mg via ORAL
  Filled 2014-06-22 (×3): qty 1

## 2014-06-22 MED ORDER — POTASSIUM CHLORIDE IN NACL 20-0.45 MEQ/L-% IV SOLN
INTRAVENOUS | Status: DC
  Administered 2014-06-22 – 2014-06-23 (×2): via INTRAVENOUS
  Filled 2014-06-22 (×6): qty 1000

## 2014-06-22 MED ORDER — OXYCODONE HCL 5 MG PO TABS
5.0000 mg | ORAL_TABLET | Freq: Once | ORAL | Status: AC | PRN
  Administered 2014-06-22: 5 mg via ORAL

## 2014-06-22 MED ORDER — INSULIN GLARGINE 100 UNIT/ML ~~LOC~~ SOLN
40.0000 [IU] | SUBCUTANEOUS | Status: DC
  Administered 2014-06-22 – 2014-06-24 (×3): 40 [IU] via SUBCUTANEOUS
  Filled 2014-06-22 (×4): qty 0.4

## 2014-06-22 MED ORDER — PSEUDOEPHEDRINE HCL 60 MG PO TABS
120.0000 mg | ORAL_TABLET | Freq: Every day | ORAL | Status: DC
  Administered 2014-06-22 – 2014-06-24 (×3): 120 mg via ORAL
  Filled 2014-06-22 (×3): qty 2

## 2014-06-22 MED ORDER — ACETAMINOPHEN 650 MG RE SUPP: 650.0000 mg | Freq: Four times a day (QID) | RECTAL | Status: DC | PRN

## 2014-06-22 MED ORDER — ALUM & MAG HYDROXIDE-SIMETH 200-200-20 MG/5ML PO SUSP: 30.0000 mL | ORAL | Status: DC | PRN

## 2014-06-22 MED ORDER — ALBUTEROL SULFATE HFA 108 (90 BASE) MCG/ACT IN AERS
INHALATION_SPRAY | RESPIRATORY_TRACT | Status: DC | PRN
  Administered 2014-06-22: 4 via RESPIRATORY_TRACT

## 2014-06-22 MED ORDER — ACETAMINOPHEN 10 MG/ML IV SOLN
INTRAVENOUS | Status: DC | PRN
  Administered 2014-06-22: 1000 mg via INTRAVENOUS

## 2014-06-22 MED ORDER — HYDROMORPHONE HCL 1 MG/ML IJ SOLN
INTRAMUSCULAR | Status: DC | PRN
  Administered 2014-06-22: 1 mg via INTRAVENOUS

## 2014-06-22 MED ORDER — PHENOL 1.4 % MT LIQD: 1.0000 | OROMUCOSAL | Status: DC | PRN

## 2014-06-22 MED ORDER — LACTATED RINGERS IV SOLN
INTRAVENOUS | Status: DC | PRN
  Administered 2014-06-22 (×2): via INTRAVENOUS

## 2014-06-22 MED ORDER — MENTHOL 3 MG MT LOZG: 1.0000 | LOZENGE | OROMUCOSAL | Status: DC | PRN

## 2014-06-22 MED ORDER — INSULIN ASPART 100 UNIT/ML ~~LOC~~ SOLN
0.0000 [IU] | Freq: Three times a day (TID) | SUBCUTANEOUS | Status: DC
  Administered 2014-06-22: 11 [IU] via SUBCUTANEOUS
  Administered 2014-06-23 – 2014-06-24 (×4): 3 [IU] via SUBCUTANEOUS
  Administered 2014-06-24: 5 [IU] via SUBCUTANEOUS

## 2014-06-22 MED ORDER — ONDANSETRON HCL 4 MG/2ML IJ SOLN: 4.0000 mg | Freq: Once | INTRAMUSCULAR | Status: DC | PRN

## 2014-06-22 MED ORDER — FENTANYL CITRATE 0.05 MG/ML IJ SOLN
INTRAMUSCULAR | Status: AC
  Filled 2014-06-22: qty 5

## 2014-06-22 MED ORDER — VITAMIN D 50 MCG (2000 UT) PO CAPS: 2000.0000 [IU] | ORAL_CAPSULE | Freq: Every day | ORAL | Status: DC

## 2014-06-22 MED ORDER — MIDAZOLAM HCL 2 MG/2ML IJ SOLN
INTRAMUSCULAR | Status: AC
  Filled 2014-06-22: qty 2

## 2014-06-22 MED ORDER — POLYETHYLENE GLYCOL 3350 17 G PO PACK
17.0000 g | PACK | Freq: Every day | ORAL | Status: DC | PRN
  Administered 2014-06-23: 17 g via ORAL
  Filled 2014-06-22 (×2): qty 1

## 2014-06-22 MED ORDER — SENNA 8.6 MG PO TABS
1.0000 | ORAL_TABLET | Freq: Two times a day (BID) | ORAL | Status: DC
  Administered 2014-06-22 – 2014-06-24 (×5): 8.6 mg via ORAL
  Filled 2014-06-22 (×6): qty 1

## 2014-06-22 MED ORDER — ONDANSETRON HCL 4 MG/2ML IJ SOLN
INTRAMUSCULAR | Status: DC | PRN
  Administered 2014-06-22: 4 mg via INTRAVENOUS

## 2014-06-22 MED ORDER — OXYCODONE-ACETAMINOPHEN 10-325 MG PO TABS: 1.0000 | ORAL_TABLET | Freq: Four times a day (QID) | ORAL | Status: DC | PRN

## 2014-06-22 MED ORDER — HYDROMORPHONE HCL 1 MG/ML IJ SOLN
INTRAMUSCULAR | Status: AC
  Filled 2014-06-22: qty 1

## 2014-06-22 MED ORDER — OXYCODONE HCL 5 MG PO TABS
5.0000 mg | ORAL_TABLET | ORAL | Status: DC | PRN
  Administered 2014-06-22 (×2): 10 mg via ORAL
  Administered 2014-06-23: 5 mg via ORAL
  Administered 2014-06-23 – 2014-06-24 (×9): 10 mg via ORAL
  Filled 2014-06-22 (×12): qty 2

## 2014-06-22 MED ORDER — SUCCINYLCHOLINE CHLORIDE 20 MG/ML IJ SOLN
INTRAMUSCULAR | Status: DC | PRN
  Administered 2014-06-22: 30 mg via INTRAVENOUS

## 2014-06-22 MED ORDER — PROPOFOL 10 MG/ML IV BOLUS
INTRAVENOUS | Status: DC | PRN
  Administered 2014-06-22: 200 mg via INTRAVENOUS

## 2014-06-22 MED ORDER — METHOCARBAMOL 500 MG PO TABS
ORAL_TABLET | ORAL | Status: AC
  Filled 2014-06-22: qty 1

## 2014-06-22 MED ORDER — LIRAGLUTIDE 18 MG/3ML ~~LOC~~ SOPN
1.8000 mg | PEN_INJECTOR | Freq: Every morning | SUBCUTANEOUS | Status: DC
  Administered 2014-06-22 – 2014-06-23 (×2): 1.8 mg via SUBCUTANEOUS

## 2014-06-22 MED ORDER — EZETIMIBE 10 MG PO TABS
10.0000 mg | ORAL_TABLET | Freq: Every day | ORAL | Status: DC
  Administered 2014-06-23 – 2014-06-24 (×2): 10 mg via ORAL
  Filled 2014-06-22 (×3): qty 1

## 2014-06-22 MED ORDER — LIRAGLUTIDE 18 MG/3ML ~~LOC~~ SOLN: 1.8000 mg | Freq: Every day | SUBCUTANEOUS | Status: DC

## 2014-06-22 MED ORDER — BACLOFEN 10 MG PO TABS: 10.0000 mg | ORAL_TABLET | Freq: Three times a day (TID) | ORAL | Status: DC

## 2014-06-22 MED ORDER — ONDANSETRON HCL 4 MG/2ML IJ SOLN
INTRAMUSCULAR | Status: AC
  Filled 2014-06-22: qty 2

## 2014-06-22 MED ORDER — SODIUM CHLORIDE 0.9 % IR SOLN
Status: DC | PRN
  Administered 2014-06-22: 3000 mL

## 2014-06-22 MED ORDER — ATORVASTATIN CALCIUM 10 MG PO TABS
10.0000 mg | ORAL_TABLET | Freq: Every day | ORAL | Status: DC
  Administered 2014-06-22 – 2014-06-23 (×2): 10 mg via ORAL
  Filled 2014-06-22 (×3): qty 1

## 2014-06-22 MED ORDER — ACETAMINOPHEN 10 MG/ML IV SOLN
INTRAVENOUS | Status: AC
  Filled 2014-06-22: qty 100

## 2014-06-22 MED ORDER — METOCLOPRAMIDE HCL 5 MG PO TABS
5.0000 mg | ORAL_TABLET | Freq: Three times a day (TID) | ORAL | Status: DC | PRN
  Administered 2014-06-24: 10 mg via ORAL
  Filled 2014-06-22 (×2): qty 2

## 2014-06-22 MED ORDER — RIVAROXABAN 10 MG PO TABS
10.0000 mg | ORAL_TABLET | Freq: Every day | ORAL | Status: DC
  Administered 2014-06-23 – 2014-06-24 (×2): 10 mg via ORAL
  Filled 2014-06-22 (×3): qty 1

## 2014-06-22 MED ORDER — FENTANYL CITRATE 0.05 MG/ML IJ SOLN
INTRAMUSCULAR | Status: DC | PRN
  Administered 2014-06-22: 100 ug via INTRAVENOUS
  Administered 2014-06-22 (×3): 50 ug via INTRAVENOUS
  Administered 2014-06-22: 25 ug via INTRAVENOUS

## 2014-06-22 MED ORDER — ONDANSETRON HCL 4 MG PO TABS: 4.0000 mg | ORAL_TABLET | Freq: Four times a day (QID) | ORAL | Status: DC | PRN

## 2014-06-22 MED ORDER — OXYCODONE HCL 5 MG PO TABS
ORAL_TABLET | ORAL | Status: AC
  Filled 2014-06-22: qty 1

## 2014-06-22 MED ORDER — HYDROMORPHONE HCL 1 MG/ML IJ SOLN
1.0000 mg | INTRAMUSCULAR | Status: DC | PRN
  Administered 2014-06-22 – 2014-06-23 (×6): 1 mg via INTRAVENOUS
  Filled 2014-06-22 (×7): qty 1

## 2014-06-22 MED ORDER — RIVAROXABAN 10 MG PO TABS
10.0000 mg | ORAL_TABLET | Freq: Every day | ORAL | Status: DC
Start: 2014-06-22 — End: 2015-11-24

## 2014-06-22 MED ORDER — METHOCARBAMOL 1000 MG/10ML IJ SOLN
500.0000 mg | Freq: Four times a day (QID) | INTRAVENOUS | Status: DC | PRN
  Filled 2014-06-22: qty 5

## 2014-06-22 MED ORDER — ASPIRIN 81 MG PO CHEW
81.0000 mg | CHEWABLE_TABLET | Freq: Every day | ORAL | Status: DC
  Administered 2014-06-23 – 2014-06-24 (×2): 81 mg via ORAL
  Filled 2014-06-22 (×2): qty 1

## 2014-06-22 MED ORDER — MAGNESIUM CITRATE PO SOLN: 1.0000 | Freq: Once | ORAL | Status: AC | PRN

## 2014-06-22 MED ORDER — LIDOCAINE HCL (CARDIAC) 20 MG/ML IV SOLN
INTRAVENOUS | Status: DC | PRN
  Administered 2014-06-22: 50 mg via INTRAVENOUS

## 2014-06-22 MED ORDER — ALBUTEROL SULFATE HFA 108 (90 BASE) MCG/ACT IN AERS
INHALATION_SPRAY | RESPIRATORY_TRACT | Status: AC
  Filled 2014-06-22: qty 6.7

## 2014-06-22 MED ORDER — DIPHENHYDRAMINE HCL 12.5 MG/5ML PO ELIX: 12.5000 mg | ORAL_SOLUTION | ORAL | Status: DC | PRN

## 2014-06-22 MED ORDER — HYDROMORPHONE HCL 1 MG/ML IJ SOLN
0.2500 mg | INTRAMUSCULAR | Status: DC | PRN
  Administered 2014-06-22 (×3): 0.5 mg via INTRAVENOUS

## 2014-06-22 MED ORDER — BISACODYL 10 MG RE SUPP: 10.0000 mg | Freq: Every day | RECTAL | Status: DC | PRN

## 2014-06-22 MED ORDER — OXYCODONE HCL 5 MG/5ML PO SOLN: 5.0000 mg | Freq: Once | ORAL | Status: AC | PRN

## 2014-06-22 MED ORDER — LIDOCAINE HCL (CARDIAC) 20 MG/ML IV SOLN
INTRAVENOUS | Status: AC
  Filled 2014-06-22: qty 10

## 2014-06-22 MED ORDER — ONDANSETRON HCL 4 MG PO TABS: 4.0000 mg | ORAL_TABLET | Freq: Three times a day (TID) | ORAL | Status: DC | PRN

## 2014-06-22 MED ORDER — ONDANSETRON HCL 4 MG/2ML IJ SOLN: 4.0000 mg | Freq: Four times a day (QID) | INTRAMUSCULAR | Status: DC | PRN

## 2014-06-22 MED ORDER — LEVOTHYROXINE SODIUM 50 MCG PO TABS
50.0000 ug | ORAL_TABLET | Freq: Every day | ORAL | Status: DC
  Administered 2014-06-23 – 2014-06-24 (×2): 50 ug via ORAL
  Filled 2014-06-22 (×3): qty 1

## 2014-06-22 MED ORDER — SENNA-DOCUSATE SODIUM 8.6-50 MG PO TABS: 2.0000 | ORAL_TABLET | Freq: Every day | ORAL | Status: DC

## 2014-06-22 MED ORDER — METOCLOPRAMIDE HCL 5 MG/ML IJ SOLN
5.0000 mg | Freq: Three times a day (TID) | INTRAMUSCULAR | Status: DC | PRN
  Filled 2014-06-22: qty 2

## 2014-06-22 MED ORDER — LORATADINE-PSEUDOEPHEDRINE ER 5-120 MG PO TB12: 1.0000 | ORAL_TABLET | Freq: Every day | ORAL | Status: DC

## 2014-06-22 MED ORDER — BENAZEPRIL HCL 10 MG PO TABS
10.0000 mg | ORAL_TABLET | Freq: Every day | ORAL | Status: DC
  Administered 2014-06-23 – 2014-06-24 (×2): 10 mg via ORAL
  Filled 2014-06-22 (×3): qty 1

## 2014-06-22 MED ORDER — ACETAMINOPHEN 325 MG PO TABS
650.0000 mg | ORAL_TABLET | Freq: Four times a day (QID) | ORAL | Status: DC | PRN
  Administered 2014-06-23 – 2014-06-24 (×2): 650 mg via ORAL
  Filled 2014-06-22 (×2): qty 2

## 2014-06-22 MED ORDER — LORATADINE 10 MG PO TABS
5.0000 mg | ORAL_TABLET | Freq: Every day | ORAL | Status: DC
  Administered 2014-06-22 – 2014-06-24 (×3): 5 mg via ORAL
  Filled 2014-06-22 (×3): qty 0.5

## 2014-06-22 MED ORDER — VITAMIN D3 25 MCG (1000 UNIT) PO TABS
2000.0000 [IU] | ORAL_TABLET | Freq: Every day | ORAL | Status: DC
  Administered 2014-06-23 – 2014-06-24 (×2): 2000 [IU] via ORAL
  Filled 2014-06-22 (×3): qty 2

## 2014-06-22 MED ORDER — DOCUSATE SODIUM 100 MG PO CAPS
100.0000 mg | ORAL_CAPSULE | Freq: Two times a day (BID) | ORAL | Status: DC
  Administered 2014-06-22 – 2014-06-24 (×5): 100 mg via ORAL
  Filled 2014-06-22 (×6): qty 1

## 2014-06-22 SURGICAL SUPPLY — 64 items
APL SKNCLS STERI-STRIP NONHPOA (GAUZE/BANDAGES/DRESSINGS) ×1
BANDAGE ELASTIC 6 VELCRO ST LF (GAUZE/BANDAGES/DRESSINGS) ×4 IMPLANT
BANDAGE ESMARK 6X9 LF (GAUZE/BANDAGES/DRESSINGS) ×1 IMPLANT
BENZOIN TINCTURE PRP APPL 2/3 (GAUZE/BANDAGES/DRESSINGS) ×3 IMPLANT
BLADE SAG 18X100X1.27 (BLADE) ×3 IMPLANT
BLADE SAW RECIP 87.9 MT (BLADE) ×3 IMPLANT
BLADE SAW SGTL 13X75X1.27 (BLADE) ×3 IMPLANT
BNDG CMPR 9X6 STRL LF SNTH (GAUZE/BANDAGES/DRESSINGS) ×1
BNDG ESMARK 6X9 LF (GAUZE/BANDAGES/DRESSINGS) ×3
BOOTCOVER CLEANROOM LRG (PROTECTIVE WEAR) ×6 IMPLANT
BOWL SMART MIX CTS (DISPOSABLE) ×3 IMPLANT
CAPT RP KNEE ×2 IMPLANT
CEMENT HV SMART SET (Cement) ×6 IMPLANT
CLOSURE STERI-STRIP 1/2X4 (GAUZE/BANDAGES/DRESSINGS) ×1
CLSR STERI-STRIP ANTIMIC 1/2X4 (GAUZE/BANDAGES/DRESSINGS) ×2 IMPLANT
COVER SURGICAL LIGHT HANDLE (MISCELLANEOUS) ×3 IMPLANT
CUFF TOURNIQUET SINGLE 34IN LL (TOURNIQUET CUFF) ×2 IMPLANT
DRAPE EXTREMITY T 121X128X90 (DRAPE) ×3 IMPLANT
DRAPE IMP U-DRAPE 54X76 (DRAPES) ×3 IMPLANT
DRAPE PROXIMA HALF (DRAPES) ×6 IMPLANT
DRAPE U-SHAPE 47X51 STRL (DRAPES) ×3 IMPLANT
DRSG PAD ABDOMINAL 8X10 ST (GAUZE/BANDAGES/DRESSINGS) ×2 IMPLANT
DURAPREP 26ML APPLICATOR (WOUND CARE) ×3 IMPLANT
ELECT CAUTERY BLADE 6.4 (BLADE) ×3 IMPLANT
ELECT REM PT RETURN 9FT ADLT (ELECTROSURGICAL) ×3
ELECTRODE REM PT RTRN 9FT ADLT (ELECTROSURGICAL) ×1 IMPLANT
FACESHIELD WRAPAROUND (MASK) ×3 IMPLANT
FACESHIELD WRAPAROUND OR TEAM (MASK) ×1 IMPLANT
GAUZE SPONGE 4X4 12PLY STRL (GAUZE/BANDAGES/DRESSINGS) ×3 IMPLANT
GLOVE BIOGEL PI ORTHO PRO SZ8 (GLOVE) ×4
GLOVE ORTHO TXT STRL SZ7.5 (GLOVE) ×3 IMPLANT
GLOVE PI ORTHO PRO STRL SZ8 (GLOVE) ×2 IMPLANT
GLOVE SURG ORTHO 8.0 STRL STRW (GLOVE) ×3 IMPLANT
GOWN STRL REUS W/ TWL XL LVL3 (GOWN DISPOSABLE) ×1 IMPLANT
GOWN STRL REUS W/TWL 2XL LVL3 (GOWN DISPOSABLE) ×3 IMPLANT
GOWN STRL REUS W/TWL XL LVL3 (GOWN DISPOSABLE) ×3
HANDPIECE INTERPULSE COAX TIP (DISPOSABLE) ×3
HOOD PEEL AWAY FACE SHEILD DIS (HOOD) ×6 IMPLANT
IMMOBILIZER KNEE 22 (SOFTGOODS) ×3 IMPLANT
KIT BASIN OR (CUSTOM PROCEDURE TRAY) ×3 IMPLANT
KIT ROOM TURNOVER OR (KITS) ×3 IMPLANT
MANIFOLD NEPTUNE II (INSTRUMENTS) ×3 IMPLANT
NS IRRIG 1000ML POUR BTL (IV SOLUTION) ×3 IMPLANT
PACK TOTAL JOINT (CUSTOM PROCEDURE TRAY) ×3 IMPLANT
PACK UNIVERSAL I (CUSTOM PROCEDURE TRAY) ×3 IMPLANT
PAD ABD 8X10 STRL (GAUZE/BANDAGES/DRESSINGS) ×3 IMPLANT
PAD ARMBOARD 7.5X6 YLW CONV (MISCELLANEOUS) ×6 IMPLANT
PAD CAST 4YDX4 CTTN HI CHSV (CAST SUPPLIES) ×1 IMPLANT
PADDING CAST COTTON 4X4 STRL (CAST SUPPLIES) ×3
PADDING CAST COTTON 6X4 STRL (CAST SUPPLIES) ×3 IMPLANT
SET HNDPC FAN SPRY TIP SCT (DISPOSABLE) ×1 IMPLANT
STAPLER VISISTAT 35W (STAPLE) ×3 IMPLANT
SUCTION FRAZIER TIP 10 FR DISP (SUCTIONS) ×3 IMPLANT
SUT MNCRL AB 4-0 PS2 18 (SUTURE) IMPLANT
SUT VIC AB 0 CT1 27 (SUTURE) ×3
SUT VIC AB 0 CT1 27XBRD ANBCTR (SUTURE) ×1 IMPLANT
SUT VIC AB 2-0 CT1 27 (SUTURE) ×3
SUT VIC AB 2-0 CT1 TAPERPNT 27 (SUTURE) ×1 IMPLANT
SUT VIC AB 3-0 SH 8-18 (SUTURE) ×6 IMPLANT
SYR 30ML LL (SYRINGE) ×3 IMPLANT
TOWEL OR 17X24 6PK STRL BLUE (TOWEL DISPOSABLE) ×3 IMPLANT
TOWEL OR 17X26 10 PK STRL BLUE (TOWEL DISPOSABLE) ×3 IMPLANT
TRAY FOLEY CATH 16FRSI W/METER (SET/KITS/TRAYS/PACK) IMPLANT
WATER STERILE IRR 1000ML POUR (IV SOLUTION) ×6 IMPLANT

## 2014-06-22 NOTE — Anesthesia Preprocedure Evaluation (Addendum)
Anesthesia Evaluation  Patient identified by MRN, date of birth, ID band Patient awake    Reviewed: Allergy & Precautions, H&P , NPO status , Patient's Chart, lab work & pertinent test results  Airway Mallampati: II  TM Distance: >3 FB Neck ROM: full    Dental  (+) Teeth Intact, Dental Advidsory Given   Pulmonary sleep apnea ,          Cardiovascular hypertension,     Neuro/Psych  Headaches, PSYCHIATRIC DISORDERS    GI/Hepatic   Endo/Other  diabetes  Renal/GU      Musculoskeletal   Abdominal   Peds  Hematology  (+) anemia ,   Anesthesia Other Findings   Reproductive/Obstetrics                           Anesthesia Physical Anesthesia Plan  ASA: II  Anesthesia Plan: General LMA   Post-op Pain Management:    Induction:   Airway Management Planned:   Additional Equipment:   Intra-op Plan:   Post-operative Plan:   Informed Consent:   Dental Advisory Given  Plan Discussed with: CRNA, Surgeon and Anesthesiologist  Anesthesia Plan Comments:         Anesthesia Quick Evaluation

## 2014-06-22 NOTE — Op Note (Signed)
DATE OF SURGERY:  06/22/2014 TIME: 9:23 AM  PATIENT NAME:  Rickey Wright   AGE: 53 y.o.    PRE-OPERATIVE DIAGNOSIS:  right knee primary localized osteoarthritis  POST-OPERATIVE DIAGNOSIS:  Same  PROCEDURE:  Procedure(s): RIGHT TOTAL KNEE ARTHROPLASTY   SURGEON:  Johnny Bridge, MD   ASSISTANT:  Joya Gaskins, OPA-C, present and scrubbed throughout the case, critical for assistance with exposure, retraction, instrumentation, and closure.   OPERATIVE IMPLANTS: Depuy PFC Sigma, Posterior Stabilized.  Femur size 5, Tibia size 4, Patella size 41  3-peg oval button, with a 10 mm polyethylene insert.   PREOPERATIVE INDICATIONS:  Rickey Wright is a 53 y.o. year old male with end stage bone on bone degenerative arthritis of the knee who failed conservative treatment, including injections, antiinflammatories, activity modification, and assistive devices, and had significant impairment of their activities of daily living, and elected for Total Knee Arthroplasty.   The risks, benefits, and alternatives were discussed at length including but not limited to the risks of infection, bleeding, nerve injury, stiffness, blood clots, the need for revision surgery, cardiopulmonary complications, among others, and they were willing to proceed.   OPERATIVE DESCRIPTION:  The patient was brought to the operative room and placed in a supine position.  General anesthesia was administered.  IV antibiotics were given.  The lower extremity was prepped and draped in the usual sterile fashion.  Time out was performed.  The leg was elevated and exsanguinated and the tourniquet was inflated.  Anterior quadriceps tendon splitting approach was performed.  The patella was everted and osteophytes were removed.  The anterior horn of the medial and lateral meniscus was removed.   The distal femur was opened with the drill and the intramedullary distal femoral cutting jig was utilized, set at 5 degrees resecting 10 mm  off the distal femur.  Care was taken to protect the collateral ligaments.  Then the extramedullary tibial cutting jig was utilized set at a neutral cut given his valgus alignment making the appropriate cut using the anterior tibial crest as a reference building in appropriate posterior slope.  Care was taken during the cut to protect the medial and collateral ligaments.  The proximal tibia was removed along with the posterior horns of the menisci.  The PCL was sacrificed.    The extensor gap was measured and was approximately 33mm.    The distal femoral sizing jig was applied, taking care to avoid notching.  Then the 4-in-1 cutting jig was applied and the anterior and posterior femur was cut, along with the chamfer cuts.  All posterior osteophytes were removed.  The flexion gap was then measured and was symmetric with the extension gap.  I completed the distal femoral preparation using the appropriate jig to prepare the box.  The patella was then measured, and cut with the saw.  It measured 28 before the cut and 17 after.    The proximal tibia sized and prepared accordingly with the reamer and the punch, and then all components were trialed with the 15mm poly insert.  The knee was found to have excellent balance and full motion.    The above named components were then cemented into place and all excess cement was removed.  The real polyethylene implant was placed.  The knee was easily taken through a range of motion and the patella tracked well and the knee irrigated copiously and the parapatellar and subcutaneous tissue closed with vicryl, and monocryl with steri strips for the skin.  The wounds were dressed with sterile gauze and the tourniquet released prior to closure and the patient was awakened and returned to the PACU in stable and satisfactory condition.  There were no complications.  Total tourniquet time was ~85 minutes.

## 2014-06-22 NOTE — Plan of Care (Signed)
Problem: Consults Goal: Diagnosis- Total Joint Replacement Primary Total Knee Right     

## 2014-06-22 NOTE — Anesthesia Postprocedure Evaluation (Signed)
  Anesthesia Post-op Note  Patient: Rickey Wright  Procedure(s) Performed: Procedure(s): RIGHT TOTAL KNEE ARTHROPLASTY (Right)  Patient Location: PACU  Anesthesia Type:General and GA combined with regional for post-op pain  Level of Consciousness: awake, alert  and oriented  Airway and Oxygen Therapy: Patient Spontanous Breathing and Patient connected to nasal cannula oxygen  Post-op Pain: mild  Post-op Assessment: Post-op Vital signs reviewed, Patient's Cardiovascular Status Stable, Respiratory Function Stable, Patent Airway and No signs of Nausea or vomiting  Post-op Vital Signs: stable  Last Vitals:  Filed Vitals:   06/22/14 1409  BP: 142/70  Pulse: 80  Temp: 36.8 C  Resp: 18    Complications: No apparent anesthesia complications

## 2014-06-22 NOTE — Progress Notes (Signed)
Report to [place] for lunch relief.

## 2014-06-22 NOTE — Anesthesia Procedure Notes (Addendum)
Procedure Name: LMA Insertion Date/Time: 06/22/2014 7:35 AM Performed by: Neldon Newport Pre-anesthesia Checklist: Patient identified, Emergency Drugs available, Suction available, Patient being monitored and Timeout performed Patient Re-evaluated:Patient Re-evaluated prior to inductionOxygen Delivery Method: Circle system utilized Preoxygenation: Pre-oxygenation with 100% oxygen Intubation Type: IV induction Ventilation: Mask ventilation without difficulty LMA: LMA inserted LMA Size: 4.0 Number of attempts: 1 Placement Confirmation: positive ETCO2 and breath sounds checked- equal and bilateral Tube secured with: Tape Dental Injury: Teeth and Oropharynx as per pre-operative assessment    Anesthesia Regional Block:  Adductor canal block  Pre-Anesthetic Checklist: ,, timeout performed, Correct Patient, Correct Site, Correct Laterality, Correct Procedure, Correct Position, site marked, Risks and benefits discussed,  Surgical consent,  Pre-op evaluation,  At surgeon's request and post-op pain management  Laterality: Right  Prep: chloraprep       Needles:  Injection technique: Single-shot  Needle Type: Echogenic Stimulator Needle     Needle Length: 9cm 9 cm Needle Gauge: 22 and 22 G    Additional Needles:  Procedures: ultrasound guided (picture in chart) Adductor canal block Narrative:  Start time: 06/22/2014 7:15 AM End time: 06/22/2014 7:20 AM Injection made incrementally with aspirations every 5 mL.  Performed by: Personally   Additional Notes: 30 cc 0.55 Marcaine with 1:200 Epi injected easily  Roberts Gaudy

## 2014-06-22 NOTE — Evaluation (Signed)
Physical Therapy Evaluation Patient Details Name: Rickey Wright MRN: [id number] DOB: [birth year] Today's Date: 06/22/2014   History of Present Illness  53 y.o. male admitted to [facility] on 06/22/14 s/p elective R TKA.  Pt with significant PMHx of multiple R knee surgeries, depression, HTN, ADD, DM 2, anemia, OA left knee, lumbosacral DDD, and L hand tendon surgery.  Clinical Impression  Pt is POD #0 and is moving well.  He is already at supervision level with RW with hallway gait.  I anticipate that he will progress well enough to go home with HHPT and his wife's assist at discharge.  He may even progress well enough to do gait training with a cane depending on how long he stays.   PT to follow acutely for deficits listed below.     Follow Up Recommendations Home health PT    Equipment Recommendations  None recommended by PT    Recommendations for Other Services   NA    Precautions / Restrictions Precautions Precautions: Knee Precaution Comments: handout given, WBAT status, KI use, and no pillow under operative knee reviewed.  Required Braces or Orthoses: Knee Immobilizer - Right Knee Immobilizer - Right: On when out of bed or walking (until discontinued) Restrictions Weight Bearing Restrictions: Yes RLE Weight Bearing: Weight bearing as tolerated      Mobility         Transfers Overall transfer level: Needs assistance Equipment used: Rolling walker (2 wheeled) Transfers: Sit to/from Stand Sit to Stand: Supervision         General transfer comment: supervision for safety.  Reliance on hands for slow, safe transitions.   Ambulation/Gait Ambulation/Gait assistance: Supervision Ambulation Distance (Feet): 100 Feet Assistive device: Rolling walker (2 wheeled) Gait Pattern/deviations: Step-through pattern;Antalgic;Trunk flexed Gait velocity: decreased Gait velocity interpretation: Below normal speed for age/gender General Gait Details: Verbal cues for upright posture,  smooth rolling of walker.  Supervision for safety during gait as is day of surgery.              Balance Overall balance assessment: Needs assistance Sitting-balance support: Feet supported;No upper extremity supported Sitting balance-Leahy Scale: Good     Standing balance support: No upper extremity supported;Bilateral upper extremity supported;Single extremity supported Standing balance-Leahy Scale: Fair Standing balance comment: static standing with supervision                             Pertinent Vitals/Pain Pain Assessment: 0-10 Pain Score: 4  Pain Location: right knee Pain Descriptors / Indicators: Aching;Burning;Sore Pain Intervention(s): Limited activity within patient's tolerance;Monitored during session;Repositioned    Home Living Family/patient expects to be discharged to:: Private residence Living Arrangements: Spouse/significant other;Children Available Help at Discharge: Family;Available 24 hours/day (wife is a stay at home mom. ) Type of Home: House Home Access: Stairs to enter Entrance Stairs-Rails: None Entrance Stairs-Number of Steps: 2 Home Layout: Two level;1/2 bath on main level;Able to live on main level with bedroom/bathroom Home Equipment: Gilford Rile - 2 wheels;Crutches;Other (comment);Shower seat - built in (has handicap height commode, walk in shower)      Prior Function Level of Independence: Independent         Comments: works as a Personnel officer, computer/desk job, works at home, Dispensing optician   Dominant Hand: Right    Extremity/Trunk Assessment   Upper Extremity Assessment: Defer to OT evaluation           Lower Extremity Assessment: RLE  deficits/detail RLE Deficits / Details: right leg with normal post op pain and weakness.  Ankle 4/5, knee 2+/5, hip 3-/5    Cervical / Trunk Assessment: Other exceptions  Communication   Communication: No difficulties  Cognition Arousal/Alertness:  Awake/alert Behavior During Therapy: WFL for tasks assessed/performed Overall Cognitive Status: Within Functional Limits for tasks assessed                         Exercises Total Joint Exercises Ankle Circles/Pumps: AROM;Both;20 reps;Supine      Assessment/Plan    PT Assessment Patient needs continued PT services  PT Diagnosis Difficulty walking;Abnormality of gait;Generalized weakness;Acute pain   PT Problem List Decreased strength;Decreased range of motion;Decreased activity tolerance;Decreased mobility;Decreased balance;Decreased knowledge of use of DME;Decreased knowledge of precautions;Pain  PT Treatment Interventions DME instruction;Gait training;Stair training;Therapeutic activities;Functional mobility training;Therapeutic exercise;Balance training;Neuromuscular re-education;Patient/family education;Manual techniques;Modalities   PT Goals (Current goals can be found in the Care Plan section) Acute Rehab PT Goals Patient Stated Goal: do what he has to do to get his knee better.  PT Goal Formulation: With patient Time For Goal Achievement: 06/29/14 Potential to Achieve Goals: Good    Frequency 7X/week    End of Session Equipment Utilized During Treatment: Right knee immobilizer Activity Tolerance: Patient tolerated treatment well Patient left: in chair;with call bell/phone within reach;with nursing/sitter in room Nurse Communication: Mobility status         Time: 1646-1710 PT Time Calculation (min) (ACUTE ONLY): 24 min   Charges:   PT Evaluation $Initial PT Evaluation Tier I: 1 Procedure PT Treatments $Gait Training: 8-22 mins        Amelita Risinger B. [place], [place], DPT [phone]   06/22/2014, 5:20 PM

## 2014-06-22 NOTE — H&P (Signed)
PREOPERATIVE H&P  Chief Complaint: djd right knee  HPI: Rickey Wright is a 53 y.o. male who presents for preoperative history and physical with a diagnosis of djd right knee. Symptoms are rated as moderate to severe, and have been worsening.  This is significantly impairing activities of daily living.  He has elected for surgical management. He has failed injections, activity modification, anti-inflammatories, knee arthroscopy, and assistive devices.   Past Medical History  Diagnosis Date  . Allergy   . Depression   . Diabetes mellitus, type 2   . Hyperlipidemia   . Hypertension   . Arthritis   . Anemia     hx iron deficiency   . ADD (attention deficit disorder)   . OSA (obstructive sleep apnea)     mild not on cpap  . Loose body of left knee 06/27/2012  . Localized osteoarthritis of left knee 06/27/2012   Past Surgical History  Procedure Laterality Date  . Rhinoplasty    . Knee arthroscopy      x3  . Knee arthroscopy      rt  . Hand tendon surgery      left  . Knee arthroscopy  06/27/2012    Procedure: ARTHROSCOPY KNEE;  Surgeon: Johnny Bridge, MD;  Location: [place];  Service: Orthopedics;  Laterality: Left;  left knee arthroscopy with removal loose foreign body , with debridement/shaving( chondroplasty)   History   Social History  . Marital Status: Married    Spouse Name: N/A    Number of Children: N/A  . Years of Education: N/A   Social History Main Topics  . Smoking status: Never Smoker   . Smokeless tobacco: Not on file  . Alcohol Use: Yes     Comment: very rarely, maybe 2 a yr..  . Drug Use: No  . Sexual Activity: Not on file   Other Topics Concern  . Not on file   Social History Narrative   5 children  Married    Education officer, museum with work   Non smoker    H H of 7 pet dog   Family History  Problem Relation Age of Onset  . Colon polyps Mother   . Heart disease Father     15s   . Cancer Maternal Grandfather     colon  . Diabetes  Paternal Grandmother   . Seizures Daughter   . Thyroid disease Daughter   . Aneurysm Father     AAA smoker  . ADD / ADHD Son   . Learning disabilities Son    Allergies  Allergen Reactions  . Horse-Derived Products     REACTION: UNKNOWN   Prior to Admission medications   Medication Sig Start Date End Date Taking? Authorizing Provider  aspirin 81 MG chewable tablet Chew 81 mg by mouth daily.     Yes Historical Provider, MD  atorvastatin (LIPITOR) 10 MG tablet Take 10 mg by mouth daily.     Yes Historical Provider, MD  benazepril (LOTENSIN) 10 MG tablet Take 10 mg by mouth daily.     Yes Historical Provider, MD  Cholecalciferol (VITAMIN D) 2000 UNITS CAPS Take 2,000 Units by mouth daily.   Yes Historical Provider, MD  ezetimibe (ZETIA) 10 MG tablet Take 10 mg by mouth daily.     Yes Historical Provider, MD  fish oil-omega-3 fatty acids 1000 MG capsule Take 2 g by mouth daily.     Yes Historical Provider, MD  ibuprofen (ADVIL,MOTRIN) 200 MG tablet Take  200 mg by mouth every 6 (six) hours as needed for moderate pain.   Yes Historical Provider, MD  insulin glargine (LANTUS) 100 UNIT/ML injection Inject 40 Units into the skin every morning.    Yes Juanda Bond Altheimer, MD  levothyroxine (SYNTHROID, LEVOTHROID) 50 MCG tablet Take 50 mcg by mouth daily before breakfast.   Yes Juanda Bond Altheimer, MD  Liraglutide (VICTOZA) 18 MG/3ML SOLN Inject 1.8 mg into the skin daily.    Yes Juanda Bond Altheimer, MD  loratadine-pseudoephedrine (CLARITIN-D 12-HOUR) 5-120 MG per tablet Take 1 tablet by mouth daily.   Yes Historical Provider, MD  metFORMIN (GLUCOPHAGE) 500 MG tablet 2 qam and 3 qhs    Yes Juanda Bond Altheimer, MD  Multiple Vitamins-Minerals (MULTIVITAMIN WITH MINERALS) tablet Take 1 tablet by mouth daily.   Yes Historical Provider, MD  pioglitazone (ACTOS) 45 MG tablet Take 45 mg by mouth daily.    Yes Juanda Bond Altheimer, MD     Positive ROS: All other systems have been reviewed and were  otherwise negative with the exception of those mentioned in the HPI and as above.  Physical Exam: General: Alert, no acute distress Cardiovascular: No pedal edema Respiratory: No cyanosis, no use of accessory musculature GI: No organomegaly, abdomen is soft and non-tender Skin: No lesions in the area of chief complaint Neurologic: Sensation intact distally Psychiatric: Patient is competent for consent with normal mood and affect Lymphatic: No axillary or cervical lymphadenopathy  MUSCULOSKELETAL: R knee ROM 0-125, mild laxity to varus and valgus stress. Positive effusion with crepitance.  X-rays demonstrate end-stage lateral compartment osteoarthritis with periarticular sclerosis, osteophyte formation and complete loss of joint space.  Assessment: Right knee primary valgus osteoarthritis  Plan: Plan for Procedure(s): RIGHT TOTAL KNEE ARTHROPLASTY  The risks benefits and alternatives were discussed with the patient including but not limited to the risks of nonoperative treatment, versus surgical intervention including infection, bleeding, nerve injury,  blood clots, cardiopulmonary complications, morbidity, mortality, among others, and they were willing to proceed.   Johnny Bridge, MD Cell [phone]   06/22/2014 6:21 AM

## 2014-06-22 NOTE — Transfer of Care (Signed)
Immediate Anesthesia Transfer of Care Note  Patient: Rickey Wright  Procedure(s) Performed: Procedure(s): RIGHT TOTAL KNEE ARTHROPLASTY (Right)  Patient Location: PACU  Anesthesia Type:General  Level of Consciousness: awake, alert  and oriented  Airway & Oxygen Therapy: Patient Spontanous Breathing and Patient connected to nasal cannula oxygen  Post-op Assessment: Report given to PACU RN, Post -op Vital signs reviewed and stable and Patient moving all extremities X 4  Post vital signs: Reviewed and stable  Complications: No apparent anesthesia complications

## 2014-06-23 ENCOUNTER — Encounter (HOSPITAL_COMMUNITY): Payer: Self-pay | Admitting: Orthopedic Surgery

## 2014-06-23 LAB — BASIC METABOLIC PANEL
Anion gap: 11 (ref 5–15)
BUN: 14 mg/dL (ref 6–23)
CALCIUM: 8.5 mg/dL (ref 8.4–10.5)
CO2: 26 mEq/L (ref 19–32)
Chloride: 97 mEq/L (ref 96–112)
Creatinine, Ser: 0.86 mg/dL (ref 0.50–1.35)
GLUCOSE: 149 mg/dL — AB (ref 70–99)
POTASSIUM: 4.5 meq/L (ref 3.7–5.3)
Sodium: 134 mEq/L — ABNORMAL LOW (ref 137–147)

## 2014-06-23 LAB — GLUCOSE, CAPILLARY
GLUCOSE-CAPILLARY: 155 mg/dL — AB (ref 70–99)
GLUCOSE-CAPILLARY: 150 mg/dL — AB (ref 70–99)
Glucose-Capillary: 132 mg/dL — ABNORMAL HIGH (ref 70–99)
Glucose-Capillary: 200 mg/dL — ABNORMAL HIGH (ref 70–99)
Glucose-Capillary: 158 mg/dL — ABNORMAL HIGH (ref 70–99)

## 2014-06-23 LAB — CBC
HCT: 36.6 % — ABNORMAL LOW (ref 39.0–52.0)
HEMOGLOBIN: 12.1 g/dL — AB (ref 13.0–17.0)
MCH: 28.1 pg (ref 26.0–34.0)
MCHC: 33.1 g/dL (ref 30.0–36.0)
MCV: 85.1 fL (ref 78.0–100.0)
Platelets: 240 10*3/uL (ref 150–400)
RBC: 4.3 MIL/uL (ref 4.22–5.81)
RDW: 13.7 % (ref 11.5–15.5)
WBC: 11.8 10*3/uL — ABNORMAL HIGH (ref 4.0–10.5)

## 2014-06-23 MED ORDER — KETOROLAC TROMETHAMINE 15 MG/ML IJ SOLN
INTRAMUSCULAR | Status: AC
  Filled 2014-06-23: qty 1

## 2014-06-23 NOTE — Progress Notes (Signed)
Hypoglycemic Event  CBG: 69  Treatment: 15 GM carbohydrate snack  Symptoms: None  Follow-up CBG: Time:1:50am CBG Result:132  Possible Reasons for Event: Unknown  Comments/MD notified:no    Sally Reimers M  Remember to initiate Hypoglycemia Order Set & complete

## 2014-06-23 NOTE — Progress Notes (Signed)
Physical Therapy Treatment Patient Details Name: Rickey Wright MRN: [id number] DOB: 1961-06-16 Today's Date: 06/23/2014    History of Present Illness 53 y.o. male admitted to [facility] on 06/22/14 s/p elective R TKA.  Pt with significant PMHx of multiple R knee surgeries, depression, HTN, ADD, DM 2, anemia, OA left knee, lumbosacral DDD, and L hand tendon surgery.    PT Comments    The session focused on safe ambulation and stair training. Pt ambulated safely with 2 wheeled RW and responded well to verbal cues. Pt demonstrated accurate recall and safety with stair negotiation which is needed to access the home. Anticipate good progress and possible transition to gait and stair training with a single point cane in afternoon session.     Follow Up Recommendations  Home health PT     Equipment Recommendations  None recommended by PT    Recommendations for Other Services       Precautions / Restrictions Precautions Precautions: Knee Precaution Comments: Pt aware of WBAT  Required Braces or Orthoses: Knee Immobilizer - Right (Pt demonstrated adequate knee stabilty w/o KI for amb) Knee Immobilizer - Right: Other (comment) (demonstrated R knee stabilty in R SLS w/o Knee immobilizer) Restrictions Weight Bearing Restrictions: Yes RLE Weight Bearing: Weight bearing as tolerated    Mobility  Bed Mobility                  Transfers Overall transfer level: Modified independent Equipment used: Rolling walker (2 wheeled) Transfers: Sit to/from Stand Sit to Stand: Min guard;Modified independent (Device/Increase time)         General transfer comment: supervision for safey, cues to ensure pt is aware of his own safety. Pt stands using bilateral UE support.   Ambulation/Gait Ambulation/Gait assistance: Min guard;Modified independent (Device/Increase time) Ambulation Distance (Feet): 520 Feet Assistive device: Rolling walker (2 wheeled) Gait Pattern/deviations: Step-through  pattern;Decreased stance time - right;Decreased weight shift to right;Decreased dorsiflexion - right;Antalgic Gait velocity: .48 m/sec or 1.59 ft/sec Gait velocity interpretation: <1.8 ft/sec, indicative of risk for recurrent falls General Gait Details: verbal cues for upright posture and equal step length   Stairs Stairs: Yes Stairs assistance: Min guard Stair Management: Backwards;With walker;No rails Number of Stairs: 2 General stair comments: Pt able to perform 2 repetitions of going up and down 2 stairs. Pt performed 1 repetition with PT student and 1 repetition with wife.  Wheelchair Mobility    Modified Rankin (Stroke Patients Only)       Balance     Sitting balance-Leahy Scale: Good       Standing balance-Leahy Scale: Good   Single Leg Stance - Right Leg: 5 (10 repetitions of 5 second stands)                  Cognition Arousal/Alertness: Awake/alert Behavior During Therapy: WFL for tasks assessed/performed Overall Cognitive Status: Within Functional Limits for tasks assessed                      Exercises Total Joint Exercises Ankle Circles/Pumps: AROM;Both;20 reps;Seated Quad Sets: 10 reps;Right;AROM;Seated Straight Leg Raises: AROM;AAROM;Right;5 reps    General Comments        Pertinent Vitals/Pain Pain Assessment: 0-10 Pain Score: 2  Pain Location: R knee Pain Descriptors / Indicators: Aching;Sore Pain Intervention(s): Limited activity within patient's tolerance;Monitored during session;Premedicated before session;Ice applied    Home Living  Prior Function            PT Goals (current goals can now be found in the care plan section) Acute Rehab PT Goals Patient Stated Goal: Pt wishes to discharge home when he is ready. Pt states a long term goal is to increase activity to better control DM. Time For Goal Achievement: 06/29/14 Potential to Achieve Goals: Good    Frequency  7X/week    PT Plan  Current plan remains appropriate    Co-evaluation             End of Session Equipment Utilized During Treatment: Gait belt Activity Tolerance: Patient tolerated treatment well Patient left: in chair;with family/visitor present     Time: 1683-7290 PT Time Calculation (min) (ACUTE ONLY): 42 min  Charges:  $Gait Training: 8-22 mins $Therapeutic Exercise: 8-22 mins $Therapeutic Activity: 8-22 mins                    G Codes:       Rachael Erastus Bartolomei Acute Rehabilitation [phone]  Myna Freimark, Rachael 06/23/2014, 2:06 PM

## 2014-06-23 NOTE — Progress Notes (Signed)
Chaplain responded to spiritual care consult. Pt in some pain from recent knee replacement. Chaplain spoke with pt and family about faith background and family structure. Pt and family have strong support system. Family is preparing for changes from this procedure. Appreciative of chaplain visit. Will follow as needed.    06/23/14 1200  Clinical Encounter Type  Visited With Patient and family together  Visit Type Initial;Spiritual support  Referral From Nurse  Spiritual Encounters  Spiritual Needs Emotional;Prayer  Stress Factors  Patient Stress Factors Family relationships;Major life changes;Health changes  Family Stress Factors Family relationships;Major life changes;Health changes  Rickey Wright, Epifanio Lesches 06/23/2014 12:21 PM

## 2014-06-23 NOTE — Progress Notes (Signed)
Patient ID: Rickey Wright, male   DOB: 02-04-1961, 53 y.o.   MRN: [id number]     Subjective:  Patient reports pain as mild to moderate.  Patient reports that he struggled with the pain last night but is better now.  Objective:   VITALS:   Filed Vitals:   06/22/14 1600 06/22/14 2100 06/23/14 0015 06/23/14 0543  BP:  147/64 137/62 151/67  Pulse:  90 91 91  Temp:  98.5 F (36.9 C) 99.7 F (37.6 C) 98.6 F (37 C)  TempSrc:  Oral Oral Oral  Resp: 18 17 17 16   SpO2:  96% 94% 96%    ABD soft Sensation intact distally Dorsiflexion/Plantar flexion intact Incision: dressing C/D/I and no drainage  Good foot and ankle motion   Lab Results  Component Value Date   WBC 11.8* 06/23/2014   HGB 12.1* 06/23/2014   HCT 36.6* 06/23/2014   MCV 85.1 06/23/2014   PLT 240 06/23/2014   BMET    Component Value Date/Time   NA 134* 06/23/2014 0558   K 4.5 06/23/2014 0558   CL 97 06/23/2014 0558   CO2 26 06/23/2014 0558   GLUCOSE 149* 06/23/2014 0558   GLUCOSE 108* 05/21/2006 1327   BUN 14 06/23/2014 0558   CREATININE 0.86 06/23/2014 0558   CALCIUM 8.5 06/23/2014 0558   GFRNONAA >90 06/23/2014 0558   GFRAA >90 06/23/2014 0558     Assessment/Plan: 1 Day Post-Op   Principal Problem:   Primary localized osteoarthritis of right knee Active Problems:   Knee osteoarthritis   Advance diet Up with therapy WBAT Dry dressing PRN Plan for DC tomorrow or Friday    Remonia Richter 06/23/2014, 7:23 AM  Seen and agree.  Marchia Bond, MD Cell [phone]

## 2014-06-23 NOTE — Progress Notes (Signed)
Physical Therapy Treatment Patient Details Name: Rickey Wright MRN: [id number] DOB: 1960-11-28 Today's Date: 06/23/2014    History of Present Illness 53 y.o. male admitted to [facility] on 06/22/14 s/p elective R TKA.  Pt with significant PMHx of multiple R knee surgeries, depression, HTN, ADD, DM 2, anemia, OA left knee, lumbosacral DDD, and L hand tendon surgery.    PT Comments    Goal of session was to assess pt's functional mobility and ability to navigate stairs with a cane. Pt has 10 stairs to reach shower upstairs, and was able to properly ascend and descend them with minimum guarding. Pt required verbal cues to slow down his pace in order to ensure safety. Pt is on track for on time discharge but would benefit continued PT in order to maximize independence and safety with mobility.   Follow Up Recommendations  Home health PT     Equipment Recommendations  Cane (Pt. ambulated well with cane; worth considering)    Recommendations for Other Services       Precautions / Restrictions Precautions Precautions: Knee Precaution Comments: Pt aware of WBAT  Required Braces or Orthoses: Knee Immobilizer - Right Knee Immobilizer - Right: On when out of bed or walking Restrictions Weight Bearing Restrictions: Yes RLE Weight Bearing: Weight bearing as tolerated    Mobility  Bed Mobility                  Transfers Overall transfer level: Modified independent Equipment used: Rolling walker (2 wheeled);Straight cane Transfers: Sit to/from Stand Sit to Stand: Supervision            Ambulation/Gait Ambulation/Gait assistance: Min guard Ambulation Distance (Feet): 100 Feet Assistive device: Straight cane Gait Pattern/deviations: Step-to pattern;Decreased stance time - right;Decreased weight shift to right;Antalgic     General Gait Details: Verbal cues to slow down pace for safety concerns   Stairs Stairs: Yes Stairs assistance: Min guard Stair Management: One rail  Right;With cane;Step to pattern;Forwards Number of Stairs: 10 General stair comments: Pt required verbal cues for proper sequencing of foot progression and to slow down for safety concerns  Wheelchair Mobility    Modified Rankin (Stroke Patients Only)       Balance Overall balance assessment: No apparent balance deficits (not formally assessed)   Sitting balance-Leahy Scale: Good       Standing balance-Leahy Scale: Good                      Cognition Arousal/Alertness: Awake/alert Behavior During Therapy: Impulsive Overall Cognitive Status: Within Functional Limits for tasks assessed                      Exercises Total Joint Exercises Long Arc Quad: 5 reps;AROM;Right;Strengthening;Seated Goniometric ROM: R Knee Flexion- AROM = 38 degrees PROM = 53 degrees    General Comments        Pertinent Vitals/Pain Pain Assessment: 0-10 Pain Score: 3  Pain Location: R knee Pain Descriptors / Indicators: Aching;Grimacing Pain Intervention(s): Limited activity within patient's tolerance;Monitored during session;Premedicated before session    Home Living                      Prior Function            PT Goals (current goals can now be found in the care plan section) Acute Rehab PT Goals Patient Stated Goal: Pt wishes to discharge home when he is ready. Pt states a long  term goal is to increase activity to better control DM. Time For Goal Achievement: 06/29/14 Potential to Achieve Goals: Good    Frequency  7X/week    PT Plan Current plan remains appropriate    Co-evaluation             End of Session Equipment Utilized During Treatment: Gait belt;Right knee immobilizer Activity Tolerance: Patient tolerated treatment well Patient left: in chair;with family/visitor present     Time: 6213-0865 PT Time Calculation (min) (ACUTE ONLY): 36 min  Charges:                       G Codes:      Orlene Plum, SPT Acute Rehab  Services [phone]  Orlene Plum 06/23/2014, 3:51 PM

## 2014-06-23 NOTE — Evaluation (Signed)
OT Cancellation Note  Patient Details Name: SHARIQ PUIG MRN: [id number] DOB: [birth year]   Cancelled Treatment:     Reason Evaluation/treatment cancelled. OT entered pt room for evaluation this morning and pt was in bathroom, wife in room. Pt ambulated from bathroom and had performed toileting and grooming standing at sink Mod I level. Pt states he has DME and wife will assist PRN at d/c. Pt/wife were educated in role of OT and report no needs at this time, s/p R TKA. Will sign off acute OT at this time.  Josephine Igo Dixon 06/23/2014, 9:25 AM

## 2014-06-23 NOTE — Discharge Instructions (Signed)
Diet: As you were doing prior to hospitalization  ° °Shower:  May shower but keep the wounds dry, use an occlusive plastic wrap, NO SOAKING IN TUB.  If the bandage gets wet, change with a clean dry gauze. ° °Dressing:  You may change your dressing 3-5 days after surgery.  Then change the dressing daily with sterile gauze dressing.   ° °There are sticky tapes (steri-strips) on your wounds and all the stitches are absorbable.  Leave the steri-strips in place when changing your dressings, they will peel off with time, usually 2-3 weeks. ° °Activity:  Increase activity slowly as tolerated, but follow the weight bearing instructions below.  No lifting or driving for 6 weeks. ° °Weight Bearing:   As tolerated.   ° °To prevent constipation: you may use a stool softener such as - ° °Colace (over the counter) 100 mg by mouth twice a day  °Drink plenty of fluids (prune juice may be helpful) and high fiber foods °Miralax (over the counter) for constipation as needed.   ° °Itching:  If you experience itching with your medications, try taking only a single pain pill, or even half a pain pill at a time.  You may take up to 10 pain pills per day, and you can also use benadryl over the counter for itching or also to help with sleep.  ° °Precautions:  If you experience chest pain or shortness of breath - call 911 immediately for transfer to the hospital emergency department!! ° °If you develop a fever greater that 101 F, purulent drainage from wound, increased redness or drainage from wound, or calf pain -- Call the office at [phone]                                                °Follow- Up Appointment:  Please call for an appointment to be seen in 2 weeks [employer] - [phone] ° °Information on my medicine - XARELTO® (Rivaroxaban) ° °This medication education was reviewed with me or my healthcare representative as part of my discharge preparation.  The pharmacist that spoke with me during my hospital stay was:  Kerria Sapien,  Kristeena Meineke Kay, RPH ° °Why was Xarelto® prescribed for you? °Xarelto® was prescribed for you to reduce the risk of blood clots forming after orthopedic surgery. The medical term for these abnormal blood clots is venous thromboembolism (VTE). ° °What do you need to know about xarelto® ? °Take your Xarelto® ONCE DAILY at the same time every day. °You may take it either with or without food. ° °If you have difficulty swallowing the tablet whole, you may crush it and mix in applesauce just prior to taking your dose. ° °Take Xarelto® exactly as prescribed by your doctor and DO NOT stop taking Xarelto® without talking to the doctor who prescribed the medication.  Stopping without other VTE prevention medication to take the place of Xarelto® may increase your risk of developing a clot. ° °After discharge, you should have regular check-up appointments with your healthcare provider that is prescribing your Xarelto®.   ° °What do you do if you miss a dose? °If you miss a dose, take it as soon as you remember on the same day then continue your regularly scheduled once daily regimen the next day. Do not take two doses of Xarelto® on the same day.  ° °Important Safety   Information °A possible side effect of Xarelto® is bleeding. You should call your healthcare provider right away if you experience any of the following: °? Bleeding from an injury or your nose that does not stop. °? Unusual colored urine (red or dark brown) or unusual colored stools (red or black). °? Unusual bruising for unknown reasons. °? A serious fall or if you hit your head (even if there is no bleeding). ° °Some medicines may interact with Xarelto® and might increase your risk of bleeding while on Xarelto®. To help avoid this, consult your healthcare provider or pharmacist prior to using any new prescription or non-prescription medications, including herbals, vitamins, non-steroidal anti-inflammatory drugs (NSAIDs) and supplements. ° °This website has more  information on Xarelto®: www.xarelto.com. ° ° ° ° ° °

## 2014-06-24 LAB — CBC
HEMATOCRIT: 34.6 % — AB (ref 39.0–52.0)
HEMOGLOBIN: 11.2 g/dL — AB (ref 13.0–17.0)
MCH: 27.1 pg (ref 26.0–34.0)
MCHC: 32.4 g/dL (ref 30.0–36.0)
MCV: 83.6 fL (ref 78.0–100.0)
Platelets: 258 10*3/uL (ref 150–400)
RBC: 4.14 MIL/uL — AB (ref 4.22–5.81)
RDW: 13.6 % (ref 11.5–15.5)
WBC: 10.6 10*3/uL — AB (ref 4.0–10.5)

## 2014-06-24 LAB — GLUCOSE, CAPILLARY
Glucose-Capillary: 195 mg/dL — ABNORMAL HIGH (ref 70–99)
Glucose-Capillary: 210 mg/dL — ABNORMAL HIGH (ref 70–99)

## 2014-06-24 NOTE — Progress Notes (Signed)
Physical Therapy Treatment Patient Details Name: Rickey Wright MRN: [id number] DOB: 10-13-1960 Today's Date: 06/24/2014    History of Present Illness 53 y.o. male admitted to [facility] on 06/22/14 s/p elective R TKA.  Pt with significant PMHx of multiple R knee surgeries, depression, HTN, ADD, DM 2, anemia, OA left knee, lumbosacral DDD, and L hand tendon surgery.    PT Comments    Pt is POD #2 and is moving well. He was able to start cane training for short distances, but has a more normal gait pattern right now while using the RW.  He was able to show proficiency on the stairs simulating both home entry and stairs to get to his bedroom.  He is concerned about thigh swelling and tightness in the knee.  I encouraged him to continue to do his exercises and ice the thigh while watching for signs of infection/compartment syndrome (tightness, shinnyness, redness, increased warmth).    Follow Up Recommendations  Home health PT     Equipment Recommendations  None recommended by PT    Recommendations for Other Services   NA     Precautions / Restrictions Precautions Precautions: Knee Precaution Comments: Pt aware of WBAT  Required Braces or Orthoses:  (d/c today) Restrictions RLE Weight Bearing: Weight bearing as tolerated    Mobility     Transfers Overall transfer level: Modified independent Equipment used: Rolling walker (2 wheeled);Straight cane Transfers: Sit to/from Stand Sit to Stand: Modified independent (Device/Increase time)         General transfer comment: Pt able to safely transition from sit to stand with use of hands for support.   Ambulation/Gait Ambulation/Gait assistance: Supervision Ambulation Distance (Feet): 250 Feet Assistive device: Rolling walker (2 wheeled) Gait Pattern/deviations: Step-through pattern;Antalgic     General Gait Details: Pt continues to have significantly antalgic gait pattern and not much knee flexion during swing (even without KI on).   Verbal cues for upright posture (pt has a tendancy to look at his feet) and heel to toe gait pattern with light hands on the RW.    Stairs Stairs: Yes Stairs assistance: Min guard Stair Management: One rail Right;No rails;Step to pattern;Backwards;Forwards;With walker;With cane Number of Stairs: 10 General stair comments: Pt able to verbalize correct leg sequencing on the steps with both the RW backwards and SPC forwards with the rail.  We simulated both home entry and stairs to get up to the next level of his home.          Balance Overall balance assessment: Needs assistance Sitting-balance support: Feet supported;No upper extremity supported Sitting balance-Leahy Scale: Good     Standing balance support: Single extremity supported;Bilateral upper extremity supported;No upper extremity supported Standing balance-Leahy Scale: Good                      Cognition Arousal/Alertness: Awake/alert Behavior During Therapy: WFL for tasks assessed/performed Overall Cognitive Status: Within Functional Limits for tasks assessed                      Exercises Total Joint Exercises Heel Slides: AROM;Right;10 reps;Seated (10 second holds each)        Pertinent Vitals/Pain Pain Assessment: 0-10 Pain Score: 4  Pain Location: knee, right Pain Descriptors / Indicators: Aching;Burning Pain Intervention(s): Limited activity within patient's tolerance;Monitored during session;Repositioned;Ice applied     PT Goals (current goals can now be found in the care plan section) Acute Rehab PT Goals Patient Stated Goal: Pt  wishes to discharge home when he is ready. Pt states a long term goal is to increase activity to better control DM. Progress towards PT goals: Progressing toward goals    Frequency  7X/week    PT Plan Current plan remains appropriate       End of Session   Activity Tolerance: Patient limited by pain Patient left: in chair;with call bell/phone within  reach     Time: 8984-2103 PT Time Calculation (min) (ACUTE ONLY): 25 min  Charges:  $Gait Training: 8-22 mins $Therapeutic Exercise: 8-22 mins                      Ioanna Colquhoun B. Darria Corvera, PT, DPT [phone]   06/24/2014, 1:06 PM

## 2014-06-24 NOTE — Plan of Care (Signed)
Problem: Discharge Progression Outcomes Goal: Barriers To Progression Addressed/Resolved Outcome: Completed/Met Date Met:  06/24/14 Goal: CMS/Neurovascular status at or above baseline Outcome: Completed/Met Date Met:  06/24/14 Goal: Anticoagulant follow-up in place Outcome: Not Applicable Date Met:  71/95/97 Goal: Pain controlled with appropriate interventions Outcome: Completed/Met Date Met:  06/24/14 Goal: Hemodynamically stable Outcome: Completed/Met Date Met:  47/18/55 Goal: Complications resolved/controlled Outcome: Completed/Met Date Met:  06/24/14 Goal: Tolerates diet Outcome: Completed/Met Date Met:  06/24/14 Goal: Activity appropriate for discharge plan Outcome: Completed/Met Date Met:  06/24/14 Goal: Ambulates safely using assistive device Outcome: Completed/Met Date Met:  06/24/14 Goal: Follows weight - bearing limitations Outcome: Completed/Met Date Met:  06/24/14 Goal: Discharge plan in place and appropriate Outcome: Completed/Met Date Met:  06/24/14 Goal: Demonstrates ADLs as appropriate Outcome: Completed/Met Date Met:  06/24/14

## 2014-06-24 NOTE — Progress Notes (Signed)
Patient ID: Rickey Wright, male   DOB: Aug 11, 1960, 53 y.o.   MRN: [id number]     Subjective:  Patient reports pain as mild.  Patient sitting up in the chair and doing well  Objective:   VITALS:   Filed Vitals:   06/23/14 2116 06/23/14 2133 06/24/14 0000 06/24/14 0555  BP: 122/62   132/74  Pulse: 106   100  Temp: 100.4 F (38 C) 100.4 F (38 C)  99.5 F (37.5 C)  TempSrc: Oral Oral  Oral  Resp:   18   SpO2: 93%  93% 91%    ABD soft Sensation intact distally Dorsiflexion/Plantar flexion intact Incision: dressing C/D/I and no drainage Wound clean and dry no sign of infection Good knee and ankle motion  Lab Results  Component Value Date   WBC 11.8* 06/23/2014   HGB 12.1* 06/23/2014   HCT 36.6* 06/23/2014   MCV 85.1 06/23/2014   PLT 240 06/23/2014   BMET    Component Value Date/Time   NA 134* 06/23/2014 0558   K 4.5 06/23/2014 0558   CL 97 06/23/2014 0558   CO2 26 06/23/2014 0558   GLUCOSE 149* 06/23/2014 0558   GLUCOSE 108* 05/21/2006 1327   BUN 14 06/23/2014 0558   CREATININE 0.86 06/23/2014 0558   CALCIUM 8.5 06/23/2014 0558   GFRNONAA >90 06/23/2014 0558   GFRAA >90 06/23/2014 0558     Assessment/Plan: 2 Days Post-Op   Principal Problem:   Primary localized osteoarthritis of right knee Active Problems:   Knee osteoarthritis   Advance diet Up with therapy Discharge home with home health Work with PT this AM and DC home Dry dressing PRN WBAT   DOUGLAS PARRY, BRANDON 06/24/2014, 7:22 AM  Discussed and agree.  Marchia Bond, MD Cell [phone]

## 2014-06-24 NOTE — Care Management Note (Signed)
CARE MANAGEMENT NOTE 06/24/2014  Patient:  Rickey Wright, Rickey Wright   Account Number:  [id number]  Date Initiated:  06/24/2014  Documentation initiated by:  Ricki Miller  Subjective/Objective Assessment:   53 yr old male admitted with DJD of right knee. patient had a right total knee arthroplasty.     Action/Plan:   Case manager spoke with patient and wife concerning home health and DME needs. Patient preoperatively setup with [facility], no changes.   Anticipated DC Date:  06/24/2014   Anticipated DC Plan:  [place]  CM consult      [facility] Choice  HOME HEALTH  DURABLE MEDICAL EQUIPMENT   Choice offered to / List presented to:  C-1 Patient   DME arranged  [place]  3-N-1      DME agency  [employer]     HH arranged  HH-2 PT      [place].   Status of service:  Completed, signed off Medicare Important Message given?   (If response is "NO", the following Medicare IM given date fields will be blank) Date Medicare IM given:   Medicare IM given by:   Date Additional Medicare IM given:   Additional Medicare IM given by:    Discharge Disposition:  [place]  Per UR Regulation:  Reviewed for med. necessity/level of care/duration of stay  If discussed at [place] of Stay Meetings, dates discussed:    Comments:

## 2014-06-24 NOTE — Discharge Summary (Signed)
Physician Discharge Summary  Patient ID: Rickey Wright MRN: [id number] DOB/AGE: [birth year] 53 y.o.  Admit date: 06/22/2014 Discharge date: 06/24/2014  Admission Diagnoses:  Primary localized osteoarthritis of right knee  Discharge Diagnoses:  Principal Problem:   Primary localized osteoarthritis of right knee Active Problems:   Knee osteoarthritis   Past Medical History  Diagnosis Date  . Allergy   . Depression   . Hyperlipidemia   . Hypertension   . ADD (attention deficit disorder)   . OSA (obstructive sleep apnea)     mild not on cpap  . Loose body of left knee 06/27/2012  . Diabetes mellitus, type 2   . Asthma     "sports induced only"  . Hypothyroidism   . Anemia     hx iron deficiency   . Arthritis     "knees, shoulders, wrists, ankles, back" (06/22/2014)  . Localized osteoarthritis of left knee 06/27/2012  . Primary localized osteoarthritis of right knee 06/22/2014  . DDD (degenerative disc disease), lumbosacral     Surgeries: Procedure(s): RIGHT TOTAL KNEE ARTHROPLASTY on 06/22/2014   Consultants (if any):    Discharged Condition: Improved  Hospital Course: Rickey Wright is an 53 y.o. male who was admitted 06/22/2014 with a diagnosis of Primary localized osteoarthritis of right knee and went to the operating room on 06/22/2014 and underwent the above named procedures.    He was given perioperative antibiotics:  Anti-infectives    Start     Dose/Rate Route Frequency Ordered Stop   06/22/14 1500  ceFAZolin (ANCEF) IVPB 2 g/50 mL premix     2 g100 mL/hr over 30 Minutes Intravenous Every 6 hours 06/22/14 1400 06/22/14 2044   06/22/14 0600  ceFAZolin (ANCEF) IVPB 2 g/50 mL premix     2 g100 mL/hr over 30 Minutes Intravenous On call to O.R. 06/21/14 1506 06/22/14 0740    .  He was given sequential compression devices, early ambulation, and xarelto for DVT prophylaxis.  He benefited maximally from the hospital stay and there were no complications.     Recent vital signs:  Filed Vitals:   06/24/14 0555  BP: 132/74  Pulse: 100  Temp: 99.5 F (37.5 C)  Resp:     Recent laboratory studies:  Lab Results  Component Value Date   HGB 11.2* 06/24/2014   HGB 12.1* 06/23/2014   HGB 14.5 06/11/2014   Lab Results  Component Value Date   WBC 10.6* 06/24/2014   PLT 258 06/24/2014   No results found for: INR Lab Results  Component Value Date   NA 134* 06/23/2014   K 4.5 06/23/2014   CL 97 06/23/2014   CO2 26 06/23/2014   BUN 14 06/23/2014   CREATININE 0.86 06/23/2014   GLUCOSE 149* 06/23/2014    Discharge Medications:     Medication List    STOP taking these medications        ibuprofen 200 MG tablet  Commonly known as:  ADVIL,MOTRIN      TAKE these medications        aspirin 81 MG chewable tablet  Chew 81 mg by mouth daily.     atorvastatin 10 MG tablet  Commonly known as:  LIPITOR  Take 10 mg by mouth daily.     baclofen 10 MG tablet  Commonly known as:  LIORESAL  Take 1 tablet (10 mg total) by mouth 3 (three) times daily. As needed for muscle spasm     benazepril 10 MG tablet  Commonly known  as:  LOTENSIN  Take 10 mg by mouth daily.     ezetimibe 10 MG tablet  Commonly known as:  ZETIA  Take 10 mg by mouth daily.     fish oil-omega-3 fatty acids 1000 MG capsule  Take 2 g by mouth daily.     LANTUS 100 UNIT/ML injection  Generic drug:  insulin glargine  Inject 40 Units into the skin every morning.     levothyroxine 50 MCG tablet  Commonly known as:  SYNTHROID, LEVOTHROID  Take 50 mcg by mouth daily before breakfast.     loratadine-pseudoephedrine 5-120 MG per tablet  Commonly known as:  CLARITIN-D 12-hour  Take 1 tablet by mouth daily.     metFORMIN 500 MG tablet  Commonly known as:  GLUCOPHAGE  - 2 qam and 3 qhs  -      multivitamin with minerals tablet  Take 1 tablet by mouth daily.     ondansetron 4 MG tablet  Commonly known as:  ZOFRAN  Take 1 tablet (4 mg total) by mouth every 8  (eight) hours as needed for nausea or vomiting.     oxyCODONE-acetaminophen 10-325 MG per tablet  Commonly known as:  PERCOCET  Take 1-2 tablets by mouth every 6 (six) hours as needed for pain. MAXIMUM TOTAL ACETAMINOPHEN DOSE IS 4000 MG PER DAY     pioglitazone 45 MG tablet  Commonly known as:  ACTOS  Take 45 mg by mouth daily.     rivaroxaban 10 MG Tabs tablet  Commonly known as:  XARELTO  Take 1 tablet (10 mg total) by mouth daily.     sennosides-docusate sodium 8.6-50 MG tablet  Commonly known as:  SENOKOT-S  Take 2 tablets by mouth daily.     VICTOZA 18 MG/3ML Soln injection  Generic drug:  Liraglutide  Inject 1.8 mg into the skin daily.     Vitamin D 2000 UNITS Caps  Take 2,000 Units by mouth daily.        Diagnostic Studies: Dg Knee Right Port  06/22/2014   CLINICAL DATA:  Status post right total knee replacement using cement.  EXAM: PORTABLE RIGHT KNEE - 1-2 VIEW  COMPARISON:  None.  FINDINGS: The femoral and tibial components appear to be well situated. Expected postoperative gas is seen in soft tissues anterior to distal femur.  IMPRESSION: Status post right total knee arthroplasty.   Electronically Signed   By: Sabino Dick M.D.   On: 06/22/2014 11:06    Disposition: 01-Home or Self Care        Follow-up Information    Follow up with Johnny Bridge, MD. Schedule an appointment as soon as possible for a visit in 2 weeks.   Specialty:  Orthopedic Surgery   Contact information:   [place] [address] [phone]        Signed: Johnny Bridge 06/24/2014, 10:19 AM

## 2014-07-05 ENCOUNTER — Telehealth: Payer: Self-pay | Admitting: Internal Medicine

## 2014-07-05 NOTE — Telephone Encounter (Signed)
Pt notified that he had tdap on

## 2014-07-05 NOTE — Telephone Encounter (Signed)
02/05/11 

## 2014-07-05 NOTE — Telephone Encounter (Signed)
LM with Nunzio Cory (wife) for a return call.

## 2014-07-05 NOTE — Telephone Encounter (Signed)
Pt questions if he has had a recent Tdap as his daughter is having a baby in the near future and the family wants to know if they need add'l injectios.

## 2014-09-04 ENCOUNTER — Telehealth: Payer: Self-pay | Admitting: Internal Medicine

## 2014-09-04 NOTE — Telephone Encounter (Signed)
[facility] Night - Client TELEPHONE [facility] Patient Name: Rickey Wright DOB: 10-30-1960 Initial Comment Caller states, trying to make an appt, husband cut his finger early this morning, stopped bleeding, he is a diabetic, Nurse Assessment Nurse: Donalynn Furlong, RN, Myna Hidalgo Date/Time Eilene Ghazi Time): 09/04/2014 10:00:54 AM Confirm and document reason for call. If symptomatic, describe symptoms. ---Caller states, trying to make an appt, husband has a puppy "tooth cut, not a puncture wound " finger early this morning, stopped bleeding, he is a diabetic, Puppy is approaching "3rd round of shots/all appropriate Has the patient traveled out of the country within the last 30 days? ---No Does the patient require triage? ---Yes Related visit to physician within the last 2 weeks? ---Yes Does the PT have any chronic conditions? (i.e. diabetes, asthma, etc.) ---Yes List chronic conditions. ---diabetes Guidelines Guideline Title Affirmed Question Affirmed Notes Animal Bite [1] Puncture wound or small cut AND [2] on hands or genitals Final Disposition User See Physician within 4 Hours (or PCP triage) Donalynn Furlong, RN, Myna Hidalgo Comments Pt [name] wanted to take him to [facility] as their understanding was they had a clinic open today which would fall under the 4 hr guideline "to be seen". As the office line at 10:15 states they are closed, as well as no answer on the backline, caller states she will take her husband to [facility]

## 2014-09-06 NOTE — Telephone Encounter (Signed)
Noted  

## 2014-12-29 ENCOUNTER — Telehealth: Payer: Self-pay | Admitting: Internal Medicine

## 2014-12-29 NOTE — Telephone Encounter (Signed)
[facility] - Client [facility]  Patient Name: Rickey Wright  Gender: Male  DOB: Apr 15, 1961   Age: 54 Y 13 M 3 D  Return Phone Number: [phone] (Primary)  Address:   City/State/Zip: [place] [address]   Client [facility] - Client  Client Site [facility] - Day  Physician Panosh, [place] Type Call  Call Type Triage / Clinical  Relationship To Patient Self  Appointment Disposition EMR Appointment Attempted - Not Scheduled  Info pasted into Epic Yes  Return Phone Number [phone] (Primary)  Chief Complaint Headache  Initial Comment Caller States sinus congestion, sinus headaches, sore throat, drainage, no fever  PreDisposition Home Care   Nurse Assessment  Nurse: Buck Mam, RN, Trish Date/Time (Eastern Time): 12/29/2014 8:37:13 AM  Confirm and document reason for call. If symptomatic, describe symptoms. ---Patient is calling for self and sinus congestion, sinus headaches, sore throat, drainage, no fever. Sx started 2 days ago. He is taking Claritin D emergency cough drops cough drops. He is supposed to fly out in 1 week. NO temp. eating and drinking fine.  Has the patient traveled out of the country within the last 30 days? ---No  Does the patient require triage? ---Yes  Related visit to physician within the last 2 weeks? ---No  Does the PT have any chronic conditions? (i.e. diabetes, asthma, etc.) ---Yes  List chronic conditions. ---sinusitis allergy.     Guidelines      Guideline Title Affirmed Question Affirmed Notes Nurse Date/Time Eilene Ghazi Time)  Sinus Pain or Congestion [1] Sinus congestion as part of a cold AND [2] present < 10 days (all triage questions negative)  Maples, RN, Trish 12/29/2014 8:41:14 AM   Disp. Time Eilene Ghazi Time) Disposition Final User          12/29/2014 8:45:18 AM Home Care Yes Buck Mam, RN, Trish        Caller Understands:  Yes  Disagree/Comply: Comply     Care Advice Given Per Guideline      HOME CARE: You should be able to treat this at home. * Sinus congestion is a normal part of a cold. REASSURANCE: * STEP 1: Lean over a sink. * STEP 2: Gently squirt or spray warm salt water into one of your nostrils. * STEP 3: Some of the water may run into the back of your throat. Spit this out. If you swallow the salt water it will not hurt you. * STEP 4: Blow your nose to clean out the water and mucus. * STEP 5: Repeat steps 1-4 for the other nostril. You can do this a couple times a day if it seems to help you. * PSEUDOEPHEDRINE (Sudafed) is available OTC in pill form. Typical adult dosage is two 30 mg tablets every 6 hours. * Sinus congestion from viral upper respiratory infections (colds) usually lasts 5-10 days. EXPECTED COURSE: CALL BACK IF: * Severe pain persists over 2 hours after pain medicine * Sinus pain persists over 1 day after using nasal washes * Sinus congestion (fullness) persists over 10 days * Fever lasts over 3 days * You become worse. CARE ADVICE given per Sinus Pain or Congestion (Adult) guideline.   After Care Instructions Given     Call Event Type User Date / Time Description

## 2014-12-29 NOTE — Telephone Encounter (Signed)
Spoke with MD Sarajane Jews and he advises patient does not warrant an appointment and home care advice was appropriate.

## 2015-01-31 ENCOUNTER — Other Ambulatory Visit: Payer: Self-pay

## 2015-08-26 LAB — HM DIABETES EYE EXAM

## 2015-08-30 ENCOUNTER — Encounter: Payer: Self-pay | Admitting: Family Medicine

## 2015-09-08 ENCOUNTER — Other Ambulatory Visit: Payer: Self-pay | Admitting: Orthopedic Surgery

## 2015-09-08 DIAGNOSIS — M545 Low back pain: Secondary | ICD-10-CM

## 2015-09-17 ENCOUNTER — Ambulatory Visit
Admission: RE | Admit: 2015-09-17 | Discharge: 2015-09-17 | Disposition: A | Discharge status: Home / Self Care | Payer: BLUE CROSS/BLUE SHIELD | Source: Ambulatory Visit | Attending: Orthopedic Surgery | Admitting: Orthopedic Surgery

## 2015-09-17 DIAGNOSIS — M545 Low back pain: Secondary | ICD-10-CM

## 2015-11-18 IMAGING — CR DG KNEE 1-2V PORT*R*
2 series · 2 of 2 positions shown · non-contrast
Comparison: None.

CLINICAL DATA: Status post right total knee replacement using
cement.

EXAM:
PORTABLE RIGHT KNEE - 1-2 VIEW

[AP]
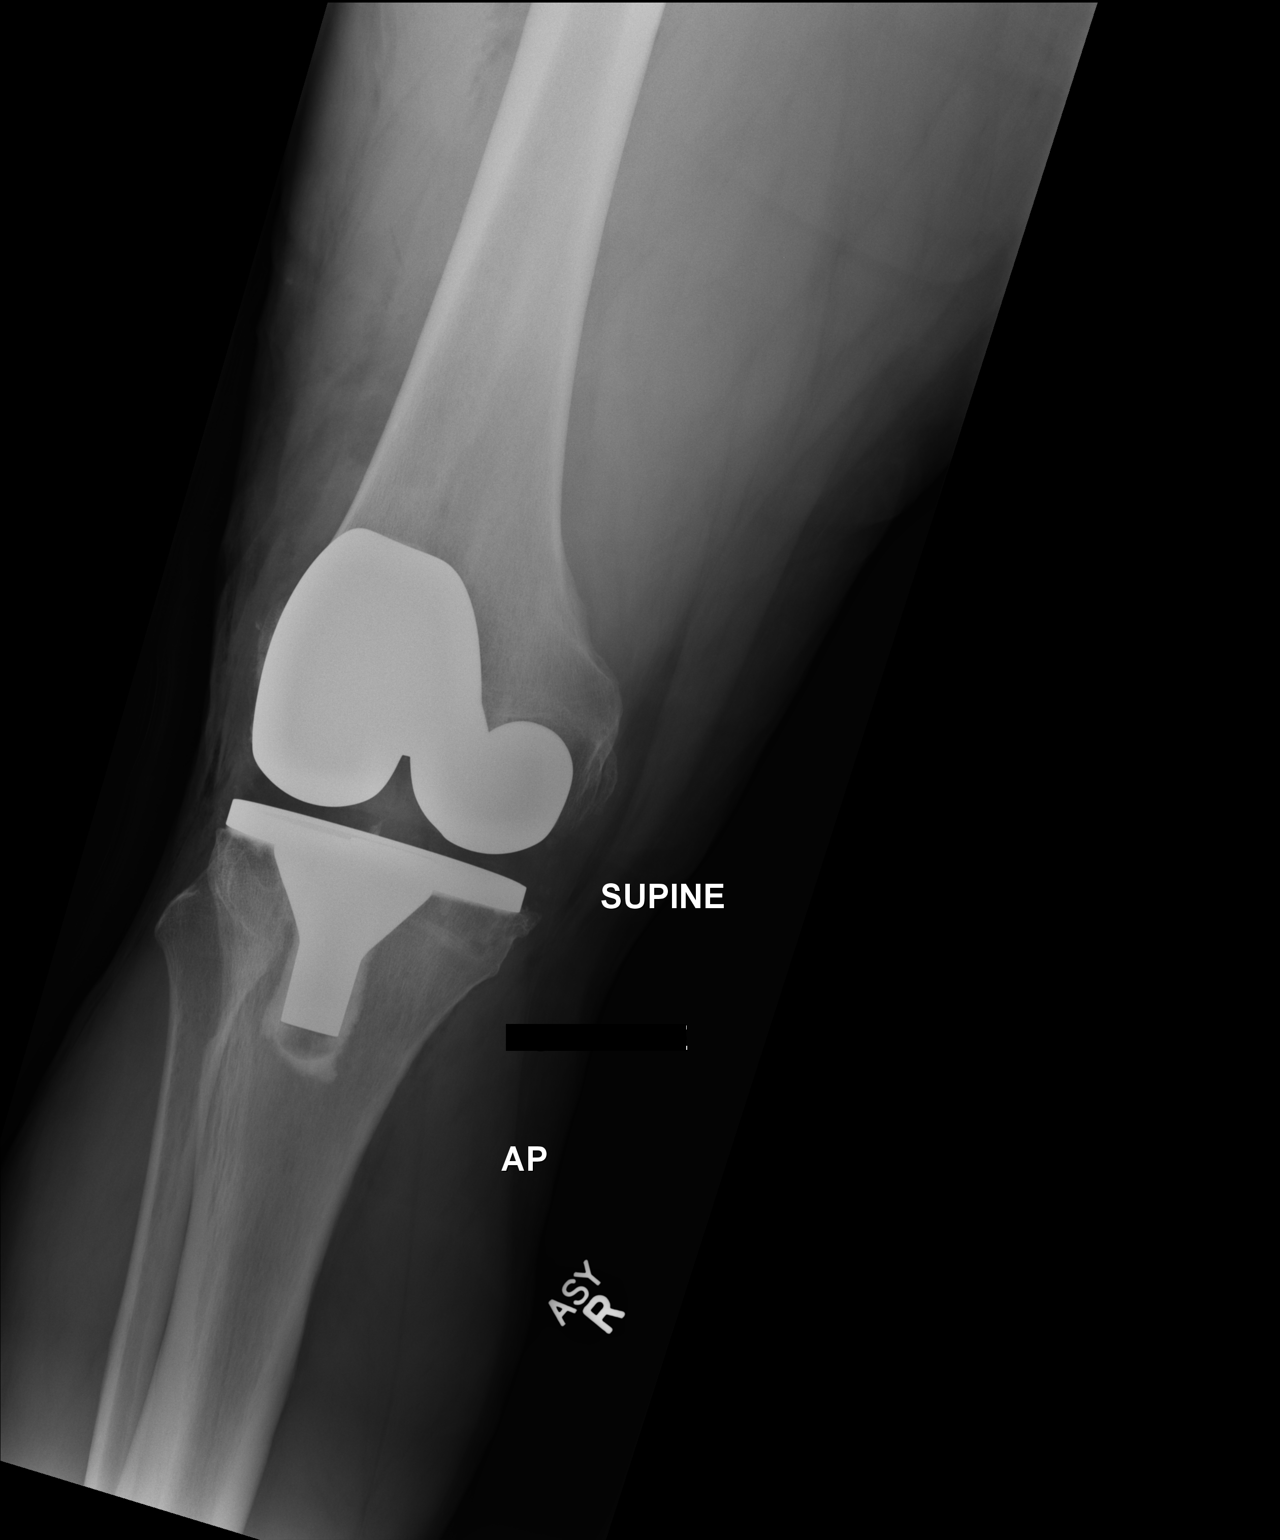

[lateral]
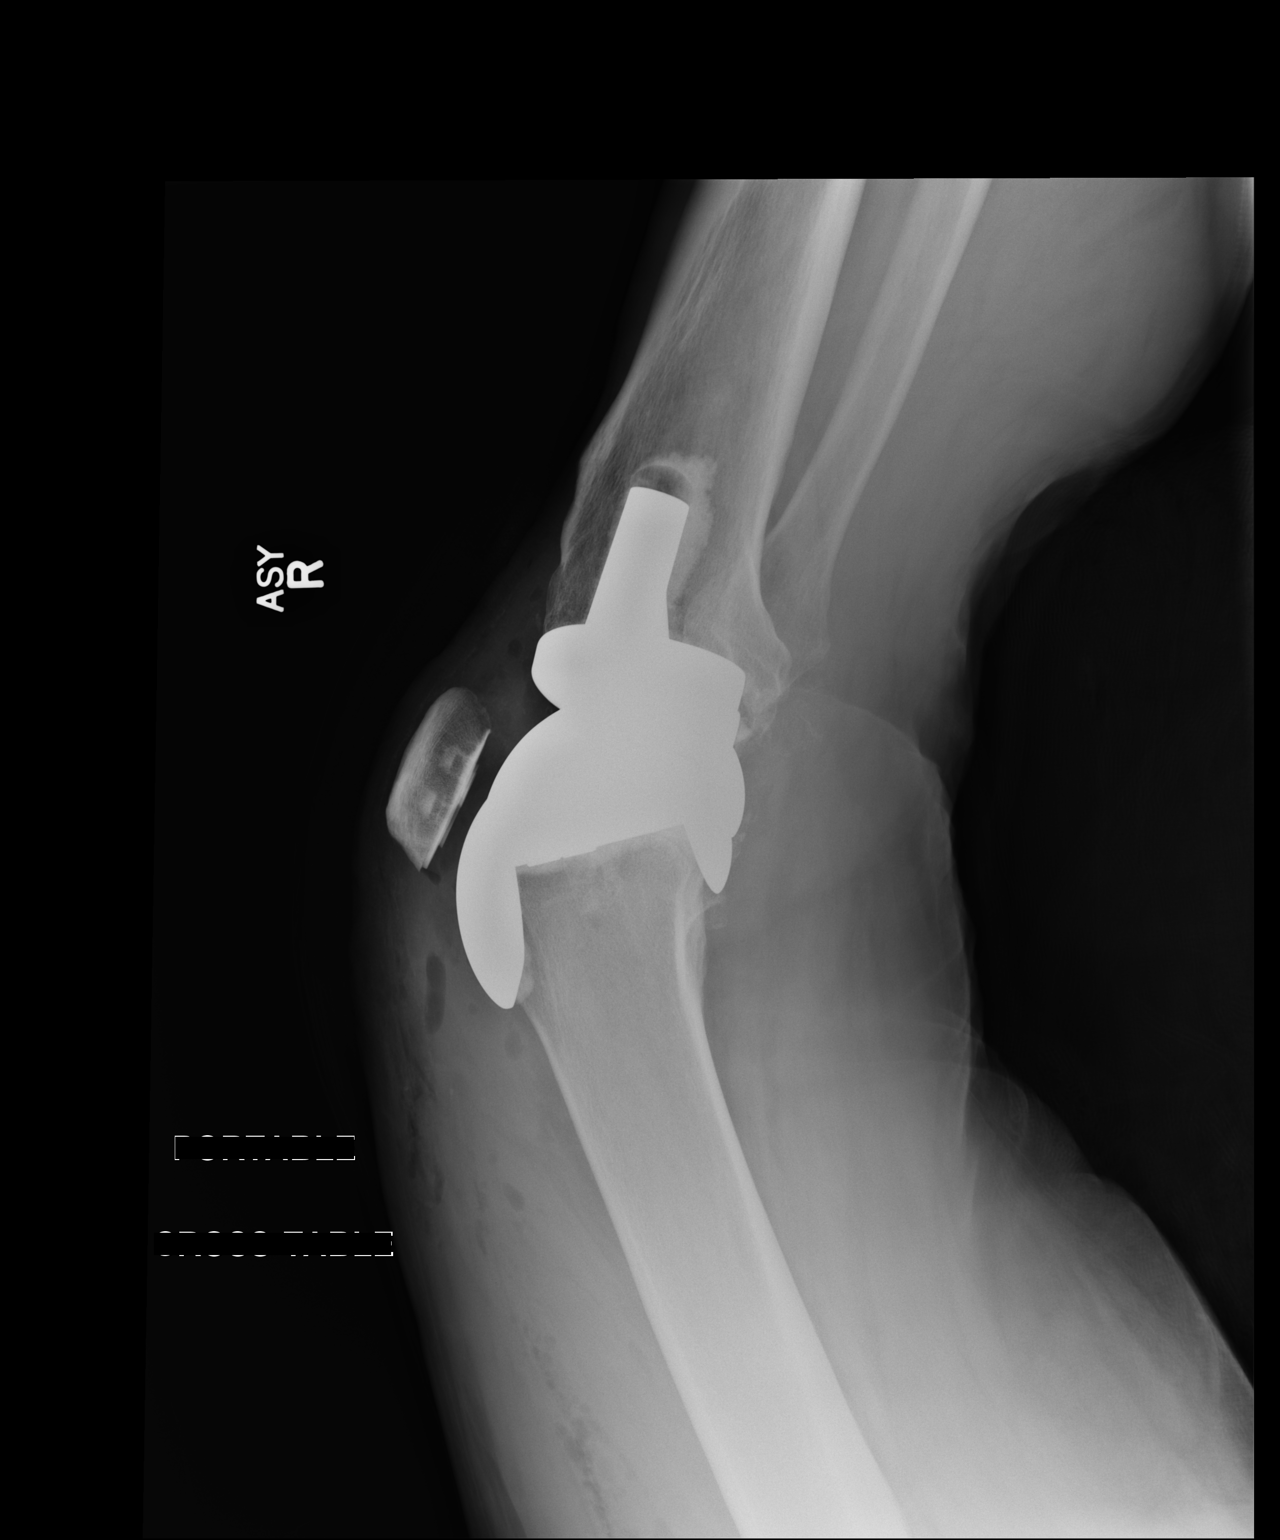

[2 of 2 positions shown; findings below may reference images not displayed]

FINDINGS: The femoral and tibial components appear to be well situated.
Expected postoperative gas is seen in soft tissues anterior to
distal femur.
IMPRESSION: Status post right total knee arthroplasty.

## 2015-11-24 ENCOUNTER — Encounter: Payer: Self-pay | Admitting: Internal Medicine

## 2015-11-24 ENCOUNTER — Ambulatory Visit (INDEPENDENT_AMBULATORY_CARE_PROVIDER_SITE_OTHER): Payer: BLUE CROSS/BLUE SHIELD | Admitting: Internal Medicine

## 2015-11-24 ENCOUNTER — Ambulatory Visit (INDEPENDENT_AMBULATORY_CARE_PROVIDER_SITE_OTHER)
Admission: RE | Admit: 2015-11-24 | Discharge: 2015-11-24 | Disposition: A | Discharge status: Home / Self Care | Payer: BLUE CROSS/BLUE SHIELD | Source: Ambulatory Visit | Attending: Internal Medicine | Admitting: Internal Medicine

## 2015-11-24 VITALS — BP 126/60 | HR 85 | Temp 99.8°F | Wt 249.3 lb

## 2015-11-24 DIAGNOSIS — J309 Allergic rhinitis, unspecified: Secondary | ICD-10-CM | POA: Diagnosis not present

## 2015-11-24 DIAGNOSIS — J988 Other specified respiratory disorders: Secondary | ICD-10-CM

## 2015-11-24 DIAGNOSIS — R5381 Other malaise: Secondary | ICD-10-CM

## 2015-11-24 DIAGNOSIS — R509 Fever, unspecified: Secondary | ICD-10-CM | POA: Diagnosis not present

## 2015-11-24 DIAGNOSIS — R5383 Other fatigue: Secondary | ICD-10-CM

## 2015-11-24 DIAGNOSIS — J22 Unspecified acute lower respiratory infection: Secondary | ICD-10-CM

## 2015-11-24 MED ORDER — ALBUTEROL SULFATE HFA 108 (90 BASE) MCG/ACT IN AERS: 2.0000 | INHALATION_SPRAY | Freq: Four times a day (QID) | RESPIRATORY_TRACT | Status: DC | PRN

## 2015-11-24 NOTE — Patient Instructions (Addendum)
Get x ray because  Of  Low grade fever and cough  But this could be viral and   Allergy sinus . If  persistent or progressive consider Antibiotic therapy . Rest and fluids .  Could be a viral infection to run its course  .

## 2015-11-24 NOTE — Progress Notes (Signed)
Pre visit review using our clinic review tool, if applicable. No additional management support is needed unless otherwise documented below in the visit note.   Chief Complaint  Patient presents with  . Cough    Started 2 weeks ago  . Post Nasal Drip  . Headache  . Sore Throat    HPI: Rickey Wright 55 y.o.  comesin today with sx over  2 weeks    exhausted during after  trip to ( work)[place].  Also child illness resp .    More too it  .than a head cold     3 dasy o finreadsing mostly dry cough  Hx of wheezing in past but no asthma does have some allergies   Using otc Claritin NS  And helps  No face pain like  A ref sinus infection tired  sharp cp left  And  r  Just  tired   ? chilled and achy   Took off  From work this week not really sob but has dec exercise tolerance  Dm reasonable control  utd on labs.    Has had back injections.  For drop foot.  Helped .  ROS: See pertinent positives and negatives per HPI.  Past Medical History  Diagnosis Date  . Allergy   . Depression   . Hyperlipidemia   . Hypertension   . ADD (attention deficit disorder)   . OSA (obstructive sleep apnea)     mild not on cpap  . Loose body of left knee 06/27/2012  . Diabetes mellitus, type 2 ([place])   . Asthma     "sports induced only"  . Hypothyroidism   . Anemia     hx iron deficiency   . Arthritis     "knees, shoulders, wrists, ankles, back" (06/22/2014)  . Localized osteoarthritis of left knee 06/27/2012  . Primary localized osteoarthritis of right knee 06/22/2014  . DDD (degenerative disc disease), lumbosacral     Family History  Problem Relation Age of Onset  . Colon polyps Mother   . Heart disease Father     20s   . Cancer Maternal Grandfather     colon  . Diabetes Paternal Grandmother   . Seizures Daughter   . Thyroid disease Daughter   . Aneurysm Father     AAA smoker  . ADD / ADHD Son   . Learning disabilities Son     Social History   Social History  . Marital Status:  Married    Spouse Name: N/A  . Number of Children: N/A  . Years of Education: N/A   Social History Main Topics  . Smoking status: Never Smoker   . Smokeless tobacco: Never Used  . Alcohol Use: Yes     Comment: very rarely, maybe 2 a yr..  . Drug Use: No  . Sexual Activity: Yes   Other Topics Concern  . None   Social History Narrative   5 children  Married    Education officer, museum with work   Non smoker    H H of 7 pet dog    Outpatient Prescriptions Prior to Visit  Medication Sig Dispense Refill  . aspirin 81 MG chewable tablet Chew 81 mg by mouth daily.      [name] atorvastatin (LIPITOR) 10 MG tablet Take 10 mg by mouth daily.      . benazepril (LOTENSIN) 10 MG tablet Take 10 mg by mouth daily.      . Cholecalciferol (VITAMIN D) 2000 UNITS CAPS Take  2,000 Units by mouth daily.    [name] ezetimibe (ZETIA) 10 MG tablet Take 10 mg by mouth daily.      . fish oil-omega-3 fatty acids 1000 MG capsule Take 2 g by mouth daily.      . insulin glargine (LANTUS) 100 UNIT/ML injection Inject 40 Units into the skin every morning.     . Liraglutide (VICTOZA) 18 MG/3ML SOLN Inject 1.8 mg into the skin daily.     [name] loratadine-pseudoephedrine (CLARITIN-D 12-HOUR) 5-120 MG per tablet Take 1 tablet by mouth daily.    . metFORMIN (GLUCOPHAGE) 500 MG tablet 2 qam and 3 qhs     . pioglitazone (ACTOS) 45 MG tablet Take 45 mg by mouth daily.     . sennosides-docusate sodium (SENOKOT-S) 8.6-50 MG tablet Take 2 tablets by mouth daily. 30 tablet 1  . levothyroxine (SYNTHROID, LEVOTHROID) 50 MCG tablet Take 50 mcg by mouth daily before breakfast.    . baclofen (LIORESAL) 10 MG tablet Take 1 tablet (10 mg total) by mouth 3 (three) times daily. As needed for muscle spasm 50 tablet 0  . Multiple Vitamins-Minerals (MULTIVITAMIN WITH MINERALS) tablet Take 1 tablet by mouth daily.    . ondansetron (ZOFRAN) 4 MG tablet Take 1 tablet (4 mg total) by mouth every 8 (eight) hours as needed for nausea or vomiting. 30 tablet 0  .  oxyCODONE-acetaminophen (PERCOCET) 10-325 MG per tablet Take 1-2 tablets by mouth every 6 (six) hours as needed for pain. MAXIMUM TOTAL ACETAMINOPHEN DOSE IS 4000 MG PER DAY 75 tablet 0  . rivaroxaban (XARELTO) 10 MG TABS tablet Take 1 tablet (10 mg total) by mouth daily. 21 tablet 0   No facility-administered medications prior to visit.     EXAM:  BP 126/60 mmHg  Pulse 85  Temp(Src) 99.8 F (37.7 C) (Oral)  Wt 249 lb 4.8 oz (113.082 kg)  SpO2 98%  Body mass index is 35.77 kg/(m^2).  GENERAL: vitals reviewed and listed above, alert, oriented, appears well hydrated and in no acute distress looks tired non toxic  Mild congestion  HEENT: atraumatic, conjunctiva  clear, no obvious abnormalities on inspection of external nose and ears  Nares congested face non tenderOP : no lesion edema or exudate mild erythema  NECK: no obvious masses on inspection palpation  LUNGS r sided musical sounds  / bs good no  No rales or rhonchi  CV: HRRR, no clubbing cyanosis or  peripheral edema nl cap refill  Abdomen:  Sof,t normal bowel sounds without hepatosplenomegaly, no guarding rebound or masses no CVA tenderness MS: moves all extremities without noticeable focal  Abnormality well healed knee scar  PSYCH: pleasant and cooperative, no obvious depression or anxiety  ASSESSMENT AND PLAN:  Discussed the following assessment and plan:  Acute respiratory infection - Plan: DG Chest 2 View  Low grade fever  Malaise and fatigue  Allergic rhinitis, unspecified allergic rhinitis type Add  albuterol as needed has hx of wheezing on exam in past  But getting c xray rr/o pna becauseo fo rest of sx complex .  -Patient advised to return or notify health care team  if symptoms worsen ,persist or new concerns arise. reports that diabetes and thyroid is in range  Patient Instructions  Get x ray because  Of  Low grade fever and cough  But this could be viral and   Allergy sinus . If  persistent or progressive  consider Antibiotic therapy . Rest and fluids .  Could be a viral  infection to run its course  .       Standley Brooking. Medard Decuir M.D.

## 2015-11-28 ENCOUNTER — Telehealth: Payer: Self-pay | Admitting: Internal Medicine

## 2015-11-28 MED ORDER — AMOXICILLIN-POT CLAVULANATE 875-125 MG PO TABS: 1.0000 | ORAL_TABLET | Freq: Two times a day (BID) | ORAL | Status: DC

## 2015-11-28 NOTE — Telephone Encounter (Signed)
Pt said he was told to call back if he was not feeling better. He call today to say he is not feeling any better and would like a call back.

## 2015-11-28 NOTE — Telephone Encounter (Signed)
Continues to have cough (using Delsym)  Cough has become productive of light green sputum.  Pt has not checked his temperature. No chills.  Using the inhaler. Gives some relief.

## 2015-11-28 NOTE — Telephone Encounter (Signed)
Left a message for a return call.

## 2015-11-28 NOTE — Telephone Encounter (Signed)
Pt returned my call at some point today and left a message on my machine.  I tried to reach him.  Left a message for a return call.

## 2015-11-28 NOTE — Telephone Encounter (Signed)
augmentin 875 1 [po bid for 7 days disp 14

## 2015-11-28 NOTE — Telephone Encounter (Signed)
Sent to the pharmacy by e-scribe.  Pt notified. 

## 2016-08-19 ENCOUNTER — Ambulatory Visit (INDEPENDENT_AMBULATORY_CARE_PROVIDER_SITE_OTHER): Payer: Self-pay | Admitting: Family

## 2016-08-19 VITALS — BP 110/80 | HR 98 | Temp 98.8°F | Wt 255.0 lb

## 2016-08-19 DIAGNOSIS — J209 Acute bronchitis, unspecified: Secondary | ICD-10-CM

## 2016-08-19 DIAGNOSIS — R059 Cough, unspecified: Secondary | ICD-10-CM

## 2016-08-19 DIAGNOSIS — R05 Cough: Secondary | ICD-10-CM

## 2016-08-19 MED ORDER — DOXYCYCLINE HYCLATE 100 MG PO TABS: 100.0000 mg | ORAL_TABLET | Freq: Two times a day (BID) | ORAL | 0 refills | Status: DC

## 2016-08-19 MED ORDER — BENZONATATE 200 MG PO CAPS: 200.0000 mg | ORAL_CAPSULE | Freq: Two times a day (BID) | ORAL | 0 refills | Status: DC | PRN

## 2016-08-19 MED ORDER — ALBUTEROL SULFATE HFA 108 (90 BASE) MCG/ACT IN AERS: 2.0000 | INHALATION_SPRAY | Freq: Four times a day (QID) | RESPIRATORY_TRACT | 2 refills | Status: DC | PRN

## 2016-08-19 NOTE — Patient Instructions (Signed)

## 2016-08-19 NOTE — Progress Notes (Signed)
Subjective:     Patient ID: Rickey Wright, male   DOB: [birth year], 56 y.o.   MRN: [id number]  HPI  56 year old male with a history of DM, is in today with c/o cough, congestion, x 3 weeks. He has been taking OTC meds w/o much relief. No fever. Cough worse with laying down.  Review of Systems Past Medical History:  Diagnosis Date  . ADD (attention deficit disorder)   . Allergy   . Anemia    hx iron deficiency   . Arthritis    "knees, shoulders, wrists, ankles, back" (06/22/2014)  . Asthma    "sports induced only"  . DDD (degenerative disc disease), lumbosacral   . Depression   . Diabetes mellitus, type 2 ([place])   . Hyperlipidemia   . Hypertension   . Hypothyroidism   . Localized osteoarthritis of left knee 06/27/2012  . Loose body of left knee 06/27/2012  . OSA (obstructive sleep apnea)    mild not on cpap  . Primary localized osteoarthritis of right knee 06/22/2014    Social History   Social History  . Marital status: Married    Spouse name: N/A  . Number of children: N/A  . Years of education: N/A   Occupational History  . Not on file.   Social History Main Topics  . Smoking status: Never Smoker  . Smokeless tobacco: Never Used  . Alcohol use Yes     Comment: very rarely, maybe 2 a yr..  . Drug use: No  . Sexual activity: Yes   Other Topics Concern  . Not on file   Social History Narrative   5 children  Married    Education officer, museum with work   Non smoker    H H of 7 pet dog    Past Surgical History:  Procedure Laterality Date  . HAND TENDON SURGERY Left ~ 2008 X 2  . JOINT REPLACEMENT    . KNEE ARTHROSCOPY Right 1981; 1993; ~ 2006  . KNEE ARTHROSCOPY  06/27/2012   Procedure: ARTHROSCOPY KNEE;  Surgeon: Johnny Bridge, MD;  Location: [facility];  Service: Orthopedics;  Laterality: Left;  left knee arthroscopy with removal loose foreign body , with debridement/shaving( chondroplasty)  . RHINOPLASTY  2000's  . TOTAL KNEE ARTHROPLASTY Right  06/22/2014  . TOTAL KNEE ARTHROPLASTY Right 06/22/2014   Procedure: RIGHT TOTAL KNEE ARTHROPLASTY;  Surgeon: Johnny Bridge, MD;  Location: [facility];  Service: Orthopedics;  Laterality: Right;    Family History  Problem Relation Age of Onset  . Colon polyps Mother   . Heart disease Father     32s   . Cancer Maternal Grandfather     colon  . Diabetes Paternal Grandmother   . Seizures Daughter   . Thyroid disease Daughter   . Aneurysm Father     AAA smoker  . ADD / ADHD Son   . Learning disabilities Son     Allergies  Allergen Reactions  . Horse-Derived Products     REACTION: UNKNOWN    Current Outpatient Prescriptions on File Prior to Visit  Medication Sig Dispense Refill  . amoxicillin-clavulanate (AUGMENTIN) 875-125 MG tablet Take 1 tablet by mouth 2 (two) times daily. 14 tablet 0  . aspirin 81 MG chewable tablet Chew 81 mg by mouth daily.      [name] atorvastatin (LIPITOR) 10 MG tablet Take 10 mg by mouth daily.      . benazepril (LOTENSIN) 10 MG tablet Take 10 mg  by mouth daily.      . Cholecalciferol (VITAMIN D) 2000 UNITS CAPS Take 2,000 Units by mouth daily.    [name] ezetimibe (ZETIA) 10 MG tablet Take 10 mg by mouth daily.      . fish oil-omega-3 fatty acids 1000 MG capsule Take 2 g by mouth daily.      . insulin glargine (LANTUS) 100 UNIT/ML injection Inject 40 Units into the skin every morning.     . Liraglutide (VICTOZA) 18 MG/3ML SOLN Inject 1.8 mg into the skin daily.     [name] loratadine-pseudoephedrine (CLARITIN-D 12-HOUR) 5-120 MG per tablet Take 1 tablet by mouth daily.    . meloxicam (MOBIC) 15 MG tablet TAKE 1 TABLET BY MOUTH EVERY DAY WITH FOOD AS NEEDED  1  . metFORMIN (GLUCOPHAGE) 500 MG tablet 2 qam and 3 qhs     . pioglitazone (ACTOS) 45 MG tablet Take 45 mg by mouth daily.     . sennosides-docusate sodium (SENOKOT-S) 8.6-50 MG tablet Take 2 tablets by mouth daily. 30 tablet 1  . SYNTHROID 75 MCG tablet TAKE 1 TABLET ON EMPTY STOMACH BEFORE BREAKFAST ONCE A DAY FOR  THYROID (DAW)  12   No current facility-administered medications on file prior to visit.     BP 110/80   Pulse 98   Temp 98.8 F (37.1 C) (Oral)   Wt 255 lb (115.7 kg)   SpO2 100%   BMI 36.59 kg/m chart    Objective:   Physical Exam  Constitutional: He is oriented to person, place, and time. He appears well-developed and well-nourished.  HENT:  Right Ear: External ear normal.  Left Ear: External ear normal.  Nose: Nose normal.  Mouth/Throat: Oropharynx is clear and moist.  Fluid noted to the right ear  Neck: Normal range of motion. Neck supple.  Cardiovascular: Normal rate, regular rhythm and normal heart sounds.   Pulmonary/Chest: Effort normal and breath sounds normal. He has no wheezes.  Musculoskeletal: Normal range of motion.  Neurological: He is alert and oriented to person, place, and time.  Skin: Skin is warm and dry.  Psychiatric: He has a normal mood and affect.       Assessment:     Ajene was seen today for cough.  Diagnoses and all orders for this visit:  Acute bronchitis, unspecified organism  Cough  Other orders -     doxycycline (VIBRA-TABS) 100 MG tablet; Take 1 tablet (100 mg total) by mouth 2 (two) times daily. -     albuterol (PROVENTIL HFA;VENTOLIN HFA) 108 (90 Base) MCG/ACT inhaler; Inhale 2 puffs into the lungs every 6 (six) hours as needed for wheezing or shortness of breath. -     benzonatate (TESSALON) 200 MG capsule; Take 1 capsule (200 mg total) by mouth 2 (two) times daily as needed for cough.      Plan:     Call the office with any questions or concerns. Recheck as needed.

## 2016-09-17 ENCOUNTER — Ambulatory Visit (INDEPENDENT_AMBULATORY_CARE_PROVIDER_SITE_OTHER): Payer: BLUE CROSS/BLUE SHIELD | Admitting: Internal Medicine

## 2016-09-17 ENCOUNTER — Encounter: Payer: Self-pay | Admitting: Internal Medicine

## 2016-09-17 VITALS — BP 104/60 | HR 79 | Temp 98.4°F | Wt 252.0 lb

## 2016-09-17 DIAGNOSIS — J0191 Acute recurrent sinusitis, unspecified: Secondary | ICD-10-CM | POA: Diagnosis not present

## 2016-09-17 DIAGNOSIS — Z794 Long term (current) use of insulin: Secondary | ICD-10-CM

## 2016-09-17 DIAGNOSIS — R053 Chronic cough: Secondary | ICD-10-CM

## 2016-09-17 DIAGNOSIS — R062 Wheezing: Secondary | ICD-10-CM | POA: Diagnosis not present

## 2016-09-17 DIAGNOSIS — R05 Cough: Secondary | ICD-10-CM

## 2016-09-17 DIAGNOSIS — J40 Bronchitis, not specified as acute or chronic: Secondary | ICD-10-CM

## 2016-09-17 DIAGNOSIS — E119 Type 2 diabetes mellitus without complications: Secondary | ICD-10-CM

## 2016-09-17 DIAGNOSIS — R0602 Shortness of breath: Secondary | ICD-10-CM | POA: Diagnosis not present

## 2016-09-17 MED ORDER — PREDNISONE 20 MG PO TABS: ORAL_TABLET | ORAL | 0 refills | Status: DC

## 2016-09-17 MED ORDER — ALBUTEROL SULFATE (2.5 MG/3ML) 0.083% IN NEBU
2.5000 mg | INHALATION_SOLUTION | Freq: Once | RESPIRATORY_TRACT | Status: AC
  Administered 2016-09-17: 2.5 mg via RESPIRATORY_TRACT

## 2016-09-17 MED ORDER — AMOXICILLIN-POT CLAVULANATE 875-125 MG PO TABS: 1.0000 | ORAL_TABLET | Freq: Two times a day (BID) | ORAL | 0 refills | Status: DC

## 2016-09-17 NOTE — Progress Notes (Signed)
Pre visit review using our clinic review tool, if applicable. No additional management support is needed unless otherwise documented below in the visit note.  Chief Complaint  Patient presents with  . Cough  . Shortness of Breath    HPI: Rickey Wright 56 y.o.  sda  appt for  Persistent cough  Last x ray 2017  Had a respiratory infection right after Christmas. Severe coughing. Seen  Jan for cough and rx for "bronchitis with antibiotic  An inhaler   Had severe cough at this time  And was improved but never really got better. and then coughing   Shortness of breath and " sinus  Stuff.  "   Continued cause  Short of breath .  DOE and ot restpinsive to albuterol  ? Not wheezing.   Can tell if different   . Not sure med got down in his chest.     No fever at this time.  Dry cough  prestty bad sinus issues    Most;ly thick  drainge face and pressure   A bout 10 days ago .   No hemoptysis fever chills Reiger's. No specific history of asthma. Does get recurrent sinusitis.  Dm gen under control   Below is UC note assessment   Rickey Wright was seen today for cough.  Diagnoses and all orders for this visit:  Acute bronchitis, unspecified organism  Cough  Other orders -     doxycycline (VIBRA-TABS) 100 MG tablet; Take 1 tablet (100 mg total) by mouth 2 (two) times daily. -     albuterol (PROVENTIL HFA;VENTOLIN HFA) 108 (90 Base) MCG/ACT inhaler; Inhale 2 puffs into the lungs every 6 (six) hours as needed for wheezing or shortness of breath. -     benzonatate (TESSALON) 200 MG capsule; Take 1 capsule (200 mg total) by mouth 2 (two) times daily as needed for cough.      Plan:     Call the office with any questions or concerns. Recheck as needed.         Patient Instructions by Kennyth Arnold, FNP at 08/19/2016 10:30 AM    ROS: See pertinent positives and negatives per HPI.  Past Medical History:  Diagnosis Date  . ADD (attention deficit disorder)   . Allergy   . Anemia    hx  iron deficiency   . Arthritis    "knees, shoulders, wrists, ankles, back" (06/22/2014)  . Asthma    "sports induced only"  . DDD (degenerative disc disease), lumbosacral   . Depression   . Diabetes mellitus, type 2 ([place])   . Hyperlipidemia   . Hypertension   . Hypothyroidism   . Localized osteoarthritis of left knee 06/27/2012  . Loose body of left knee 06/27/2012  . OSA (obstructive sleep apnea)    mild not on cpap  . Primary localized osteoarthritis of right knee 06/22/2014    Family History  Problem Relation Age of Onset  . Colon polyps Mother   . Heart disease Father     10s   . Cancer Maternal Grandfather     colon  . Diabetes Paternal Grandmother   . Seizures Daughter   . Thyroid disease Daughter   . Aneurysm Father     AAA smoker  . ADD / ADHD Son   . Learning disabilities Son     Social History   Social History  . Marital status: Married    Spouse name: N/A  . Number of children: N/A  .  Years of education: N/A   Social History Main Topics  . Smoking status: Never Smoker  . Smokeless tobacco: Never Used  . Alcohol use Yes     Comment: very rarely, maybe 2 a yr..  . Drug use: No  . Sexual activity: Yes   Other Topics Concern  . None   Social History Narrative   5 children  Married    Education officer, [employer] with work   Non smoker    H H of 7 pet dog    Outpatient Medications Prior to Visit  Medication Sig Dispense Refill  . albuterol (PROVENTIL HFA;VENTOLIN HFA) 108 (90 Base) MCG/ACT inhaler Inhale 2 puffs into the lungs every 6 (six) hours as needed for wheezing or shortness of breath. 1 Inhaler 2  . aspirin 81 MG chewable tablet Chew 81 mg by mouth daily.      [name] atorvastatin (LIPITOR) 10 MG tablet Take 10 mg by mouth daily.      . benazepril (LOTENSIN) 10 MG tablet Take 10 mg by mouth daily.      . Cholecalciferol (VITAMIN D) 2000 UNITS CAPS Take 2,000 Units by mouth daily.    [name] ezetimibe (ZETIA) 10 MG tablet Take 10 mg by mouth daily.      . fish  oil-omega-3 fatty acids 1000 MG capsule Take 2 g by mouth daily.      . insulin glargine (LANTUS) 100 UNIT/ML injection Inject 40 Units into the skin every morning.     . Liraglutide (VICTOZA) 18 MG/3ML SOLN Inject 1.8 mg into the skin daily.     [name] loratadine-pseudoephedrine (CLARITIN-D 12-HOUR) 5-120 MG per tablet Take 1 tablet by mouth daily.    . meloxicam (MOBIC) 15 MG tablet TAKE 1 TABLET BY MOUTH EVERY DAY WITH FOOD AS NEEDED  1  . metFORMIN (GLUCOPHAGE) 500 MG tablet 2 qam and 3 qhs     . pioglitazone (ACTOS) 45 MG tablet Take 45 mg by mouth daily.     [name] SYNTHROID 75 MCG tablet TAKE 1 TABLET ON EMPTY STOMACH BEFORE BREAKFAST ONCE A DAY FOR THYROID (DAW)  12  . amoxicillin-clavulanate (AUGMENTIN) 875-125 MG tablet Take 1 tablet by mouth 2 (two) times daily. 14 tablet 0  . benzonatate (TESSALON) 200 MG capsule Take 1 capsule (200 mg total) by mouth 2 (two) times daily as needed for cough. 20 capsule 0  . doxycycline (VIBRA-TABS) 100 MG tablet Take 1 tablet (100 mg total) by mouth 2 (two) times daily. 20 tablet 0  . sennosides-docusate sodium (SENOKOT-S) 8.6-50 MG tablet Take 2 tablets by mouth daily. 30 tablet 1   No facility-administered medications prior to visit.      EXAM:  BP 104/60 (BP Location: Right Arm, Patient Position: Sitting, Cuff Size: Large)   Pulse 79   Temp 98.4 F (36.9 C) (Oral)   Wt 252 lb (114.3 kg)   SpO2 97%   BMI 36.16 kg/m   Body mass index is 36.16 kg/m. WDWN in NAD  quiet respirations; mildly congested  somewhat hoarse. Non toxic .  Dry cough  HEENT: Normocephalic ;atraumatic , Eyes;  PERRL, EOMs  Full, lids and conjunctiva clear,,Ears: no deformities, canals nl, TM landmarks normal, Nose: no deformity or discharge but congested;face minimally tender  Bilateral Mouth : OP clear without lesion or edema .  White yellow pnd  Neck: Supple without adenopathy or masses or bruits Chest:   ? Dec bs r more than left with musical wheezing  Noted   No  restractinos  After neb  Diffuse musical wheezing  No rales  CV:  S1-S2 no gallops or murmurs peripheral perfusion is normal Skin :nl perfusion and no acute rashes   ASSESSMENT AND PLAN:  Discussed the following assessment and plan:  Cough, persistent - Plan: DG Chest 2 View, albuterol (PROVENTIL) (2.5 MG/3ML) 0.083% nebulizer solution 2.5 mg  Wheezy bronchitis - Plan: DG Chest 2 View, albuterol (PROVENTIL) (2.5 MG/3ML) 0.083% nebulizer solution 2.5 mg  Shortness of breath - Plan: DG Chest 2 View, albuterol (PROVENTIL) (2.5 MG/3ML) 0.083% nebulizer solution 2.5 mg  Acute recurrent sinusitis, unspecified location  Wheezing  Controlled type 2 diabetes mellitus without complication, with long-term current use of insulin (HCC) Treated in January empirically with antibiotics Tessalon Perles and an inhaler.  coul d have  Bacterial sinusitis  Prolonging  May get a c xray because    of the severity of sx    Although pulse ox if good  Limit cough med otx that can cause  Cns  Se  He has side effects of prednisone including vision changes and hard to control even with his diabetes medicine. May cut short to 3 days but I think it would help [place] his pulmonary symptoms. Low  threshold to get chest x-ray further evaluation.  -Patient advised to return or notify health care team  if symptoms worsen ,persist or new concerns arise.  Patient Instructions  Concern about bronchospasm Can rx sinustis  And wheezing    w antibiotic and prednisone    but  Because of shortness of breath  If getting worse   No improving  Get x ray ( [facility] office bldg)  Limit OTC meds that cause fogginess unless  He[ping sleep  Ok to cont the albuterol   Let me know how doing    In 2    -3 days or if worse .     Standley Brooking. Bob Eastwood M.D.  T

## 2016-09-17 NOTE — Patient Instructions (Addendum)
Concern about bronchospasm Can rx sinustis  And wheezing    w antibiotic and prednisone    but  Because of shortness of breath  If getting worse   No improving  Get x ray ( [facility] office bldg)  Limit OTC meds that cause fogginess unless  He[ping sleep  Ok to cont the albuterol   Let me know how doing    In 2    -3 days or if worse .

## 2016-09-18 ENCOUNTER — Telehealth: Payer: Self-pay | Admitting: Internal Medicine

## 2016-09-18 MED ORDER — ALBUTEROL SULFATE (2.5 MG/3ML) 0.083% IN NEBU: 2.5000 mg | INHALATION_SOLUTION | Freq: Four times a day (QID) | RESPIRATORY_TRACT | 0 refills | Status: DC | PRN

## 2016-09-18 NOTE — Telephone Encounter (Signed)
Spoke to the pt.  He has a nebulizer machine at home.  Per WP, ok to send in albuterol nebulizer solution to use every 6 hours as needed for SOB or wheezing.  #40 sent to the pharmacy.  Pt did not want to take the prednisone at this time.  Worried about blood sugar.  Will hold prednisone and use albuterol.

## 2016-09-18 NOTE — Telephone Encounter (Signed)
[facility] - Client [facility] Patient Name: Rickey Wright DOB: July 08, 1961 Initial Comment Caller states he is type 2 Diabetic. He was given rx for Prednisone. After having Nubulizer treatment in the office, he was able to sleep and breath better. He wants to know if he can do the treatments instead of taking Prednisone. Nurse Assessment Nurse: Dimas Chyle, RN, Dellis Filbert Date/Time Eilene Ghazi Time): 09/18/2016 10:09:32 AM Confirm and document reason for call. If symptomatic, describe symptoms. ---Caller states he is type 2 Diabetic. He was given rx for Prednisone. After having nebulizer treatment in the office, he was able to sleep and breath better. He wants to know if he can do the treatments instead of taking Prednisone. Seen in office yesterday. Wheezing. Does the patient have any new or worsening symptoms? ---Yes Will a triage be completed? ---Yes Related visit to physician within the last 2 weeks? ---Yes Does the PT have any chronic conditions? (i.e. diabetes, asthma, etc.) ---Yes List chronic conditions. ---Diabetes type 2 Is this a behavioral health or substance abuse call? ---No Guidelines Guideline Title Affirmed Question Affirmed Notes Final Disposition User Clinical Call Dimas Chyle, RN, Dellis Filbert Comments Caller has not started Rx yet and is concerned how it will affect his BS. Advised caller that I would send message to PCP about his concerns with new Rx and someone from the office would contact him.

## 2016-09-18 NOTE — Telephone Encounter (Signed)
Misty - can you please check on this as I am in lab all day. Thanks!

## 2016-09-18 NOTE — Telephone Encounter (Signed)
First use albuterol HFA  If  He thinks the neb is much better    MIsty can we get a temporary nebulizer to use albuterol q   Or duoneb q 6 hours  Until better

## 2016-09-19 NOTE — Telephone Encounter (Signed)
I agree as discussed

## 2016-09-27 ENCOUNTER — Other Ambulatory Visit: Payer: Self-pay | Admitting: Internal Medicine

## 2016-09-27 NOTE — Telephone Encounter (Signed)
Denied.  Message sent to the pharmacy to have the pt call if refill is needed/wanted.

## 2016-09-28 ENCOUNTER — Telehealth: Payer: Self-pay | Admitting: Internal Medicine

## 2016-09-28 MED ORDER — PREDNISONE 20 MG PO TABS: ORAL_TABLET | ORAL | 0 refills | Status: DC

## 2016-09-28 NOTE — Telephone Encounter (Signed)
Left a message for a return call.

## 2016-09-28 NOTE — Telephone Encounter (Signed)
Spoke to the pt.  He did decide to take the prednisone.  Continues to have sob and some difficulty breathing.  Does not feel like he can inhale fully. No fever or other symptoms (chest pain etc).  States inhaler and nebulizer do not work as well.  Using the inhaler every 6-8 hours.  Has not been using the nebulizer.  Been off prednisone for 2 days.  Still has a few more days left of the antibiotic.  Asking for a refill of prednisone.

## 2016-09-28 NOTE — Telephone Encounter (Signed)
Pt was seen on 09-17-16 and requesting misty to return his call concerning med

## 2016-09-28 NOTE — Telephone Encounter (Signed)
Spoke to the pt.  Advised that Dr. Regis Bill will refill prednisone.  Instructed him to get chest x-ray that was ordered at last visit.  Pt will go on Monday (10/01/16).  Advised Saturday clinic is available if needed.  Pt agreed.

## 2016-09-28 NOTE — Telephone Encounter (Signed)
Printed to discuss with WP. 

## 2016-10-01 ENCOUNTER — Ambulatory Visit (INDEPENDENT_AMBULATORY_CARE_PROVIDER_SITE_OTHER)
Admission: RE | Admit: 2016-10-01 | Discharge: 2016-10-01 | Disposition: A | Discharge status: Home / Self Care | Payer: BLUE CROSS/BLUE SHIELD | Source: Ambulatory Visit | Attending: Internal Medicine | Admitting: Internal Medicine

## 2016-10-01 DIAGNOSIS — R053 Chronic cough: Secondary | ICD-10-CM

## 2016-10-01 DIAGNOSIS — R05 Cough: Secondary | ICD-10-CM | POA: Diagnosis not present

## 2016-10-01 DIAGNOSIS — R0602 Shortness of breath: Secondary | ICD-10-CM | POA: Diagnosis not present

## 2016-10-01 DIAGNOSIS — J40 Bronchitis, not specified as acute or chronic: Secondary | ICD-10-CM

## 2017-04-26 ENCOUNTER — Encounter: Payer: Self-pay | Admitting: Internal Medicine

## 2017-10-09 DIAGNOSIS — E119 Type 2 diabetes mellitus without complications: Secondary | ICD-10-CM | POA: Diagnosis not present

## 2017-10-09 DIAGNOSIS — H2513 Age-related nuclear cataract, bilateral: Secondary | ICD-10-CM | POA: Diagnosis not present

## 2017-10-09 DIAGNOSIS — H01021 Squamous blepharitis right upper eyelid: Secondary | ICD-10-CM | POA: Diagnosis not present

## 2017-10-09 LAB — HM DIABETES EYE EXAM

## 2017-10-17 ENCOUNTER — Encounter: Payer: Self-pay | Admitting: Internal Medicine

## 2017-11-20 DIAGNOSIS — Z794 Long term (current) use of insulin: Secondary | ICD-10-CM | POA: Diagnosis not present

## 2017-11-20 DIAGNOSIS — E113293 Type 2 diabetes mellitus with mild nonproliferative diabetic retinopathy without macular edema, bilateral: Secondary | ICD-10-CM | POA: Diagnosis not present

## 2017-11-20 DIAGNOSIS — E039 Hypothyroidism, unspecified: Secondary | ICD-10-CM | POA: Diagnosis not present

## 2017-11-21 DIAGNOSIS — E1165 Type 2 diabetes mellitus with hyperglycemia: Secondary | ICD-10-CM | POA: Diagnosis not present

## 2017-11-21 DIAGNOSIS — E782 Mixed hyperlipidemia: Secondary | ICD-10-CM | POA: Diagnosis not present

## 2017-11-21 DIAGNOSIS — E113293 Type 2 diabetes mellitus with mild nonproliferative diabetic retinopathy without macular edema, bilateral: Secondary | ICD-10-CM | POA: Diagnosis not present

## 2018-01-26 DIAGNOSIS — J019 Acute sinusitis, unspecified: Secondary | ICD-10-CM | POA: Diagnosis not present

## 2018-07-19 ENCOUNTER — Other Ambulatory Visit: Payer: Self-pay

## 2018-07-19 ENCOUNTER — Emergency Department (HOSPITAL_COMMUNITY): Payer: 59

## 2018-07-19 ENCOUNTER — Observation Stay (HOSPITAL_COMMUNITY)
Admission: EM | Admit: 2018-07-19 | Discharge: 2018-07-20 | Disposition: A | Discharge status: Home / Self Care | Payer: 59 | Attending: Internal Medicine | Admitting: Internal Medicine

## 2018-07-19 ENCOUNTER — Encounter (HOSPITAL_COMMUNITY): Payer: Self-pay

## 2018-07-19 DIAGNOSIS — E785 Hyperlipidemia, unspecified: Secondary | ICD-10-CM | POA: Insufficient documentation

## 2018-07-19 DIAGNOSIS — G4733 Obstructive sleep apnea (adult) (pediatric): Secondary | ICD-10-CM | POA: Diagnosis not present

## 2018-07-19 DIAGNOSIS — E6609 Other obesity due to excess calories: Secondary | ICD-10-CM | POA: Diagnosis not present

## 2018-07-19 DIAGNOSIS — Z8249 Family history of ischemic heart disease and other diseases of the circulatory system: Secondary | ICD-10-CM | POA: Insufficient documentation

## 2018-07-19 DIAGNOSIS — E1165 Type 2 diabetes mellitus with hyperglycemia: Secondary | ICD-10-CM | POA: Insufficient documentation

## 2018-07-19 DIAGNOSIS — Z7982 Long term (current) use of aspirin: Secondary | ICD-10-CM | POA: Diagnosis not present

## 2018-07-19 DIAGNOSIS — I10 Essential (primary) hypertension: Secondary | ICD-10-CM | POA: Diagnosis not present

## 2018-07-19 DIAGNOSIS — Z791 Long term (current) use of non-steroidal anti-inflammatories (NSAID): Secondary | ICD-10-CM | POA: Diagnosis not present

## 2018-07-19 DIAGNOSIS — D509 Iron deficiency anemia, unspecified: Secondary | ICD-10-CM | POA: Diagnosis not present

## 2018-07-19 DIAGNOSIS — Z79890 Hormone replacement therapy (postmenopausal): Secondary | ICD-10-CM | POA: Diagnosis not present

## 2018-07-19 DIAGNOSIS — Z6835 Body mass index (BMI) 35.0-35.9, adult: Secondary | ICD-10-CM | POA: Insufficient documentation

## 2018-07-19 DIAGNOSIS — J45909 Unspecified asthma, uncomplicated: Secondary | ICD-10-CM | POA: Insufficient documentation

## 2018-07-19 DIAGNOSIS — Z9119 Patient's noncompliance with other medical treatment and regimen: Secondary | ICD-10-CM | POA: Diagnosis not present

## 2018-07-19 DIAGNOSIS — R079 Chest pain, unspecified: Secondary | ICD-10-CM | POA: Diagnosis not present

## 2018-07-19 DIAGNOSIS — E7800 Pure hypercholesterolemia, unspecified: Secondary | ICD-10-CM | POA: Diagnosis not present

## 2018-07-19 DIAGNOSIS — I249 Acute ischemic heart disease, unspecified: Secondary | ICD-10-CM | POA: Diagnosis not present

## 2018-07-19 DIAGNOSIS — E039 Hypothyroidism, unspecified: Secondary | ICD-10-CM | POA: Diagnosis present

## 2018-07-19 DIAGNOSIS — Z96651 Presence of right artificial knee joint: Secondary | ICD-10-CM | POA: Insufficient documentation

## 2018-07-19 DIAGNOSIS — R0609 Other forms of dyspnea: Secondary | ICD-10-CM | POA: Diagnosis not present

## 2018-07-19 DIAGNOSIS — Z79899 Other long term (current) drug therapy: Secondary | ICD-10-CM | POA: Insufficient documentation

## 2018-07-19 DIAGNOSIS — Z794 Long term (current) use of insulin: Secondary | ICD-10-CM

## 2018-07-19 DIAGNOSIS — E669 Obesity, unspecified: Secondary | ICD-10-CM | POA: Diagnosis present

## 2018-07-19 DIAGNOSIS — R0789 Other chest pain: Secondary | ICD-10-CM | POA: Diagnosis not present

## 2018-07-19 LAB — TROPONIN I
Troponin I: <0.03 ng/mL (ref <0.03)
Troponin I: <0.03 ng/mL (ref <0.03)

## 2018-07-19 LAB — POCT I-STAT TROPONIN I: Troponin i, poc: 0.01 ng/mL (ref 0.00–0.08)

## 2018-07-19 LAB — GLUCOSE, CAPILLARY
GLUCOSE-CAPILLARY: 200 mg/dL — AB (ref 70–99)
Glucose-Capillary: 222 mg/dL — ABNORMAL HIGH (ref 70–99)
Glucose-Capillary: 273 mg/dL — ABNORMAL HIGH (ref 70–99)
Glucose-Capillary: 210 mg/dL — ABNORMAL HIGH (ref 70–99)

## 2018-07-19 LAB — CBC
HCT: 48 % (ref 39.0–52.0)
HCT: 46.1 % (ref 39.0–52.0)
Hemoglobin: 15.2 g/dL (ref 13.0–17.0)
Hemoglobin: 14.5 g/dL (ref 13.0–17.0)
MCH: 27.6 pg (ref 26.0–34.0)
MCH: 26.8 pg (ref 26.0–34.0)
MCHC: 31.7 g/dL (ref 30.0–36.0)
MCHC: 31.5 g/dL (ref 30.0–36.0)
MCV: 87.1 fL (ref 80.0–100.0)
MCV: 85.1 fL (ref 80.0–100.0)
PLATELETS: 288 10*3/uL (ref 150–400)
Platelets: 279 10*3/uL (ref 150–400)
RBC: 5.51 MIL/uL (ref 4.22–5.81)
RBC: 5.42 MIL/uL (ref 4.22–5.81)
RDW: 13.5 % (ref 11.5–15.5)
RDW: 13.6 % (ref 11.5–15.5)
WBC: 10 10*3/uL (ref 4.0–10.5)
WBC: 8.9 10*3/uL (ref 4.0–10.5)
nRBC: 0 % (ref 0.0–0.2)
nRBC: 0 % (ref 0.0–0.2)

## 2018-07-19 LAB — BASIC METABOLIC PANEL
Anion gap: 12 (ref 5–15)
BUN: 18 mg/dL (ref 6–20)
CO2: 24 mmol/L (ref 22–32)
Calcium: 9.3 mg/dL (ref 8.9–10.3)
Chloride: 99 mmol/L (ref 98–111)
Creatinine, Ser: 0.98 mg/dL (ref 0.61–1.24)
GFR calc Af Amer: >60 mL/min (ref >60)
GFR calc non Af Amer: >60 mL/min (ref >60)
Glucose, Bld: 299 mg/dL — ABNORMAL HIGH (ref 70–99)
POTASSIUM: 4.6 mmol/L (ref 3.5–5.1)
Sodium: 135 mmol/L (ref 135–145)

## 2018-07-19 LAB — CREATININE, SERUM
Creatinine, Ser: 1 mg/dL (ref 0.61–1.24)
GFR calc Af Amer: >60 mL/min (ref >60)
GFR calc non Af Amer: >60 mL/min (ref >60)

## 2018-07-19 MED ORDER — EZETIMIBE 10 MG PO TABS
10.0000 mg | ORAL_TABLET | Freq: Every day | ORAL | Status: DC
  Administered 2018-07-20: 10 mg via ORAL
  Filled 2018-07-19: qty 1

## 2018-07-19 MED ORDER — ASPIRIN 81 MG PO CHEW
324.0000 mg | CHEWABLE_TABLET | Freq: Once | ORAL | Status: AC
  Administered 2018-07-19: 324 mg via ORAL
  Filled 2018-07-19: qty 4

## 2018-07-19 MED ORDER — ALBUTEROL SULFATE (2.5 MG/3ML) 0.083% IN NEBU: 2.5000 mg | INHALATION_SOLUTION | Freq: Four times a day (QID) | RESPIRATORY_TRACT | Status: DC | PRN

## 2018-07-19 MED ORDER — LIRAGLUTIDE 18 MG/3ML ~~LOC~~ SOPN
1.8000 mg | PEN_INJECTOR | Freq: Every day | SUBCUTANEOUS | Status: DC
  Filled 2018-07-19: qty 3

## 2018-07-19 MED ORDER — ONDANSETRON HCL 4 MG/2ML IJ SOLN: 4.0000 mg | Freq: Four times a day (QID) | INTRAMUSCULAR | Status: DC | PRN

## 2018-07-19 MED ORDER — LEVOTHYROXINE SODIUM 75 MCG PO TABS: 75.0000 ug | ORAL_TABLET | Freq: Every day | ORAL | Status: DC

## 2018-07-19 MED ORDER — PIOGLITAZONE HCL 45 MG PO TABS
45.0000 mg | ORAL_TABLET | Freq: Every day | ORAL | Status: DC
  Administered 2018-07-20: 45 mg via ORAL
  Filled 2018-07-19: qty 1

## 2018-07-19 MED ORDER — ACETAMINOPHEN 325 MG PO TABS: 650.0000 mg | ORAL_TABLET | ORAL | Status: DC | PRN

## 2018-07-19 MED ORDER — LEVOTHYROXINE SODIUM 75 MCG PO TABS
75.0000 ug | ORAL_TABLET | Freq: Every day | ORAL | Status: DC
  Administered 2018-07-20: 75 ug via ORAL
  Filled 2018-07-19: qty 1

## 2018-07-19 MED ORDER — INSULIN GLARGINE 100 UNIT/ML ~~LOC~~ SOLN
44.0000 [IU] | SUBCUTANEOUS | Status: DC
  Administered 2018-07-20: 44 [IU] via SUBCUTANEOUS
  Filled 2018-07-19: qty 0.44

## 2018-07-19 MED ORDER — ENOXAPARIN SODIUM 60 MG/0.6ML ~~LOC~~ SOLN
0.5000 mg/kg | SUBCUTANEOUS | Status: DC
  Administered 2018-07-19 – 2018-07-20 (×2): 55 mg via SUBCUTANEOUS
  Filled 2018-07-19 (×2): qty 0.6

## 2018-07-19 MED ORDER — BENAZEPRIL HCL 10 MG PO TABS
10.0000 mg | ORAL_TABLET | Freq: Every day | ORAL | Status: DC
  Administered 2018-07-20: 10 mg via ORAL
  Filled 2018-07-19: qty 1

## 2018-07-19 MED ORDER — ASPIRIN EC 81 MG PO TBEC
81.0000 mg | DELAYED_RELEASE_TABLET | Freq: Every day | ORAL | Status: DC
  Administered 2018-07-20: 81 mg via ORAL
  Filled 2018-07-19: qty 1

## 2018-07-19 MED ORDER — ALBUTEROL SULFATE HFA 108 (90 BASE) MCG/ACT IN AERS: 2.0000 | INHALATION_SPRAY | Freq: Four times a day (QID) | RESPIRATORY_TRACT | Status: DC | PRN

## 2018-07-19 MED ORDER — ATORVASTATIN CALCIUM 10 MG PO TABS
10.0000 mg | ORAL_TABLET | Freq: Every day | ORAL | Status: DC
  Administered 2018-07-20: 10 mg via ORAL
  Filled 2018-07-19: qty 1

## 2018-07-19 MED ORDER — ASPIRIN 81 MG PO CHEW: 81.0000 mg | CHEWABLE_TABLET | Freq: Every day | ORAL | Status: DC

## 2018-07-19 MED ORDER — PSEUDOEPHEDRINE HCL ER 120 MG PO TB12
120.0000 mg | ORAL_TABLET | Freq: Every day | ORAL | Status: DC
  Filled 2018-07-19: qty 1

## 2018-07-19 MED ORDER — INSULIN ASPART 100 UNIT/ML ~~LOC~~ SOLN
0.0000 [IU] | Freq: Three times a day (TID) | SUBCUTANEOUS | Status: DC
  Administered 2018-07-19 – 2018-07-20 (×2): 5 [IU] via SUBCUTANEOUS
  Administered 2018-07-20: 8 [IU] via SUBCUTANEOUS

## 2018-07-19 MED ORDER — LORATADINE-PSEUDOEPHEDRINE ER 5-120 MG PO TB12: 1.0000 | ORAL_TABLET | Freq: Every day | ORAL | Status: DC

## 2018-07-19 MED ORDER — INSULIN ASPART 100 UNIT/ML ~~LOC~~ SOLN: 0.0000 [IU] | Freq: Every day | SUBCUTANEOUS | Status: DC

## 2018-07-19 MED ORDER — LORATADINE 10 MG PO TABS
5.0000 mg | ORAL_TABLET | Freq: Every day | ORAL | Status: DC
  Filled 2018-07-19: qty 1

## 2018-07-19 NOTE — ED Notes (Signed)
I have just given report to Caryl Pina, RN on [place]. Will transport shortly.

## 2018-07-19 NOTE — ED Notes (Signed)
ED TO INPATIENT HANDOFF REPORT  Name/Age/Gender Rickey Wright 57 y.o. male  Code Status Code Status History    Date Active Date Inactive Code Status Order ID Comments User Context   06/22/2014 1400 06/24/2014 1724 Full Code [id number]  Johnny Bridge, MD Inpatient      Home/SNF/Other Home  Chief Complaint Chest Pain x Mult Days  Level of Care/Admitting Diagnosis ED Disposition    ED Disposition Condition Comment   Admit  Hospital Area: [facility] [[id number]]  Level of Care: Telemetry [5]  Admit to tele based on following criteria: Monitor for Ischemic changes  Diagnosis: Chest pain at rest [035465]  Admitting Physician: Louellen Molder [phone]  Attending Physician: Louellen Molder [[id number]]  PT Class (Do Not Modify): Observation [104]  PT Acc Code (Do Not Modify): Observation [10022]       Medical History Past Medical History:  Diagnosis Date  . ADD (attention deficit disorder)   . Allergy   . Anemia    hx iron deficiency   . Arthritis    "knees, shoulders, wrists, ankles, back" (06/22/2014)  . Asthma    "sports induced only"  . DDD (degenerative disc disease), lumbosacral   . Depression   . Diabetes mellitus, type 2 ([place])   . Hyperlipidemia   . Hypertension   . Hypothyroidism   . Localized osteoarthritis of left knee 06/27/2012  . Loose body of left knee 06/27/2012  . OSA (obstructive sleep apnea)    mild not on cpap  . Primary localized osteoarthritis of right knee 06/22/2014    Allergies Allergies  Allergen Reactions  . Horse-Derived Products Other (See Comments)    Unknown    IV Location/Drains/Wounds Patient Lines/Drains/Airways Status   Active Line/Drains/Airways    Name:   Placement date:   Placement time:   Site:   Days:   Peripheral IV 07/19/18 Left;Proximal;Posterior Forearm   07/19/18    0913    Forearm   less than 1          Labs/Imaging Results for orders placed or performed during the hospital encounter of  07/19/18 (from the past 48 hour(s))  Basic metabolic panel     Status: Abnormal   Collection Time: 07/19/18  9:15 AM  Result Value Ref Range   Sodium 135 135 - 145 mmol/L   Potassium 4.6 3.5 - 5.1 mmol/L   Chloride 99 98 - 111 mmol/L   CO2 24 22 - 32 mmol/L   Glucose, Bld 299 (H) 70 - 99 mg/dL   BUN 18 6 - 20 mg/dL   Creatinine, Ser 0.98 0.61 - 1.24 mg/dL   Calcium 9.3 8.9 - 10.3 mg/dL   GFR calc non Af Amer >60 >60 mL/min   GFR calc Af Amer >60 >60 mL/min   Anion gap 12 5 - 15    Comment: Performed at [facility], [place] [address], [place], [place] [address]  CBC     Status: None   Collection Time: 07/19/18  9:15 AM  Result Value Ref Range   WBC 10.0 4.0 - 10.5 K/uL   RBC 5.51 4.22 - 5.81 MIL/uL   Hemoglobin 15.2 13.0 - 17.0 g/dL   HCT 48.0 39.0 - 52.0 %   MCV 87.1 80.0 - 100.0 fL   MCH 27.6 26.0 - 34.0 pg   MCHC 31.7 30.0 - 36.0 g/dL   RDW 13.5 11.5 - 15.5 %   Platelets 288 150 - 400 K/uL   nRBC 0.0 0.0 -  0.2 %    Comment: Performed at [facility], [place] [address] [place], [place] [address]  POCT i-Stat troponin I     Status: None   Collection Time: 07/19/18  9:24 AM  Result Value Ref Range   Troponin i, poc 0.01 0.00 - 0.08 ng/mL   Comment 3            Comment: Due to the release kinetics of cTnI, a negative result within the first hours of the onset of symptoms does not rule out myocardial infarction with certainty. If myocardial infarction is still suspected, repeat the test at appropriate intervals.    Dg Chest 2 View  Result Date: 07/19/2018 CLINICAL DATA:  Chest pain. EXAM: CHEST - 2 VIEW COMPARISON:  Radiographs of October 01, 2016. FINDINGS: The heart size and mediastinal contours are within normal limits. Both lungs are clear. No pneumothorax or pleural effusion is noted. Stable elevated right hemidiaphragm is noted. The visualized skeletal structures are unremarkable. IMPRESSION: No active cardiopulmonary disease.  Electronically Signed   By: Marijo Conception, M.D.   On: 07/19/2018 08:45   EKG Interpretation  Date/Time:  Saturday July 19 2018 07:50:36 EST Ventricular Rate:  79 PR Interval:    QRS Duration: 80 QT Interval:  391 QTC Calculation: 449 R Axis:   35 Text Interpretation:  Sinus rhythm Abnormal R-wave progression, early transition Confirmed by Pattricia Boss [phone]) on 07/19/2018 8:47:20 AM Also confirmed by Pattricia Boss [phone]), editor [place]der Parents [phone])  on 07/19/2018 11:24:11 AM   Pending Labs Unresulted Labs (From admission, onward)   None      Vitals/Pain Today's Vitals   07/19/18 0752 07/19/18 0753 07/19/18 0803  BP: (!) 147/84    Pulse: 83    Resp: 18    Temp: 98.8 F (37.1 C)    TempSrc: Oral    SpO2: 96%  99%  Weight:  111.1 kg   Height:   5\' 10"  (1.778 m)  PainSc:   0-No pain    Isolation Precautions No active isolations  Medications Medications  aspirin chewable tablet 324 mg (324 mg Oral Given 07/19/18 0925)    Mobility walks

## 2018-07-19 NOTE — ED Notes (Signed)
As I write this, Dr. Jeanell Sparrow is examining him.

## 2018-07-19 NOTE — ED Triage Notes (Signed)
Patient c/o intermittent left chest pain x 2 weeks,but in the past week episodes have been more frequent. Patient states he had to take "Tums", but denies SOB or nausea.
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
## 2018-07-19 NOTE — H&P (Addendum)
[facility] H&P   Patient Demographics:    Austan Nicholl, is a 57 y.o. male  MRN: [id number]   DOB - 1961/05/19  Admit Date - 07/19/2018  Outpatient Primary MD for the patient is Panosh, Standley Brooking, MD  Referring MD: Dr. Jeanell Sparrow  Outpatient Specialists: None  Patient coming from: Home  Chief Complaint  Patient presents with  . Chest Pain      HPI:    Perley Arthurs  is a 57 y.o. male, with history of hypertension, diabetes mellitus, uncontrolled on insulin, obesity, family history of heart disease (father had MI in his 58s), ??obstructive sleep apnea not on CPAP presented to the ED with left-sided chest tightness for almost 3 weeks off and on.  Patient reports that he was having a chest tightness symptoms lasting for few minutes and would subside on its own.  No aggravating or relieving factors, no radiation of the pain, no trauma to the chest or lifting heavy weights.  Denies any sick contact or recent travel.  Denies any headache, blurred vision, dizziness, palpitations, abdominal pain, nausea, vomiting, fevers, chills, dysuria, diarrhea, weakness or numbness of his extremities.  He does report for the same duration he has been feeling short of breath on climbing stairs and having to hold onto the rails.  At baseline he is fairly active and able to walk a good distance on a flat surface.  Denies any change in weight or appetite.  Course in the ED vitals were stable except for mildly elevated systolic blood pressure in 150s.  Blood work showed WBC of 10 K, normal hemoglobin and platelets, normal electrolytes.  Blood glucose of 299.  EKG showed normal sinus rhythm at 79 with coronary progression, initial troponin was negative.  Patient given 324 mg aspirin in the ED and hospitalist consulted for observation to telemetry for chest pain rule out.     Review of systems:    In addition to the HPI  above, No Fever-chills, No Headache, No changes with Vision or hearing, No problems swallowing food or Liquids, Chest pain+++, dyspnea on exertion especially while climbing stairs, no cough No Abdominal pain, No Nausea or Vommitting, Bowel movements are regular, No Blood in stool or Urine, No dysuria, No new skin rashes or bruises, No new joints pains-aches,  No new weakness, tingling, numbness in any extremity, No recent weight gain or loss, No polyuria, polydypsia or polyphagia, No significant Mental Stressors.  A full 10 point Review of Systems was done, except as stated above, all other Review of Systems were negative.   With Past History of the following :    Past Medical History:  Diagnosis Date  . ADD (attention deficit disorder)   . Allergy   . Anemia    hx iron deficiency   . Arthritis    "knees, shoulders, wrists, ankles, back" (06/22/2014)  . Asthma    "  sports induced only"  . DDD (degenerative disc disease), lumbosacral   . Depression   . Diabetes mellitus, type 2 ([place])   . Hyperlipidemia   . Hypertension   . Hypothyroidism   . Localized osteoarthritis of left knee 06/27/2012  . Loose body of left knee 06/27/2012  . OSA (obstructive sleep apnea)    mild not on cpap  . Primary localized osteoarthritis of right knee 06/22/2014      Past Surgical History:  Procedure Laterality Date  . HAND TENDON SURGERY Left ~ 2008 X 2  . JOINT REPLACEMENT    . KNEE ARTHROSCOPY Right 1981; 1993; ~ 2006  . KNEE ARTHROSCOPY  06/27/2012   Procedure: ARTHROSCOPY KNEE;  Surgeon: Johnny Bridge, MD;  Location: [facility];  Service: Orthopedics;  Laterality: Left;  left knee arthroscopy with removal loose foreign body , with debridement/shaving( chondroplasty)  . RHINOPLASTY  2000's  . TOTAL KNEE ARTHROPLASTY Right 06/22/2014  . TOTAL KNEE ARTHROPLASTY Right 06/22/2014   Procedure: RIGHT TOTAL KNEE ARTHROPLASTY;  Surgeon: Johnny Bridge, MD;  Location: [facility];   Service: Orthopedics;  Laterality: Right;      Social History:     Social History   Tobacco Use  . Smoking status: Never Smoker  . Smokeless tobacco: Never Used  Substance Use Topics  . Alcohol use: Yes    Comment: very rarely, maybe 2 a yr..     Lives -home with family  Mobility -independent     Family History :     Family History  Problem Relation Age of Onset  . Colon polyps Mother   . Heart disease Father        65s   . Aneurysm Father        AAA smoker  . Seizures Daughter   . Thyroid disease Daughter   . Cancer Maternal Grandfather        colon  . Diabetes Paternal Grandmother   . ADD / ADHD Son   . Learning disabilities Son       Home Medications:   Prior to Admission medications   Medication Sig Start Date End Date Taking? Authorizing Provider  albuterol (PROVENTIL HFA;VENTOLIN HFA) 108 (90 Base) MCG/ACT inhaler Inhale 2 puffs into the lungs every 6 (six) hours as needed for wheezing or shortness of breath. 08/19/16  Yes Dutch Quint B, FNP  albuterol (PROVENTIL) (2.5 MG/3ML) 0.083% nebulizer solution Take 3 mLs (2.5 mg total) by nebulization every 6 (six) hours as needed for wheezing or shortness of breath. 09/18/16  Yes Panosh, Standley Brooking, MD  aspirin EC 81 MG tablet Take 81 mg by mouth daily.   Yes [provider]  atorvastatin (LIPITOR) 10 MG tablet Take 10 mg by mouth daily.     Yes [provider]  benazepril (LOTENSIN) 10 MG tablet Take 10 mg by mouth daily.     Yes [provider]  ezetimibe (ZETIA) 10 MG tablet Take 10 mg by mouth daily.     Yes [provider]  insulin glargine (LANTUS) 100 UNIT/ML injection Inject 44 Units into the skin every morning.    Yes Altheimer, Legrand Como, MD  Liraglutide (VICTOZA) 18 MG/3ML SOLN Inject 1.8 mg into the skin daily.    Yes Altheimer, Legrand Como, MD  loratadine-pseudoephedrine (CLARITIN-D 12-HOUR) 5-120 MG per tablet Take 1 tablet by mouth daily.   Yes [provider]    metFORMIN (GLUCOPHAGE) 500 MG tablet Take 1,000-1,500 mg by mouth See admin instructions. Take  1000 mg by mouth in the morning and 1500 mg by mouth at bedtime   Yes Altheimer, Legrand Como, MD  naproxen sodium (ALEVE) 220 MG tablet Take 220 mg by mouth daily.   Yes [provider]  pioglitazone (ACTOS) 45 MG tablet Take 45 mg by mouth daily.    Yes Altheimer, Legrand Como, MD  SYNTHROID 75 MCG tablet Take 75 mcg by mouth daily before breakfast.  11/11/15  Yes [provider]  amoxicillin-clavulanate (AUGMENTIN) 875-125 MG tablet Take 1 tablet by mouth every 12 (twelve) hours. Patient not taking: Reported on 07/19/2018 09/17/16   Panosh, Standley Brooking, MD  predniSONE (DELTASONE) 20 MG tablet Take 3 po qd for 2 days then 2 po qd for 3 days,or as directed Patient not taking: Reported on 07/19/2018 09/28/16   Panosh, Standley Brooking, MD     Allergies:     Allergies  Allergen Reactions  . Horse-Derived Products Other (See Comments)    Unknown     Physical Exam:   Vitals  Blood pressure (!) 154/82, pulse 79, temperature 98.1 F (36.7 C), temperature source Oral, resp. rate 16, height 5\' 10"  (1.778 m), weight 113.6 kg, SpO2 97 %.   General: Middle-aged obese male lying in bed in no acute distress HEENT: Pupils reactive bilaterally, EOMI, no pallor, no icterus, moist mucosa, supple neck Chest: Clear to auscultation bilaterally, no reproducible pain on pressure over the left chest, no deformity CVs: Normal S1-S2, no murmurs rub or gallop GI: Soft, nondistended, nontender, bowel sounds present Musculoskeletal: Warm, no edema CNS: Alert and oriented, nonfocal   Data Review:    CBC Recent Labs  Lab 07/19/18 0915  WBC 10.0  HGB 15.2  HCT 48.0  PLT 288  MCV 87.1  MCH 27.6  MCHC 31.7  RDW 13.5   ------------------------------------------------------------------------------------------------------------------  Chemistries  Recent Labs  Lab 07/19/18 0915  NA 135  K 4.6  CL 99  CO2  24  GLUCOSE 299*  BUN 18  CREATININE 0.98  CALCIUM 9.3   ------------------------------------------------------------------------------------------------------------------ estimated creatinine clearance is 104.9 mL/min (by C-G formula based on SCr of 0.98 mg/dL). ------------------------------------------------------------------------------------------------------------------ No results for input(s): TSH, T4TOTAL, T3FREE, THYROIDAB in the last 72 hours.  Invalid input(s): FREET3  Coagulation profile No results for input(s): INR, PROTIME in the last 168 hours. ------------------------------------------------------------------------------------------------------------------- No results for input(s): DDIMER in the last 72 hours. -------------------------------------------------------------------------------------------------------------------  Cardiac Enzymes No results for input(s): CKMB, TROPONINI, MYOGLOBIN in the last 168 hours.  Invalid input(s): CK ------------------------------------------------------------------------------------------------------------------ No results found for: BNP   ---------------------------------------------------------------------------------------------------------------  Urinalysis    Component Value Date/Time   BILIRUBINUR n 08/30/2011 1227   PROTEINUR n 08/30/2011 1227   UROBILINOGEN 0.2 08/30/2011 1227   NITRITE n 08/30/2011 1227   LEUKOCYTESUR Negative 08/30/2011 1227    ----------------------------------------------------------------------------------------------------------------   Imaging Results:    Dg Chest 2 View  Result Date: 07/19/2018 CLINICAL DATA:  Chest pain. EXAM: CHEST - 2 VIEW COMPARISON:  Radiographs of October 01, 2016. FINDINGS: The heart size and mediastinal contours are within normal limits. Both lungs are clear. No pneumothorax or pleural effusion is noted. Stable elevated right hemidiaphragm is noted. The  visualized skeletal structures are unremarkable. IMPRESSION: No active cardiopulmonary disease. Electronically Signed   By: Marijo Conception, M.D.   On: 07/19/2018 08:45    My personal review of EKG: Normal sinus rhythm at 79 with poor R wave progression, no new changes  Assessment & Plan:   Visible problem Chest pain at rest Has both typical and  atypical symptoms.  Heart score of 4.  Initial troponin and EKG normal.  Has several comorbidities including hypertension, obesity, uncontrolled diabetes mellitus and strong family history. Cycle serial troponins and EKG.  2D echo to evaluate LVEF and wall motion abnormality. On baby aspirin at home which is resumed. S/l nitro prn. On low-dose Lipitor at home which is continued. D/w cardiology Dr Cathie Olden who will schedule for inpt stress test in am.NPO after midnight.   Active Problems: Essential hypertension Stable.  Resume lisinopril.    Obstructive sleep apnea Resume nighttime CPAP   Obesity (BMI 35.93 kg/m) Counseled on diet and exercise to lose weight      Uncontrolled type 2 diabetes mellitus with hyperglycemia, with long-term current use of insulin (HCC) Resume home dose Lantus, Victoza and Actos..  Hold metformin.  Reports A1c to be around 8.5.  Has not been checking his blood sugar regularly in the past 2 weeks.     Hypothyroidism Continue Synthroid.    DVT Prophylaxis: Subcu Lovenox  AM Labs Ordered, also please review Full Orders  Family Communication: Admission, patients condition and plan of care including tests being ordered have been discussed with the patient at bedside  Code Status full code  Likely DC to home pending work-up, possibly in 24 hours  Condition: Fair  Consults called: Cardiology  Admission status: Observation,   Time spent in minutes : 45   Wyatt Thorstenson M.D on 07/19/2018 at 12:43 PM  Between 7am to 7pm - Pager - [phone]. After 7pm go to [email or web address] - password [facility]  [facility] - Office  [phone]

## 2018-07-20 ENCOUNTER — Observation Stay (HOSPITAL_BASED_OUTPATIENT_CLINIC_OR_DEPARTMENT_OTHER): Payer: 59

## 2018-07-20 ENCOUNTER — Ambulatory Visit (HOSPITAL_BASED_OUTPATIENT_CLINIC_OR_DEPARTMENT_OTHER)
Admit: 2018-07-20 | Discharge: 2018-07-20 | Disposition: A | Discharge status: Home / Self Care | Payer: 59 | Source: Ambulatory Visit | Attending: Cardiovascular Disease | Admitting: Cardiovascular Disease

## 2018-07-20 DIAGNOSIS — E039 Hypothyroidism, unspecified: Secondary | ICD-10-CM

## 2018-07-20 DIAGNOSIS — R0609 Other forms of dyspnea: Secondary | ICD-10-CM | POA: Diagnosis not present

## 2018-07-20 DIAGNOSIS — G4733 Obstructive sleep apnea (adult) (pediatric): Secondary | ICD-10-CM | POA: Diagnosis not present

## 2018-07-20 DIAGNOSIS — R0602 Shortness of breath: Secondary | ICD-10-CM | POA: Diagnosis not present

## 2018-07-20 DIAGNOSIS — I249 Acute ischemic heart disease, unspecified: Secondary | ICD-10-CM

## 2018-07-20 DIAGNOSIS — D509 Iron deficiency anemia, unspecified: Secondary | ICD-10-CM | POA: Diagnosis not present

## 2018-07-20 DIAGNOSIS — R079 Chest pain, unspecified: Secondary | ICD-10-CM | POA: Diagnosis not present

## 2018-07-20 DIAGNOSIS — R072 Precordial pain: Secondary | ICD-10-CM

## 2018-07-20 DIAGNOSIS — I10 Essential (primary) hypertension: Secondary | ICD-10-CM

## 2018-07-20 DIAGNOSIS — E1165 Type 2 diabetes mellitus with hyperglycemia: Secondary | ICD-10-CM

## 2018-07-20 DIAGNOSIS — R0789 Other chest pain: Secondary | ICD-10-CM | POA: Diagnosis not present

## 2018-07-20 DIAGNOSIS — Z794 Long term (current) use of insulin: Secondary | ICD-10-CM

## 2018-07-20 LAB — GLUCOSE, CAPILLARY
Glucose-Capillary: 267 mg/dL — ABNORMAL HIGH (ref 70–99)
Glucose-Capillary: 240 mg/dL — ABNORMAL HIGH (ref 70–99)

## 2018-07-20 LAB — NM MYOCAR MULTI W/SPECT W/WALL MOTION / EF
CHL CUP MPHR: 163 {beats}/min
Estimated workload: 1 METS
Exercise duration (min): 5 min
Exercise duration (sec): 17 s
Peak HR: 101 {beats}/min
Percent HR: 61 %
Rest HR: 73 {beats}/min

## 2018-07-20 LAB — ECHOCARDIOGRAM COMPLETE
Height: 70 in
Weight: 4007.08 oz

## 2018-07-20 LAB — TROPONIN I: Troponin I: <0.03 ng/mL (ref <0.03)

## 2018-07-20 LAB — HIV ANTIBODY (ROUTINE TESTING W REFLEX): HIV Screen 4th Generation wRfx: NONREACTIVE

## 2018-07-20 MED ORDER — REGADENOSON 0.4 MG/5ML IV SOLN
INTRAVENOUS | Status: AC
  Filled 2018-07-20: qty 5

## 2018-07-20 MED ORDER — TECHNETIUM TC 99M TETROFOSMIN IV KIT
10.0000 | PACK | Freq: Once | INTRAVENOUS | Status: AC | PRN
  Administered 2018-07-20: 10 via INTRAVENOUS

## 2018-07-20 MED ORDER — TECHNETIUM TC 99M TETROFOSMIN IV KIT
30.0000 | PACK | Freq: Once | INTRAVENOUS | Status: AC | PRN
  Administered 2018-07-20: 30 via INTRAVENOUS

## 2018-07-20 MED ORDER — REGADENOSON 0.4 MG/5ML IV SOLN
0.4000 mg | Freq: Once | INTRAVENOUS | Status: AC
  Administered 2018-07-20: 0.4 mg via INTRAVENOUS
  Filled 2018-07-20: qty 5

## 2018-07-20 NOTE — Progress Notes (Signed)
   Progress Note  Patient Name: Rickey Wright Date of Encounter: 07/20/2018  Chart reviewed, patient was discussed per hospitalist team with Dr. Acie Fredrickson yesterday regarding arranging an inpatient Myoview study for evaluation of chest pain.  All troponin I levels are negative.  Echocardiogram pending.  Formal consultation was not obtained, but can be reconsidered if his cardiac testing is significantly abnormal.  Signed, Rozann Lesches, MD  07/20/2018, 8:35 AM

## 2018-07-20 NOTE — Discharge Summary (Addendum)
Physician Discharge Summary  SERAFINO BURCIAGA GUY:[id number] DOB: 06/09/1961  PCP: Burnis Medin, MD  Admit date: 07/19/2018 Discharge date: 07/20/2018  Recommendations for Outpatient Follow-up:  1. Dr. Shanon Ace, PCP in 1 week. 2. Dr. Legrand Como Altheimer, Endocrinology  Home Health: None Equipment/Devices: None  Discharge Condition: Improved and stable CODE STATUS: Full Diet recommendation: Heart healthy & diabetic diet.  Discharge Diagnoses:  Active Problems:   OBESITY   ANEMIA, IRON DEFICIENCY   Essential hypertension   OSA (obstructive sleep apnea)   Chest pain at rest   Uncontrolled type 2 diabetes mellitus with hyperglycemia, with long-term current use of insulin (HCC)   Hypothyroidism   Brief Summary: 57 year old married male, PMH of type II DM/IDDM, HLD, HTN, hypothyroid, OSA intolerant of CPAP, asthma, arthritis/DDD, family history of heart disease (father reportedly had an MI in his 46s), presented to [facility] ED on 07/19/2018 with complaints of left-sided chest pain.  Patient and spouse provided history.  He indicates that his chest pain started approximately 3 weeks ago.  It is quite localized and he is able to point with a finger to his left upper anterior chest.  The chest pain at the beginning was once or twice a day, transient lasting couple of seconds and would resolve spontaneously, sharp in nature, nonradiating, no aggravating or relieving factors, not associated with dyspnea, palpitations, dizziness, nausea or sweating, no relationship to activity or rest.  This discomfort was unlike his usual heartburn.  During the couple days prior to hospital visit, the episode started becoming more frequent but not prolonged or changes in character as described above.  On careful questioning, he does recollect that his pain seemed to start after a fall he sustained about 3 weeks ago when he was at his parents place trying to help them move.  He slid on the stairs and  landed hard on his right elbow at which time he was try to prevent the fall by holding onto something rather hard with his left upper extremity.  His right elbow and upper arm were significantly bruised.  He does sedentary desk work.  He has been driving back and forth multiple times to [place] and [facility] but denies new onset asymmetric lower extremity pain or swelling.  He gives history of chronic right leg smaller than left after right knee surgery ~ 4 years or more.  He also has history of chronic lower extremity cramps without any recent changes.  Over the last 5 to 6 months, he reports dyspnea on exertion i.e. climbing 3 flights of stairs without chest pain and then stopped taking the stairs at work and started using the elevator.  This has not worsened and he states that he really does not do much physical work.  In the past he used to walk a few miles per week but has stopped doing that a year and a half ago and states that he just has not been active.  In the ED, vitals stable except for mildly elevated blood pressures, lab work significant for blood glucose of 299, EKG showed normal sinus rhythm with slow R wave progression.  Troponin was negative.  He received aspirin 324 mg in the ED and was admitted to the hospital for evaluation and management of chest pain.  Assessment and plan:  Atypical chest pain: EKG without acute changes.  Troponin cycled x3 and negative.  Patient has several comorbidities including HTN, DM 2, HLD, obesity and strong family cardiac history.  Heart score:  4.  Thereby admitting physician discussed with Cardiology on-call and arranged for nuclear stress test.  Nuclear stress test results as below/low risk study.  TTE shows normal EF and grade 2 diastolic dysfunction.  Chest x-ray without acute cardiopulmonary disease.  On physical exam, patient has clearly reproducible chest wall tenderness to finger press.  Suspect that his chest pain is likely musculoskeletal in  origin related to strain from fall approximately 3 weeks ago which coincides with the onset of chest pain.  Recommended supportive care with couple of days of NSAIDs with meals and monitor closely.  If pain does not resolve or worsens then seek immediate medical attention and then may consider further cardiac testing.  He and spouse verbalized understanding.  Dyspnea on exertion: Unclear etiology.  Longstanding history.  Low index of suspicion for pulmonary embolism.  Suspect multifactorial due to obesity, OSA, asthma and deconditioning.  Advised diet, exercise, weight loss and may consider outpatient pulmonology consultation including repeating sleep study.  No clinical bronchospasm.    OSA: Reportedly tested for sleep apnea about 14 years ago but patient could not tolerate CPAP.  As per spouse, he does not snore much and she has not noticed any apneic spells.  Patient states that he has not really gained much weight in the last 14 years, may be 4 to 5 pounds and he suspects that his dyspnea is because he does not do as much as he used to before.  It may not be unreasonable to repeat sleep study to reassess.  Asthma: Stable without clinical bronchospasm.  Type II DM/IDDM: Noncompliant with multiple aspects of DM care.  Has not checked CBGs at home in a long time.  Cannot recollect last A1c.  Has not been to see his endocrinologist in almost a year.  Last A1c on 11/20/2017 was 8.4 suggesting poor outpatient control.  Counseled extensively regarding importance of compliance with all aspects of DM care and recommend outpatient follow-up with his endocrinologist.  In the hospital patient was continued on all of his diabetic meds except metformin and was placed on SSI and his CBGs ranged in the 200s.  Hyperlipidemia: Continue home dose of statins.  Essential hypertension: Mildly uncontrolled.  Continue benazepril 10 mg daily.  Outpatient follow-up and adjust medications as needed.  Hyperlipidemia: Continue  atorvastatin and Zetia.  Hypothyroid: Clinically euthyroid.  Continue Synthroid.  No recent TSH/last was 1.58 in 2013.  Outpatient follow-up.  Morbid obesity/Body mass index is 35.93 kg/m.    Consultations:  None  Procedures:  TTE 07/20/2018: Study Conclusions  - Left ventricle: The cavity size was normal. Wall thickness was   normal. Systolic function was normal. The estimated ejection   fraction was in the range of 60% to 65%. Wall motion was normal;   there were no regional wall motion abnormalities. Features are   consistent with a pseudonormal left ventricular filling pattern,   with concomitant abnormal relaxation and increased filling   pressure (grade 2 diastolic dysfunction).    Discharge Instructions  Discharge Instructions    Activity as tolerated - No restrictions   Complete by:  As directed    Call MD for:  difficulty breathing, headache or visual disturbances   Complete by:  As directed    Call MD for:  extreme fatigue   Complete by:  As directed    Call MD for:  persistant dizziness or light-headedness   Complete by:  As directed    Call MD for:  severe uncontrolled pain  Complete by:  As directed    Diet - low sodium heart healthy   Complete by:  As directed    Diet Carb Modified   Complete by:  As directed        Medication List    STOP taking these medications   amoxicillin-clavulanate 875-125 MG tablet Commonly known as:  AUGMENTIN   predniSONE 20 MG tablet Commonly known as:  DELTASONE     TAKE these medications   albuterol 108 (90 Base) MCG/ACT inhaler Commonly known as:  PROVENTIL HFA;VENTOLIN HFA Inhale 2 puffs into the lungs every 6 (six) hours as needed for wheezing or shortness of breath.   albuterol (2.5 MG/3ML) 0.083% nebulizer solution Commonly known as:  PROVENTIL Take 3 mLs (2.5 mg total) by nebulization every 6 (six) hours as needed for wheezing or shortness of breath.   aspirin EC 81 MG tablet Take 81 mg by mouth  daily.   atorvastatin 10 MG tablet Commonly known as:  LIPITOR Take 10 mg by mouth daily.   benazepril 10 MG tablet Commonly known as:  LOTENSIN Take 10 mg by mouth daily.   ezetimibe 10 MG tablet Commonly known as:  ZETIA Take 10 mg by mouth daily.   LANTUS 100 UNIT/ML injection Generic drug:  insulin glargine Inject 44 Units into the skin every morning.   loratadine-pseudoephedrine 5-120 MG tablet Commonly known as:  CLARITIN-D 12-hour Take 1 tablet by mouth daily.   metFORMIN 500 MG tablet Commonly known as:  GLUCOPHAGE Take 1,000-1,500 mg by mouth See admin instructions. Take 1000 mg by mouth in the morning and 1500 mg by mouth at bedtime   naproxen sodium 220 MG tablet Commonly known as:  ALEVE Take 220 mg by mouth daily.   pioglitazone 45 MG tablet Commonly known as:  ACTOS Take 45 mg by mouth daily.   SYNTHROID 75 MCG tablet Generic drug:  levothyroxine Take 75 mcg by mouth daily before breakfast.   VICTOZA 18 MG/3ML Soln injection Generic drug:  Liraglutide Inject 1.8 mg into the skin daily.      Follow-up Information    Panosh, Standley Brooking, MD. Schedule an appointment as soon as possible for a visit in 1 week(s).   Specialties:  Internal Medicine, Pediatrics Contact information: [place] [address] [phone]        Altheimer, Legrand Como, MD. Schedule an appointment as soon as possible for a visit.   Specialty:  Endocrinology Contact information: [place] [address] [phone]          Allergies  Allergen Reactions  . Horse-Derived Products Other (See Comments)    Unknown      Procedures/Studies: Dg Chest 2 View  Result Date: 07/19/2018 CLINICAL DATA:  Chest pain. EXAM: CHEST - 2 VIEW COMPARISON:  Radiographs of October 01, 2016. FINDINGS: The heart size and mediastinal contours are within normal limits. Both lungs are clear. No pneumothorax or pleural effusion is noted. Stable elevated  right hemidiaphragm is noted. The visualized skeletal structures are unremarkable. IMPRESSION: No active cardiopulmonary disease. Electronically Signed   By: Marijo Conception, M.D.   On: 07/19/2018 08:45   Nm Myocar Multi W/spect W/wall Motion / Ef  Result Date: 07/20/2018  There was no ST segment deviation noted during stress.  This is a low risk study.  Nuclear stress EF: 54%. No wall motion abnormalities.  Overall low risk stress test with mild apical defect suggestive of breast attenuation artifact.  If pain becomes  more worrisome, consider further cardiac testing.  Candee Furbish, MD      Subjective: History as noted above.  No current chest pain or dyspnea.  Denies any other complaints.  Discharge Exam:  Vitals:   07/20/18 1027 07/20/18 1028 07/20/18 1030 07/20/18 1218  BP: (!) 165/89 (!) 161/84 (!) 141/81 139/73  Pulse:    81  Resp:    16  Temp:    98 F (36.7 C)  TempSrc:    Oral  SpO2:    97%  Weight:      Height:        General: Pleasant young male, moderately built and obese, sitting up comfortably in bed. Cardiovascular: S1 & S2 heard, RRR, S1/S2 +. No murmurs, rubs, gallops or clicks. No JVD or pedal edema.  Telemetry personally reviewed: Sinus rhythm. Respiratory: Clear to auscultation without wheezing, rhonchi or crackles. No increased work of breathing.  Reproducible point tenderness over left upper anterior chest. Abdominal:  Non distended, non tender & soft. No organomegaly or masses appreciated. Normal bowel sounds heard. CNS: Alert and oriented. No focal deficits. Extremities: Symmetric normal power.  Right TKR scar.  Right leg chronically mildly decreased bulk compared to left.  No other acute signs.    The results of significant diagnostics from this hospitalization (including imaging, microbiology, ancillary and laboratory) are listed below for reference.      Labs: CBC: Recent Labs  Lab 07/19/18 0915 07/19/18 1621  WBC 10.0 8.9  HGB 15.2 14.5   HCT 48.0 46.1  MCV 87.1 85.1  PLT 288 301   Basic Metabolic Panel: Recent Labs  Lab 07/19/18 0915 07/19/18 1621  NA 135  --   K 4.6  --   CL 99  --   CO2 24  --   GLUCOSE 299*  --   BUN 18  --   CREATININE 0.98 1.00  CALCIUM 9.3  --    Cardiac Enzymes: Recent Labs  Lab 07/19/18 1621 07/19/18 2129 07/20/18 0450  TROPONINI <0.03 <0.03 <0.03   CBG: Recent Labs  Lab 07/19/18 1413 07/19/18 1633 07/19/18 2024 07/20/18 0752 07/20/18 1141  GLUCAP 273* 210* 200* 267* 240*    I discussed in detail with patient's spouse at bedside, updated care and answered all questions.   Time coordinating discharge: 40 minutes  SIGNED:  Vernell Leep, MD, FACP, [facility]. [facility] Pager [phone]  If 7PM-7AM, please contact night-coverage [email or web address] Password [email or web address] 07/20/2018, 4:00 PM

## 2018-07-20 NOTE — Plan of Care (Signed)
Phone call with wife. Questions answered adequately.  Pt calm and cooperative during shift.

## 2018-07-20 NOTE — Progress Notes (Signed)
Lexiscan given. Patient in no distress.

## 2018-07-20 NOTE — Discharge Instructions (Signed)

## 2018-07-20 NOTE — Progress Notes (Signed)
  Echocardiogram 2D Echocardiogram has been performed.  Randa Lynn Charne Mcbrien 07/20/2018, 8:46 AM

## 2018-07-22 ENCOUNTER — Telehealth: Payer: Self-pay

## 2018-07-22 NOTE — Telephone Encounter (Signed)
Transition Care Management Follow-up Telephone Call   Date discharged? 07/20/18   How have you been since you were released from the hospital? "I've been fine"   Do you understand why you were in the hospital? yes   Do you understand the discharge instructions? yes   Where were you discharged to? Home   Items Reviewed:  Medications reviewed: yes  Allergies reviewed: yes  Dietary changes reviewed: yes  Referrals reviewed: yes    Functional Questionnaire:   Activities of Daily Living (ADLs):   He states they are independent in the following: ambulation, bathing and hygiene, feeding, continence, grooming, toileting and dressing States they require assistance with the following: None   Any transportation issues/concerns?: no   Any patient concerns? no   Confirmed importance and date/time of follow-up visits scheduled yes  Provider Appointment booked with Dr. Regis Bill on 08/12/17 due to patient being out of town  Confirmed with patient if condition begins to worsen call PCP or go to the ER.  Patient was given the office number and encouraged to call back with question or concerns.  : yes

## 2018-08-11 NOTE — Progress Notes (Signed)
Chief Complaint  Patient presents with  . Hospitalization Follow-up    pt still says that his muscles around his chest still hurts when he breathes and moves a certain way     HPI: Rickey Wright 58 y.o. come in for FU hosp for chest pain  12 14    I began soon  After tgiving.   Fall slipped down stars and contused r elbow and left shoulder was  Posteriorly extended then began to have local left upper pain and behind scapula  Wend to ed and had neg cv eval   Poss ms cause  Pain worse with certain motion.  Worse with laughing.   He had  myoview scan 12 15  1  Low risk  Breast attenuation.    Lab in ed revealed his dm was out of control  Had not managed it for almost a year but now motivated to get back on track  Beginning keto to jump start   Mediterranean Fasting 120.recently  Just started  Doing better  102 and 108   Vision is better already   No sob cough fever exercise limitation except knee s bp usually good  Last visit with me was  2 2018   Almost t 2 years ago  He is followed but endocrinology b ut ha been l;ax in FU  Has hx of allergy cough and adhd   ROS: See pertinent positives and negatives per HPI. No fever vomiting bleeding syncope   Past Medical History:  Diagnosis Date  . ADD (attention deficit disorder)   . Allergy   . Anemia    hx iron deficiency   . Arthritis    "knees, shoulders, wrists, ankles, back" (06/22/2014)  . Asthma    "sports induced only"  . DDD (degenerative disc disease), lumbosacral   . Depression   . Diabetes mellitus, type 2 ([place])   . Hyperlipidemia   . Hypertension   . Hypothyroidism   . Localized osteoarthritis of left knee 06/27/2012  . Loose body of left knee 06/27/2012  . OSA (obstructive sleep apnea)    mild not on cpap  . Primary localized osteoarthritis of right knee 06/22/2014    Family History  Problem Relation Age of Onset  . Colon polyps Mother   . Heart disease Father        90s   . Aneurysm Father        AAA smoker   . Seizures Daughter   . Thyroid disease Daughter   . Cancer Maternal Grandfather        colon  . Diabetes Paternal Grandmother   . ADD / ADHD Son   . Learning disabilities Son     Social History   Socioeconomic History  . Marital status: Married    Spouse name: Not on file  . Number of children: Not on file  . Years of education: Not on file  . Highest education level: Not on file  Occupational History  . Not on file  Social Needs  . Financial resource strain: Not on file  . Food insecurity:    Worry: Not on file    Inability: Not on file  . Transportation needs:    Medical: Not on file    Non-medical: Not on file  Tobacco Use  . Smoking status: Never Smoker  . Smokeless tobacco: Never Used  Substance and Sexual Activity  . Alcohol use: Yes    Comment: very rarely, maybe 2 a yr.[name]  [name]  Drug use: No  . Sexual activity: Yes  Lifestyle  . Physical activity:    Days per week: Not on file    Minutes per session: Not on file  . Stress: Not on file  Relationships  . Social connections:    Talks on phone: Not on file    Gets together: Not on file    Attends religious service: Not on file    Active member of club or organization: Not on file    Attends meetings of clubs or organizations: Not on file    Relationship status: Not on file  Other Topics Concern  . Not on file  Social History Narrative   5 children  Married    Education officer, museum with work   Non smoker    H H of 7 pet dog    Outpatient Medications Prior to Visit  Medication Sig Dispense Refill  . albuterol (PROVENTIL HFA;VENTOLIN HFA) 108 (90 Base) MCG/ACT inhaler Inhale 2 puffs into the lungs every 6 (six) hours as needed for wheezing or shortness of breath. 1 Inhaler 2  . albuterol (PROVENTIL) (2.5 MG/3ML) 0.083% nebulizer solution Take 3 mLs (2.5 mg total) by nebulization every 6 (six) hours as needed for wheezing or shortness of breath. 40 mL 0  . aspirin EC 81 MG tablet Take 81 mg by mouth daily.    [name]  atorvastatin (LIPITOR) 10 MG tablet Take 10 mg by mouth daily.      . benazepril (LOTENSIN) 10 MG tablet Take 10 mg by mouth daily.      [name] ezetimibe (ZETIA) 10 MG tablet Take 10 mg by mouth daily.      . Insulin Glargine (TOUJEO MAX SOLOSTAR [place])     . Liraglutide (VICTOZA) 18 MG/3ML SOLN Inject 1.8 mg into the skin daily.     [name] loratadine-pseudoephedrine (CLARITIN-D 12-HOUR) 5-120 MG per tablet Take 1 tablet by mouth daily.    . metFORMIN (GLUCOPHAGE) 500 MG tablet Take 1,000-1,500 mg by mouth See admin instructions. Take 1000 mg by mouth in the morning and 1500 mg by mouth at bedtime    . naproxen sodium (ALEVE) 220 MG tablet Take 220 mg by mouth daily.    . pioglitazone (ACTOS) 45 MG tablet Take 45 mg by mouth daily.     [name] SYNTHROID 75 MCG tablet Take 75 mcg by mouth daily before breakfast.   12  . insulin glargine (LANTUS) 100 UNIT/ML injection Inject 44 Units into the skin every morning.      No facility-administered medications prior to visit.      EXAM:  BP (!) 142/86 (BP Location: Right Arm, Patient Position: Sitting, Cuff Size: Normal)   Pulse 81   Temp 98.6 F (37 C) (Oral)   Wt 248 lb 9.6 oz (112.8 kg)   BMI 35.67 kg/m   Body mass index is 35.67 kg/m.  GENERAL: vitals reviewed and listed above, alert, oriented, appears well hydrated and in no acute distress HEENT: atraumatic, conjunctiva  clear, no obvious abnormalities on inspection of external nose and ears  NECK: no obvious masses on inspection palpation  LUNGS: clear to auscultation bilaterally, no wheezes, rales or rhonchi, good air movement  Tender left upper left chest rib area   CV: HRRR, no clubbing cyanosis or  peripheral edema nl cap refill  Abdomen:  Sof,t normal bowel sounds without hepatosplenomegaly, no guarding rebound or masses no CVA tenderness MS: moves all extremities without noticeable focal  abnormality PSYCH: pleasant and cooperative, no obvious depression  or anxiety Lab Results  Component Value  Date   WBC 8.9 07/19/2018   HGB 14.5 07/19/2018   HCT 46.1 07/19/2018   PLT 279 07/19/2018   GLUCOSE 299 (H) 07/19/2018   CHOL 131 03/06/2014   TRIG 87 03/06/2014   HDL 35 03/06/2014   LDLCALC 79 03/06/2014   ALT 35 08/30/2011   AST 21 08/30/2011   NA 135 07/19/2018   K 4.6 07/19/2018   CL 99 07/19/2018   CREATININE 1.00 07/19/2018   BUN 18 07/19/2018   CO2 24 07/19/2018   TSH 1.58 08/30/2011   PSA 0.11 12/19/2010   HGBA1C 6.7 (A) 03/20/2014   BP Readings from Last 3 Encounters:  08/12/18 (!) 142/86  07/20/18 139/73  09/17/16 104/60   Wt Readings from Last 3 Encounters:  08/12/18 248 lb 9.6 oz (112.8 kg)  07/19/18 250 lb 7.1 oz (113.6 kg)  09/17/16 252 lb (114.3 kg)    ASSESSMENT AND PLAN:  Discussed the following assessment and plan:  Chest pain, unspecified type seems CWP - see text and hx   Uncontrolled type 2 diabetes mellitus with hyperglycemia, with long-term current use of insulin (HCC)  Medication management  Essential hypertension  Hyperlipidemia, unspecified hyperlipidemia type  Hospital discharge follow-up  BMI 35.0-35.9,adult Agree no further wu needed to day for cp seems cwp but consider  Other eval if  persistent or progressive  Intensifying   lsi and taking his meds  For dm ht hld etc  And we discussed this today   Wants to delay labs and get with dr A  In one month  reviewed hcm    See below  Consider  psa screen   ( neg fam hx and no sx)   -Patient advised to return or notify health care team  if  new concerns arise.  Patient Instructions   I agree  This is  musculo skeletal  Chest wall shoulder girdle pain .   FU if  Not getting better in another months.   Agree with     Intensification of f life style  And  Diabetes  .   Get this under control .  Get to see Althiemer   In a month as planned .   Wt Readings from Last 3 Encounters:  08/12/18 248 lb 9.6 oz (112.8 kg)  07/19/18 250 lb 7.1 oz (113.6 kg)  09/17/16 252 lb (114.3 kg)    Update colon cancer screening.   shingrix vaccine then   Come back    Yearly  Preventive visit or as needed .     Standley Brooking.  M.D.

## 2018-08-12 ENCOUNTER — Encounter: Payer: Self-pay | Admitting: Internal Medicine

## 2018-08-12 ENCOUNTER — Ambulatory Visit: Payer: 59 | Admitting: Internal Medicine

## 2018-08-12 VITALS — BP 142/86 | HR 81 | Temp 98.6°F | Wt 248.6 lb

## 2018-08-12 DIAGNOSIS — E785 Hyperlipidemia, unspecified: Secondary | ICD-10-CM

## 2018-08-12 DIAGNOSIS — Z79899 Other long term (current) drug therapy: Secondary | ICD-10-CM | POA: Diagnosis not present

## 2018-08-12 DIAGNOSIS — E1165 Type 2 diabetes mellitus with hyperglycemia: Secondary | ICD-10-CM | POA: Diagnosis not present

## 2018-08-12 DIAGNOSIS — I10 Essential (primary) hypertension: Secondary | ICD-10-CM | POA: Diagnosis not present

## 2018-08-12 DIAGNOSIS — Z09 Encounter for follow-up examination after completed treatment for conditions other than malignant neoplasm: Secondary | ICD-10-CM

## 2018-08-12 DIAGNOSIS — R079 Chest pain, unspecified: Secondary | ICD-10-CM

## 2018-08-12 DIAGNOSIS — Z6835 Body mass index (BMI) 35.0-35.9, adult: Secondary | ICD-10-CM

## 2018-08-12 DIAGNOSIS — Z794 Long term (current) use of insulin: Secondary | ICD-10-CM

## 2018-08-12 NOTE — Patient Instructions (Addendum)
I agree  This is  musculo skeletal  Chest wall shoulder girdle pain .   FU if  Not getting better in another months.   Agree with     Intensification of f life style  And  Diabetes  .   Get this under control .  Get to see Althiemer   In a month as planned .   Wt Readings from Last 3 Encounters:  08/12/18 248 lb 9.6 oz (112.8 kg)  07/19/18 250 lb 7.1 oz (113.6 kg)  09/17/16 252 lb (114.3 kg)   Update colon cancer screening.   shingrix vaccine then   Come back    Yearly  Preventive visit or as needed .

## 2018-08-25 ENCOUNTER — Encounter: Payer: Self-pay | Admitting: Internal Medicine

## 2018-08-25 ENCOUNTER — Ambulatory Visit: Payer: 59 | Admitting: Internal Medicine

## 2018-08-25 VITALS — BP 112/64 | HR 84 | Temp 98.2°F | Wt 247.3 lb

## 2018-08-25 DIAGNOSIS — Z794 Long term (current) use of insulin: Secondary | ICD-10-CM | POA: Diagnosis not present

## 2018-08-25 DIAGNOSIS — E1165 Type 2 diabetes mellitus with hyperglycemia: Secondary | ICD-10-CM | POA: Diagnosis not present

## 2018-08-25 DIAGNOSIS — J0191 Acute recurrent sinusitis, unspecified: Secondary | ICD-10-CM

## 2018-08-25 MED ORDER — AMOXICILLIN-POT CLAVULANATE 875-125 MG PO TABS: 1.0000 | ORAL_TABLET | Freq: Two times a day (BID) | ORAL | 0 refills | Status: DC

## 2018-08-25 NOTE — Patient Instructions (Addendum)
Add saline  Nose spray to loosen the mucous .   And add antibiotic   5-7 days     Sinusitis, Adult Sinusitis is inflammation of your sinuses. Sinuses are hollow spaces in the bones around your face. Your sinuses are located:  Around your eyes.  In the middle of your forehead.  Behind your nose.  In your cheekbones. Mucus normally drains out of your sinuses. When your nasal tissues become inflamed or swollen, mucus can become trapped or blocked. This allows bacteria, viruses, and fungi to grow, which leads to infection. Most infections of the sinuses are caused by a virus. Sinusitis can develop quickly. It can last for up to 4 weeks (acute) or for more than 12 weeks (chronic). Sinusitis often develops after a cold. What are the causes? This condition is caused by anything that creates swelling in the sinuses or stops mucus from draining. This includes:  Allergies.  Asthma.  Infection from bacteria or viruses.  Deformities or blockages in your nose or sinuses.  Abnormal growths in the nose (nasal polyps).  Pollutants, such as chemicals or irritants in the air.  Infection from fungi (rare). What increases the risk? You are more likely to develop this condition if you:  Have a weak body defense system (immune system).  Do a lot of swimming or diving.  Overuse nasal sprays.  Smoke. What are the signs or symptoms? The main symptoms of this condition are pain and a feeling of pressure around the affected sinuses. Other symptoms include:  Stuffy nose or congestion.  Thick drainage from your nose.  Swelling and warmth over the affected sinuses.  Headache.  Upper toothache.  A cough that may get worse at night.  Extra mucus that collects in the throat or the back of the nose (postnasal drip).  Decreased sense of smell and taste.  Fatigue.  A fever.  Sore throat.  Bad breath. How is this diagnosed? This condition is diagnosed based on:  Your  symptoms.  Your medical history.  A physical exam.  Tests to find out if your condition is acute or chronic. This may include: ? Checking your nose for nasal polyps. ? Viewing your sinuses using a device that has a light (endoscope). ? Testing for allergies or bacteria. ? Imaging tests, such as an MRI or CT scan. In rare cases, a bone biopsy may be done to rule out more serious types of fungal sinus disease. How is this treated? Treatment for sinusitis depends on the cause and whether your condition is chronic or acute.  If caused by a virus, your symptoms should go away on their own within 10 days. You may be given medicines to relieve symptoms. They include: ? Medicines that shrink swollen nasal passages (topical intranasal decongestants). ? Medicines that treat allergies (antihistamines). ? A spray that eases inflammation of the nostrils (topical intranasal corticosteroids). ? Rinses that help get rid of thick mucus in your nose (nasal saline washes).  If caused by bacteria, your health care provider may recommend waiting to see if your symptoms improve. Most bacterial infections will get better without antibiotic medicine. You may be given antibiotics if you have: ? A severe infection. ? A weak immune system.  If caused by narrow nasal passages or nasal polyps, you may need to have surgery. Follow these instructions at home: Medicines  Take, use, or apply over-the-counter and prescription medicines only as told by your health care provider. These may include nasal sprays.  If you were  prescribed an antibiotic medicine, take it as told by your health care provider. Do not stop taking the antibiotic even if you start to feel better. Hydrate and humidify   Drink enough fluid to keep your urine pale yellow. Staying hydrated will help to thin your mucus.  Use a cool mist humidifier to keep the humidity level in your home above 50%.  Inhale steam for 10-15 minutes, 3-4 times a  day, or as told by your health care provider. You can do this in the bathroom while a hot shower is running.  Limit your exposure to cool or dry air. Rest  Rest as much as possible.  Sleep with your head raised (elevated).  Make sure you get enough sleep each night. General instructions   Apply a warm, moist washcloth to your face 3-4 times a day or as told by your health care provider. This will help with discomfort.  Wash your hands often with soap and water to reduce your exposure to germs. If soap and water are not available, use hand sanitizer.  Do not smoke. Avoid being around people who are smoking (secondhand smoke).  Keep all follow-up visits as told by your health care provider. This is important. Contact a health care provider if:  You have a fever.  Your symptoms get worse.  Your symptoms do not improve within 10 days. Get help right away if:  You have a severe headache.  You have persistent vomiting.  You have severe pain or swelling around your face or eyes.  You have vision problems.  You develop confusion.  Your neck is stiff.  You have trouble breathing. Summary  Sinusitis is soreness and inflammation of your sinuses. Sinuses are hollow spaces in the bones around your face.  This condition is caused by nasal tissues that become inflamed or swollen. The swelling traps or blocks the flow of mucus. This allows bacteria, viruses, and fungi to grow, which leads to infection.  If you were prescribed an antibiotic medicine, take it as told by your health care provider. Do not stop taking the antibiotic even if you start to feel better.  Keep all follow-up visits as told by your health care provider. This is important. This information is not intended to replace advice given to you by your health care provider. Make sure you discuss any questions you have with your health care provider. Document Released: 07/23/2005 Document Revised: 12/23/2017 Document  Reviewed: 12/23/2017 Elsevier Interactive Patient Education  2019 [employer].

## 2018-08-25 NOTE — Progress Notes (Signed)
Chief Complaint  Patient presents with  . Sinus Problem    runny nose and congestion x 1 week. Over the weekend mucous started to thicken and turn green. Pressure in sinuses and HAs. No fevers. Taking Claritin-D, Zycam nose spray, Zycam rapid melts.     HPI: Rickey Wright 58 y.o. come in for sda "sinus infection"  Hx of recurrent sinusitis   Onset about 10 + days and then over weekend  Got the thick green stuff. ocass blood   Left  Side more than right and had turbinate reduction   Hx sinus infections and surgery .   Last one a while ago  bg had been getting better on keto trial but now 180  No fevefr no cough  and cp is better  To see endo   Early  February .  ROS: See pertinent positives and negatives per HPI.  Past Medical History:  Diagnosis Date  . ADD (attention deficit disorder)   . Allergy   . Anemia    hx iron deficiency   . Arthritis    "knees, shoulders, wrists, ankles, back" (06/22/2014)  . Asthma    "sports induced only"  . DDD (degenerative disc disease), lumbosacral   . Depression   . Diabetes mellitus, type 2 ([place])   . Hyperlipidemia   . Hypertension   . Hypothyroidism   . Localized osteoarthritis of left knee 06/27/2012  . Loose body of left knee 06/27/2012  . OSA (obstructive sleep apnea)    mild not on cpap  . Primary localized osteoarthritis of right knee 06/22/2014    Family History  Problem Relation Age of Onset  . Colon polyps Mother   . Heart disease Father        47s   . Aneurysm Father        AAA smoker  . Seizures Daughter   . Thyroid disease Daughter   . Cancer Maternal Grandfather        colon  . Diabetes Paternal Grandmother   . ADD / ADHD Son   . Learning disabilities Son     Social History   Socioeconomic History  . Marital status: Married    Spouse name: Not on file  . Number of children: Not on file  . Years of education: Not on file  . Highest education level: Not on file  Occupational History  . Not on file    Social Needs  . Financial resource strain: Not on file  . Food insecurity:    Worry: Not on file    Inability: Not on file  . Transportation needs:    Medical: Not on file    Non-medical: Not on file  Tobacco Use  . Smoking status: Never Smoker  . Smokeless tobacco: Never Used  Substance and Sexual Activity  . Alcohol use: Yes    Comment: very rarely, maybe 2 a yr..  . Drug use: No  . Sexual activity: Yes  Lifestyle  . Physical activity:    Days per week: Not on file    Minutes per session: Not on file  . Stress: Not on file  Relationships  . Social connections:    Talks on phone: Not on file    Gets together: Not on file    Attends religious service: Not on file    Active member of club or organization: Not on file    Attends meetings of clubs or organizations: Not on file    Relationship status: Not  on file  Other Topics Concern  . Not on file  Social History Narrative   5 children  Married    Education officer, [employer] with work   Non smoker    H H of 7 pet dog    Outpatient Medications Prior to Visit  Medication Sig Dispense Refill  . albuterol (PROVENTIL HFA;VENTOLIN HFA) 108 (90 Base) MCG/ACT inhaler Inhale 2 puffs into the lungs every 6 (six) hours as needed for wheezing or shortness of breath. 1 Inhaler 2  . albuterol (PROVENTIL) (2.5 MG/3ML) 0.083% nebulizer solution Take 3 mLs (2.5 mg total) by nebulization every 6 (six) hours as needed for wheezing or shortness of breath. 40 mL 0  . aspirin EC 81 MG tablet Take 81 mg by mouth daily.    [name] atorvastatin (LIPITOR) 10 MG tablet Take 10 mg by mouth daily.      . benazepril (LOTENSIN) 10 MG tablet Take 10 mg by mouth daily.      [name] ezetimibe (ZETIA) 10 MG tablet Take 10 mg by mouth daily.      . Insulin Glargine (TOUJEO MAX SOLOSTAR )     . Liraglutide (VICTOZA) 18 MG/3ML SOLN Inject 1.8 mg into the skin daily.     [name] loratadine-pseudoephedrine (CLARITIN-D 12-HOUR) 5-120 MG per tablet Take 1 tablet by mouth daily.    . metFORMIN  (GLUCOPHAGE) 500 MG tablet Take 1,000-1,500 mg by mouth See admin instructions. Take 1000 mg by mouth in the morning and 1500 mg by mouth at bedtime    . naproxen sodium (ALEVE) 220 MG tablet Take 220 mg by mouth daily.    . pioglitazone (ACTOS) 45 MG tablet Take 45 mg by mouth daily.     [name] SYNTHROID 75 MCG tablet Take 75 mcg by mouth daily before breakfast.   12   No facility-administered medications prior to visit.      EXAM:  BP 112/64 (BP Location: Right Arm, Patient Position: Sitting, Cuff Size: Normal)   Pulse 84   Temp 98.2 F (36.8 C) (Oral)   Wt 247 lb 4.8 oz (112.2 kg)   SpO2 96%   BMI 35.48 kg/m   Body mass index is 35.48 kg/m. WDWN in NAD  quiet respirations; mildly congested  somewhat hoarse. Non toxic . congestetd  HEENT: Normocephalic ;atraumatic , Eyes;  PERRL, EOMs  Full, lids and conjunctiva clear,,Ears: no deformities, canals nl, TM landmarks normal, Nose: no deformity or discharge but 2+ congested;face left minimallyy  tender Mouth : OP clear without lesion or edema . Neck: Supple without adenopathy or masses or bruits Chest:  Clear to A without wheezes rales or rhonchi CV:  S1-S2 no gallops or murmurs peripheral perfusion is normal Skin :nl perfusion and no acute rashes   PSYCH: pleasant and cooperative, no obvious depression or anxiety Lab Results  Component Value Date   WBC 8.9 07/19/2018   HGB 14.5 07/19/2018   HCT 46.1 07/19/2018   PLT 279 07/19/2018   GLUCOSE 299 (H) 07/19/2018   CHOL 131 03/06/2014   TRIG 87 03/06/2014   HDL 35 03/06/2014   LDLCALC 79 03/06/2014   ALT 35 08/30/2011   AST 21 08/30/2011   NA 135 07/19/2018   K 4.6 07/19/2018   CL 99 07/19/2018   CREATININE 1.00 07/19/2018   BUN 18 07/19/2018   CO2 24 07/19/2018   TSH 1.58 08/30/2011   PSA 0.11 12/19/2010   HGBA1C 6.7 (A) 03/20/2014   BP Readings from Last 3 Encounters:  08/25/18 112/64  08/12/18 (!) 142/86  07/20/18 139/73   Wt Readings from Last 3 Encounters:    08/25/18 247 lb 4.8 oz (112.2 kg)  08/12/18 248 lb 9.6 oz (112.8 kg)  07/19/18 250 lb 7.1 oz (113.6 kg)     ASSESSMENT AND PLAN:  Discussed the following assessment and plan:  Acute recurrent sinusitis, unspecified location - hx of trubinate reduction  left recurrence   Uncontrolled type 2 diabetes mellitus with hyperglycemia, with long-term current use of insulin ([place])  -Patient advised to return or notify health care team  if  new concerns arise.  Patient Instructions  Add saline  Nose spray to loosen the mucous .   And add antibiotic   5-7 days     Sinusitis, Adult Sinusitis is inflammation of your sinuses. Sinuses are hollow spaces in the bones around your face. Your sinuses are located:  Around your eyes.  In the middle of your forehead.  Behind your nose.  In your cheekbones. Mucus normally drains out of your sinuses. When your nasal tissues become inflamed or swollen, mucus can become trapped or blocked. This allows bacteria, viruses, and fungi to grow, which leads to infection. Most infections of the sinuses are caused by a virus. Sinusitis can develop quickly. It can last for up to 4 weeks (acute) or for more than 12 weeks (chronic). Sinusitis often develops after a cold. What are the causes? This condition is caused by anything that creates swelling in the sinuses or stops mucus from draining. This includes:  Allergies.  Asthma.  Infection from bacteria or viruses.  Deformities or blockages in your nose or sinuses.  Abnormal growths in the nose (nasal polyps).  Pollutants, such as chemicals or irritants in the air.  Infection from fungi (rare). What increases the risk? You are more likely to develop this condition if you:  Have a weak body defense system (immune system).  Do a lot of swimming or diving.  Overuse nasal sprays.  Smoke. What are the signs or symptoms? The main symptoms of this condition are pain and a feeling of pressure around  the affected sinuses. Other symptoms include:  Stuffy nose or congestion.  Thick drainage from your nose.  Swelling and warmth over the affected sinuses.  Headache.  Upper toothache.  A cough that may get worse at night.  Extra mucus that collects in the throat or the back of the nose (postnasal drip).  Decreased sense of smell and taste.  Fatigue.  A fever.  Sore throat.  Bad breath. How is this diagnosed? This condition is diagnosed based on:  Your symptoms.  Your medical history.  A physical exam.  Tests to find out if your condition is acute or chronic. This may include: ? Checking your nose for nasal polyps. ? Viewing your sinuses using a device that has a light (endoscope). ? Testing for allergies or bacteria. ? Imaging tests, such as an MRI or CT scan. In rare cases, a bone biopsy may be done to rule out more serious types of fungal sinus disease. How is this treated? Treatment for sinusitis depends on the cause and whether your condition is chronic or acute.  If caused by a virus, your symptoms should go away on their own within 10 days. You may be given medicines to relieve symptoms. They include: ? Medicines that shrink swollen nasal passages (topical intranasal decongestants). ? Medicines that treat allergies (antihistamines). ? A spray that eases inflammation of the nostrils (topical intranasal corticosteroids). ? Rinses that  help get rid of thick mucus in your nose (nasal saline washes).  If caused by bacteria, your health care provider may recommend waiting to see if your symptoms improve. Most bacterial infections will get better without antibiotic medicine. You may be given antibiotics if you have: ? A severe infection. ? A weak immune system.  If caused by narrow nasal passages or nasal polyps, you may need to have surgery. Follow these instructions at home: Medicines  Take, use, or apply over-the-counter and prescription medicines only as  told by your health care provider. These may include nasal sprays.  If you were prescribed an antibiotic medicine, take it as told by your health care provider. Do not stop taking the antibiotic even if you start to feel better. Hydrate and humidify   Drink enough fluid to keep your urine pale yellow. Staying hydrated will help to thin your mucus.  Use a cool mist humidifier to keep the humidity level in your home above 50%.  Inhale steam for 10-15 minutes, 3-4 times a day, or as told by your health care provider. You can do this in the bathroom while a hot shower is running.  Limit your exposure to cool or dry air. Rest  Rest as much as possible.  Sleep with your head raised (elevated).  Make sure you get enough sleep each night. General instructions   Apply a warm, moist washcloth to your face 3-4 times a day or as told by your health care provider. This will help with discomfort.  Wash your hands often with soap and water to reduce your exposure to germs. If soap and water are not available, use hand sanitizer.  Do not smoke. Avoid being around people who are smoking (secondhand smoke).  Keep all follow-up visits as told by your health care provider. This is important. Contact a health care provider if:  You have a fever.  Your symptoms get worse.  Your symptoms do not improve within 10 days. Get help right away if:  You have a severe headache.  You have persistent vomiting.  You have severe pain or swelling around your face or eyes.  You have vision problems.  You develop confusion.  Your neck is stiff.  You have trouble breathing. Summary  Sinusitis is soreness and inflammation of your sinuses. Sinuses are hollow spaces in the bones around your face.  This condition is caused by nasal tissues that become inflamed or swollen. The swelling traps or blocks the flow of mucus. This allows bacteria, viruses, and fungi to grow, which leads to infection.  If you  were prescribed an antibiotic medicine, take it as told by your health care provider. Do not stop taking the antibiotic even if you start to feel better.  Keep all follow-up visits as told by your health care provider. This is important. This information is not intended to replace advice given to you by your health care provider. Make sure you discuss any questions you have with your health care provider. Document Released: 07/23/2005 Document Revised: 12/23/2017 Document Reviewed: 12/23/2017 Elsevier Interactive Patient Education  2019 [place] K. Kassaundra Hair M.D.

## 2018-09-01 DIAGNOSIS — E1165 Type 2 diabetes mellitus with hyperglycemia: Secondary | ICD-10-CM | POA: Diagnosis not present

## 2018-09-01 DIAGNOSIS — E039 Hypothyroidism, unspecified: Secondary | ICD-10-CM | POA: Diagnosis not present

## 2018-09-01 DIAGNOSIS — Z794 Long term (current) use of insulin: Secondary | ICD-10-CM | POA: Diagnosis not present

## 2018-09-08 DIAGNOSIS — E559 Vitamin D deficiency, unspecified: Secondary | ICD-10-CM | POA: Diagnosis not present

## 2018-09-08 DIAGNOSIS — E782 Mixed hyperlipidemia: Secondary | ICD-10-CM | POA: Diagnosis not present

## 2018-09-08 DIAGNOSIS — E1165 Type 2 diabetes mellitus with hyperglycemia: Secondary | ICD-10-CM | POA: Diagnosis not present

## 2018-10-23 ENCOUNTER — Telehealth: Payer: Self-pay | Admitting: Internal Medicine

## 2018-10-23 ENCOUNTER — Other Ambulatory Visit: Payer: Self-pay

## 2018-10-23 MED ORDER — ALBUTEROL SULFATE HFA 108 (90 BASE) MCG/ACT IN AERS: 2.0000 | INHALATION_SPRAY | Freq: Four times a day (QID) | RESPIRATORY_TRACT | 2 refills | Status: DC | PRN

## 2018-10-23 NOTE — Telephone Encounter (Signed)
Copied from [place] [id number]. Topic: Quick Communication - Rx Refill/Question >> Oct 23, 2018  8:53 AM Burchel, Abbi R wrote: Medication: albuterol (PROVENTIL HFA;VENTOLIN HFA) 108 (90 Base) MCG/ACT inhaler  Preferred Pharmacy: [id number] - [place], [place]. AT [address] [place] [address] Phone: [phone] Fax: [phone]   Pt was advised that RX refills may take up to 3 business days. We ask that you follow-up with your pharmacy.

## 2018-10-24 NOTE — Telephone Encounter (Signed)
Has already been filled 

## 2018-11-03 ENCOUNTER — Ambulatory Visit: Payer: Self-pay | Admitting: *Deleted

## 2018-11-03 NOTE — Telephone Encounter (Signed)
Pt reports LGT, dry cough, increased fatigue and chest tightness, all onset this AM. States temp this AM 98.6, has been gradually rising to max of 99.6 Reports dry cough, mild constant chest tightness with mild SOB at times. Speech non- halting during call. Pt states he is type 2 DM and "I've read a large percentage of those who die are diabetic." Denies body aches, reports  Increased fatigue, "Sleeping more than usual." States BS have been running 110-130's. Care advise given per protocol. Directed to ED if chest tightness, SOB worsens.  Reviewed precautions, criteria.  Pt has iPhone, email verified. Please advise regarding appt; states "Very worried." CB# [phone] Reason for Disposition . [1] Caller concerned that exposure to COVID-19 occurred BUT [2] does not meet COVID-19 EXPOSURE criteria from [facility]  Answer Assessment - Initial Assessment Questions 1. CONFIRMED CASE: "Who is the person with the confirmed COVID-19 infection that you were exposed to?"     N/A 2. PLACE of CONTACT: "Where were you when you were exposed to COVID-19  (coronavirus disease 2019)?" (e.g., city, state, country)     N/A 3. TYPE of CONTACT: "How much contact was there?" (e.g., live in same house, work in same office, same school)     N/A 4. DATE of CONTACT: "When did you have contact with a coronavirus patient?" (e.g., days)     N/A 5. DURATION of CONTACT: "How long were you in contact with the COVID-19 (coronavirus disease) patient?" (e.g., a few seconds, passed by person, a few minutes, live with the patient)     N/A 6. SYMPTOMS: "Do you have any symptoms?" (e.g., fever, cough, breathing difficulty)    Dry cough, LGT, chest tightness, mild SOB  8. HIGH RISK: "Do you have any heart or lung problems? Do you have a weakened immune system?" (e.g., CHF, COPD, asthma, HIV positive, chemotherapy, renal failure, diabetes mellitus, sickle cell anemia)    DM type 2  Protocols used: CORONAVIRUS (COVID-19)  EXPOSURE-A-AH

## 2018-11-04 NOTE — Telephone Encounter (Signed)
Called pt and he states that he would like to wait a day to see if fever stays down or if his chest tightens up again he will call back pt thinks it is allergies . Tried to offer pt evisit but declined at this time

## 2018-11-05 ENCOUNTER — Telehealth: Payer: 59 | Admitting: Nurse Practitioner

## 2018-11-05 DIAGNOSIS — R6889 Other general symptoms and signs: Principal | ICD-10-CM

## 2018-11-05 DIAGNOSIS — Z20822 Contact with and (suspected) exposure to covid-19: Secondary | ICD-10-CM

## 2018-11-05 NOTE — Progress Notes (Signed)
E-Visit for Corona Virus Screening  Based on your current symptoms, you may very well have the virus, however your symptoms are mild. Currently, not all patients are being tested. If the symptoms are mild and there is not a known exposure, performing the test is not indicated.  Coronavirus disease 2019 (COVID-19) is a respiratory illness that can spread from person to person. The virus that causes COVID-19 is a new virus that was first identified in the country of [place] but is now found in multiple other countries and has spread to the [place].  Symptoms associated with the virus are mild to severe fever, cough, and shortness of breath. There is currently no vaccine to protect against COVID-19, and there is no specific antiviral treatment for the virus.   To be considered HIGH RISK for Coronavirus (COVID-19), you have to meet the following criteria:  . Traveled to [place], [place], [place], [place] or [place]; or in the [place] to [place], [place], [place], or [place]; and have fever, cough, and shortness of breath within the last 2 weeks of travel OR  . Been in close contact with a person diagnosed with COVID-19 within the last 2 weeks and have fever, cough, and shortness of breath  . IF YOU DO NOT MEET THESE CRITERIA, YOU ARE CONSIDERED LOW RISK FOR COVID-19.   It is vitally important that if you feel that you have an infection such as this virus or any other virus that you stay home and away from places where you may spread it to others.  You should self-quarantine for 14 days if you have symptoms that could potentially be coronavirus and avoid contact with people age 77 and older.   You can use medication such as delsym or mucinex for cough  You may also take acetaminophen (Tylenol) as needed for fever.   Reduce your risk of any infection by using the same precautions used for avoiding the common cold or flu:  [name] Wash your hands often with soap and warm water for at least 20  seconds.  If soap and water are not readily available, use an alcohol-based hand sanitizer with at least 60% alcohol.  . If coughing or sneezing, cover your mouth and nose by coughing or sneezing into the elbow areas of your shirt or coat, into a tissue or into your sleeve (not your hands). . Avoid shaking hands with others and consider head nods or verbal greetings only. . Avoid touching your eyes, nose, or mouth with unwashed hands.  . Avoid close contact with people who are sick. . Avoid places or events with large numbers of people in one location, like concerts or sporting events. . Carefully consider travel plans you have or are making. . If you are planning any travel outside or inside the [place], visit the CDC's Travelers' Health webpage for the latest health notices. . If you have some symptoms but not all symptoms, continue to monitor at home and seek medical attention if your symptoms worsen. . If you are having a medical emergency, call 911.  HOME CARE . Only take medications as instructed by your medical team. . Drink plenty of fluids and get plenty of rest. . A steam or ultrasonic humidifier can help if you have congestion.   GET HELP RIGHT AWAY IF: . You develop worsening fever. . You become short of breath . You cough up blood. . Your symptoms become more severe MAKE SURE YOU   Understand these instructions.  Will watch your condition.  Will get help right away if you are not doing well or get worse.  Your e-visit answers were reviewed by a board certified advanced clinical practitioner to complete your personal care plan.  Depending on the condition, your plan could have included both over the counter or prescription medications.  If there is a problem please reply once you have received a response from your provider. Your safety is important to [place].  If you have drug allergies check your prescription carefully.    You can use MyChart to ask questions about today's visit,  request a non-urgent call back, or ask for a work or school excuse for 24 hours related to this e-Visit. If it has been greater than 24 hours you will need to follow up with your provider, or enter a new e-Visit to address those concerns. You will get an e-mail in the next two days asking about your experience.  I hope that your e-visit has been valuable and will speed your recovery. Thank you for using e-visits.  5 minutes spent reviewing and documenting in chart.

## 2018-11-07 ENCOUNTER — Encounter: Payer: Self-pay | Admitting: Internal Medicine

## 2018-11-07 ENCOUNTER — Ambulatory Visit (INDEPENDENT_AMBULATORY_CARE_PROVIDER_SITE_OTHER): Payer: 59 | Admitting: Internal Medicine

## 2018-11-07 ENCOUNTER — Other Ambulatory Visit: Payer: Self-pay

## 2018-11-07 DIAGNOSIS — J22 Unspecified acute lower respiratory infection: Secondary | ICD-10-CM | POA: Diagnosis not present

## 2018-11-07 DIAGNOSIS — J0191 Acute recurrent sinusitis, unspecified: Secondary | ICD-10-CM | POA: Diagnosis not present

## 2018-11-07 MED ORDER — AMOXICILLIN-POT CLAVULANATE 875-125 MG PO TABS: 1.0000 | ORAL_TABLET | Freq: Two times a day (BID) | ORAL | 0 refills | Status: DC

## 2018-11-07 NOTE — Progress Notes (Signed)
Virtual Visit via Video Note  I connected with@ on 11/07/18 at  2:30 PM EDT by a video enabled telemedicine application and verified that I am speaking with the correct person using two identifiers. Location patient: home Location provider:work or home office Persons participating in the virtual visit: patient, provider  [place] national recommendations  regarding COVID 19 pandemic   video visit is advised over in office visit for this patient.  Discussed the limitations of evaluation and management by telemedicine and  availability of in person appointments. The patient expressed understanding and agreed to proceed.   HPI: Rickey Wright Had e visit out side of PCP   Yesterday of concern   Felt viral  With uriis x and lwo grade temp 99.8 aoff and on   And malaise but sinus pain and pressure as  In past . For the psat 5 days  Coming up on weekend and  Felt to check in  Taking  nyquil   And some Claritin   ROS: See pertinent positives and negatives per HPI.no cp  Some sob  When he walked up hill at park but not now    Cough mild productive  No dry used his inhaler  No particular exposures travel   Larger family unit    No Has known allergies and this is the season  Past Medical History:  Diagnosis Date  . ADD (attention deficit disorder)   . Allergy   . Anemia    hx iron deficiency   . Arthritis    "knees, shoulders, wrists, ankles, back" (06/22/2014)  . Asthma    "sports induced only"  . DDD (degenerative disc disease), lumbosacral   . Depression   . Diabetes mellitus, type 2 ([place])   . Hyperlipidemia   . Hypertension   . Hypothyroidism   . Localized osteoarthritis of left knee 06/27/2012  . Loose body of left knee 06/27/2012  . OSA (obstructive sleep apnea)    mild not on cpap  . Primary localized osteoarthritis of right knee 06/22/2014    Past Surgical History:  Procedure Laterality Date  . HAND TENDON SURGERY Left ~ 2008 X 2  . JOINT REPLACEMENT    . KNEE ARTHROSCOPY  Right 1981; 1993; ~ 2006  . KNEE ARTHROSCOPY  06/27/2012   Procedure: ARTHROSCOPY KNEE;  Surgeon: Johnny Bridge, MD;  Location: [facility];  Service: Orthopedics;  Laterality: Left;  left knee arthroscopy with removal loose foreign body , with debridement/shaving( chondroplasty)  . RHINOPLASTY  2000's  . TOTAL KNEE ARTHROPLASTY Right 06/22/2014  . TOTAL KNEE ARTHROPLASTY Right 06/22/2014   Procedure: RIGHT TOTAL KNEE ARTHROPLASTY;  Surgeon: Johnny Bridge, MD;  Location: [facility];  Service: Orthopedics;  Laterality: Right;    Family History  Problem Relation Age of Onset  . Colon polyps Mother   . Heart disease Father        55s   . Aneurysm Father        AAA smoker  . Seizures Daughter   . Thyroid disease Daughter   . Cancer Maternal Grandfather        colon  . Diabetes Paternal Grandmother   . ADD / ADHD Son   . Learning disabilities Son     SOCIAL HX:    Current Outpatient Medications:  .  albuterol (PROVENTIL HFA;VENTOLIN HFA) 108 (90 Base) MCG/ACT inhaler, Inhale 2 puffs into the lungs every 6 (six) hours as needed for wheezing or shortness of breath., Disp: 1 Inhaler,  Rfl: 2 .  albuterol (PROVENTIL) (2.5 MG/3ML) 0.083% nebulizer solution, Take 3 mLs (2.5 mg total) by nebulization every 6 (six) hours as needed for wheezing or shortness of breath., Disp: 40 mL, Rfl: 0 .  amoxicillin-clavulanate (AUGMENTIN) 875-125 MG tablet, Take 1 tablet by mouth every 12 (twelve) hours. For sinusitis, Disp: 14 tablet, Rfl: 0 .  aspirin EC 81 MG tablet, Take 81 mg by mouth daily., Disp: , Rfl:  .  atorvastatin (LIPITOR) 10 MG tablet, Take 10 mg by mouth daily.  , Disp: , Rfl:  .  benazepril (LOTENSIN) 10 MG tablet, Take 10 mg by mouth daily.  , Disp: , Rfl:  .  ezetimibe (ZETIA) 10 MG tablet, Take 10 mg by mouth daily.  , Disp: , Rfl:  .  Insulin Glargine (TOUJEO MAX SOLOSTAR [place]), , Disp: , Rfl:  .  Liraglutide (VICTOZA) 18 MG/3ML SOLN, Inject 1.8 mg into the skin daily. ,  Disp: , Rfl:  .  loratadine-pseudoephedrine (CLARITIN-D 12-HOUR) 5-120 MG per tablet, Take 1 tablet by mouth daily., Disp: , Rfl:  .  metFORMIN (GLUCOPHAGE) 500 MG tablet, Take 1,000-1,500 mg by mouth See admin instructions. Take 1000 mg by mouth in the morning and 1500 mg by mouth at bedtime, Disp: , Rfl:  .  naproxen sodium (ALEVE) 220 MG tablet, Take 220 mg by mouth daily., Disp: , Rfl:  .  pioglitazone (ACTOS) 45 MG tablet, Take 45 mg by mouth daily. , Disp: , Rfl:  .  SYNTHROID 75 MCG tablet, Take 75 mcg by mouth daily before breakfast. , Disp: , Rfl: 12  EXAM:  VITALS per patient if applicable:  GENERAL: alert, oriented, appears well and in no acute distress no cough during interview nl color   HEENT: atraumatic, conjunttiva clear, no obvious abnormalities on inspection of external nose and ears Congested and  Tired non toxic appearing    Face frontal and cheek  Pressure  NECK: normal movements of the head and neck  LUNGS: on inspection no signs of respiratory distress, breathing rate appears normal, no obvious gross SOB, gasping or wheezing  CV: no obvious cyanosis  MS: moves all visible extremities without noticeable abnormality  PSYCH/NEURO: pleasant and cooperative, no obvious depression or anxiety, speech and thought processing grossly intact  ASSESSMENT AND PLAN:  Discussed the following assessment and plan:  Acute recurrent sinusitis, unspecified location - empriric rx  in addition of supportive care based on past hx   Acute respiratory infection - certainly could have covid also  see e visit  supportive care and dis alarm sx   self isolation  ?s asked ;  covid in community so assume no safer places  Continue precautions   empiric rx for sinusitis as in past    If develops alarm sx findings then seek emergent care and contact us  caution se of allergy meds also stay hydrated dm control.   Expectant management and discussion of plan and treatment with patient with  opportunity to ask questions and all were answered. The patient agreed with the plan and demonstrated an understanding of the instructions.   The patient was advised to contact   if worsening having concerns    or if the condition fails to improve as anticipated.   Shanon Ace, MD

## 2018-11-20 ENCOUNTER — Telehealth: Payer: 59 | Admitting: Physician Assistant

## 2018-11-20 DIAGNOSIS — R0602 Shortness of breath: Secondary | ICD-10-CM

## 2018-11-20 NOTE — Progress Notes (Signed)
  E-Visit for [employer] Virus Screening  Based on what you have shared with me, you need to seek an evaluation for a severe illness that is causing your symptoms which may be coronavirus or some other illness. I recommend that you be seen and evaluated "face to face". Our Emergency Departments are best equipped to handle patients with severe symptoms.  Giving the frequent shortness of breath you need to be seen at Urgent Care or ER for assessment.    I recommend the following:  . If you are having a true medical emergency please call 911. . If you are considered high risk for Corona virus because of a known exposure, fever, shortness of breath and cough, OR if you have severe symptoms of any kind, seek medical care at an emergency room.  . Please call ahead and tell them that you were seen by telemedicine and they have recommended that you have a face to face evaluation. . [facility] Emergency Department [place], [place], [place] [address] [phone]  . [facility] Emergency Department [place], [place], [place] [address] [phone]  . [facility] Emergency Department [place], [place], [place] [address] [phone]  . [facility] Emergency Department [address] [place], [place], [place] [address] [phone]  . [facility] Emergency Department [place], [place], [place] [address] [phone]  NOTE: If you entered your credit card information for this eVisit, you will not be charged. You may see a "hold" on your card for the $35 but that hold will drop off and you will not have a charge processed.   Your e-visit answers were reviewed by a board certified advanced clinical practitioner to complete your personal care plan.  Thank you for using e-Visits.

## 2018-11-26 ENCOUNTER — Other Ambulatory Visit: Payer: Self-pay

## 2018-11-26 ENCOUNTER — Encounter: Payer: Self-pay | Admitting: Internal Medicine

## 2018-11-26 ENCOUNTER — Ambulatory Visit (INDEPENDENT_AMBULATORY_CARE_PROVIDER_SITE_OTHER): Payer: 59 | Admitting: Internal Medicine

## 2018-11-26 DIAGNOSIS — R6889 Other general symptoms and signs: Secondary | ICD-10-CM

## 2018-11-26 DIAGNOSIS — R5381 Other malaise: Secondary | ICD-10-CM

## 2018-11-26 DIAGNOSIS — R0989 Other specified symptoms and signs involving the circulatory and respiratory systems: Secondary | ICD-10-CM | POA: Diagnosis not present

## 2018-11-26 DIAGNOSIS — R509 Fever, unspecified: Secondary | ICD-10-CM | POA: Diagnosis not present

## 2018-11-26 DIAGNOSIS — R5383 Other fatigue: Secondary | ICD-10-CM

## 2018-11-26 DIAGNOSIS — Z20822 Contact with and (suspected) exposure to covid-19: Secondary | ICD-10-CM

## 2018-11-26 NOTE — Progress Notes (Signed)
Virtual Visit via Video Note  I connected with@ on 11/26/18 at  3:00 PM EDT by a video enabled telemedicine application and verified that I am speaking with the correct person using two identifiers. Location patient: home Location provider:work or home office Persons participating in the virtual visit: patient, provider  [place] national recommendations  regarding COVID 19 pandemic   video visit is advised over in office visit for this patient.  Discussed the limitations of evaluation and management by telemedicine and  availability of in person appointments. The patient expressed understanding and agreed to proceed.   HPI: Rickey Wright Presents for video visit because of persistent symptoms after treatment for acute sinusitis in April 3.  He had a follow-up screening online for COVID symptoms because of his shortness of breath and continued symptoms.  He did not end up going to the ED because lack of fever and other alarm findings. Since that time he still feels like he has some shortness of breath but uses albuterol with help. He has low-grade fever 99.6 and feels like he has mono.  When he takes a walk does get some short of breath going up hills.  His cough is not worse improved and sinuses are better after the treatment.  Uncertain if he should take any other action.  Household of 3 at this point.  Is social isolating from the number of large family. Working from home. No new associations or contacts. They did get a pulse ox further home and his pulse ox has been 98. Not sure of the accuracy of his thermometer.   ROS: See pertinent positives and negatives per HPI.  No current chest pain new symptoms malaise and fatigue as above.  Past Medical History:  Diagnosis Date  . ADD (attention deficit disorder)   . Allergy   . Anemia    hx iron deficiency   . Arthritis    "knees, shoulders, wrists, ankles, back" (06/22/2014)  . Asthma    "sports induced only"  . DDD (degenerative  disc disease), lumbosacral   . Depression   . Diabetes mellitus, type 2 ([place])   . Hyperlipidemia   . Hypertension   . Hypothyroidism   . Localized osteoarthritis of left knee 06/27/2012  . Loose body of left knee 06/27/2012  . OSA (obstructive sleep apnea)    mild not on cpap  . Primary localized osteoarthritis of right knee 06/22/2014    Past Surgical History:  Procedure Laterality Date  . HAND TENDON SURGERY Left ~ 2008 X 2  . JOINT REPLACEMENT    . KNEE ARTHROSCOPY Right 1981; 1993; ~ 2006  . KNEE ARTHROSCOPY  06/27/2012   Procedure: ARTHROSCOPY KNEE;  Surgeon: Johnny Bridge, MD;  Location: [facility];  Service: Orthopedics;  Laterality: Left;  left knee arthroscopy with removal loose foreign body , with debridement/shaving( chondroplasty)  . RHINOPLASTY  2000's  . TOTAL KNEE ARTHROPLASTY Right 06/22/2014  . TOTAL KNEE ARTHROPLASTY Right 06/22/2014   Procedure: RIGHT TOTAL KNEE ARTHROPLASTY;  Surgeon: Johnny Bridge, MD;  Location: [facility];  Service: Orthopedics;  Laterality: Right;    Family History  Problem Relation Age of Onset  . Colon polyps Mother   . Heart disease Father        89s   . Aneurysm Father        AAA smoker  . Seizures Daughter   . Thyroid disease Daughter   . Cancer Maternal Grandfather  colon  . Diabetes Paternal Grandmother   . ADD / ADHD Son   . Learning disabilities Son     Social History   Tobacco Use  . Smoking status: Never Smoker  . Smokeless tobacco: Never Used  Substance Use Topics  . Alcohol use: Yes    Comment: very rarely, maybe 2 a yr..  . Drug use: No      Current Outpatient Medications:  .  albuterol (PROVENTIL HFA;VENTOLIN HFA) 108 (90 Base) MCG/ACT inhaler, Inhale 2 puffs into the lungs every 6 (six) hours as needed for wheezing or shortness of breath., Disp: 1 Inhaler, Rfl: 2 .  albuterol (PROVENTIL) (2.5 MG/3ML) 0.083% nebulizer solution, Take 3 mLs (2.5 mg total) by nebulization every 6 (six)  hours as needed for wheezing or shortness of breath., Disp: 40 mL, Rfl: 0 .  amoxicillin-clavulanate (AUGMENTIN) 875-125 MG tablet, Take 1 tablet by mouth every 12 (twelve) hours. For sinusitis, Disp: 14 tablet, Rfl: 0 .  aspirin EC 81 MG tablet, Take 81 mg by mouth daily., Disp: , Rfl:  .  atorvastatin (LIPITOR) 10 MG tablet, Take 10 mg by mouth daily.  , Disp: , Rfl:  .  benazepril (LOTENSIN) 10 MG tablet, Take 10 mg by mouth daily.  , Disp: , Rfl:  .  ezetimibe (ZETIA) 10 MG tablet, Take 10 mg by mouth daily.  , Disp: , Rfl:  .  Insulin Glargine (TOUJEO MAX SOLOSTAR [place]), , Disp: , Rfl:  .  Liraglutide (VICTOZA) 18 MG/3ML SOLN, Inject 1.8 mg into the skin daily. , Disp: , Rfl:  .  loratadine-pseudoephedrine (CLARITIN-D 12-HOUR) 5-120 MG per tablet, Take 1 tablet by mouth daily., Disp: , Rfl:  .  metFORMIN (GLUCOPHAGE) 500 MG tablet, Take 1,000-1,500 mg by mouth See admin instructions. Take 1000 mg by mouth in the morning and 1500 mg by mouth at bedtime, Disp: , Rfl:  .  naproxen sodium (ALEVE) 220 MG tablet, Take 220 mg by mouth daily., Disp: , Rfl:  .  pioglitazone (ACTOS) 45 MG tablet, Take 45 mg by mouth daily. , Disp: , Rfl:  .  SYNTHROID 75 MCG tablet, Take 75 mcg by mouth daily before breakfast. , Disp: , Rfl: 12  EXAM: BP Readings from Last 3 Encounters:  08/25/18 112/64  08/12/18 (!) 142/86  07/20/18 139/73    VITALS per patient if applicable:  GENERAL: alert, oriented, appears well and in no acute distress   HEENT: atraumatic, conjunttiva clear, no obvious abnormalities on inspection of external nose and ears  NECK: normal movements of the head and neck  LUNGS: on inspection no signs of respiratory distress, breathing rate appears normal, no obvious gross SOB, gasping or wheezing  CV: no obvious cyanosis  MS: moves all visible extremities without noticeable abnormality  PSYCH/NEURO: pleasant and cooperative, no obvious depression or anxiety, speech and thought processing  grossly intact   ASSESSMENT AND PLAN:  Discussed the following assessment and plan:  Low grade fever  Malaise and fatigue  Respiratory symptoms - persistent but improved   Suspected Covid-19 Virus Infection as per screening  interview   in past ?  Counseled.  Discussed definition of significant fever at this time still sounds a viral after treatment for his sinusitis. His good oxygenation is very reassuring.  Have him check his temp twice daily for 10 to 14 days and send in the readings but contact us if status is worse. At this time there is no compelling interest for chest x-ray and lab  work based on community situation.  Continue his physical isolation as he is doing.  He looks fairly well in this video visit today. Acknowledged i the difficulty of isolated at home and have illness that zaps his energy.  Expectant management and discussion of plan and treatment with patient with opportunity to ask questions and all were answered. The patient agreed with the plan and demonstrated an understanding of the instructions.   The patient was advised to call back or seek an in-person evaluation if worsening  or having concerns .   Shanon Ace, MD

## 2018-11-27 ENCOUNTER — Ambulatory Visit: Payer: 59 | Admitting: Internal Medicine

## 2018-12-16 DIAGNOSIS — Z794 Long term (current) use of insulin: Secondary | ICD-10-CM | POA: Diagnosis not present

## 2018-12-16 DIAGNOSIS — E782 Mixed hyperlipidemia: Secondary | ICD-10-CM | POA: Diagnosis not present

## 2018-12-16 DIAGNOSIS — E1165 Type 2 diabetes mellitus with hyperglycemia: Secondary | ICD-10-CM | POA: Diagnosis not present

## 2019-01-02 ENCOUNTER — Telehealth: Payer: Self-pay

## 2019-01-02 MED ORDER — PERMETHRIN 5 % EX CREA: 1.0000 "application " | TOPICAL_CREAM | Freq: Once | CUTANEOUS | 0 refills | Status: AC

## 2019-01-02 NOTE — Telephone Encounter (Signed)
Copied from [place] [id number]. Topic: General - Inquiry >> Jan 02, 2019 11:58 AM Richardo Priest, NT wrote: Reason for CRM: Patient's wife is calling in stating that she has been diagnosed with scabies and that her dermatologist recommends the family be prescribed/treated for this as well. Prescription is permechrin 5% apply once weekly. Call back for wife is [phone]. Patient's whole family has been exposed, Arnet([id number]), Kathleen(03-13-62), Terrence Dupont (07-13-98), and Cecilia(11-23-99).

## 2019-01-02 NOTE — Telephone Encounter (Signed)
Ok to send in medication for scabies as requested  For Rickey Wright and Rickey Wright

## 2019-01-02 NOTE — Telephone Encounter (Signed)
rx sent In

## 2019-01-12 LAB — HM DIABETES EYE EXAM

## 2019-02-16 ENCOUNTER — Other Ambulatory Visit: Payer: Self-pay | Admitting: Orthopedic Surgery

## 2019-02-16 DIAGNOSIS — M542 Cervicalgia: Secondary | ICD-10-CM

## 2019-02-23 ENCOUNTER — Encounter: Payer: Self-pay | Admitting: Internal Medicine

## 2019-02-25 ENCOUNTER — Other Ambulatory Visit: Payer: Self-pay | Admitting: Orthopedic Surgery

## 2019-03-01 ENCOUNTER — Other Ambulatory Visit: Payer: Self-pay

## 2019-03-01 ENCOUNTER — Ambulatory Visit
Admission: RE | Admit: 2019-03-01 | Discharge: 2019-03-01 | Disposition: A | Discharge status: Home / Self Care | Payer: 59 | Source: Ambulatory Visit | Attending: Orthopedic Surgery | Admitting: Orthopedic Surgery

## 2019-03-01 DIAGNOSIS — M542 Cervicalgia: Secondary | ICD-10-CM

## 2019-03-11 ENCOUNTER — Other Ambulatory Visit: Payer: 59

## 2019-07-20 ENCOUNTER — Telehealth (INDEPENDENT_AMBULATORY_CARE_PROVIDER_SITE_OTHER): Payer: 59 | Admitting: Family Medicine

## 2019-07-20 ENCOUNTER — Other Ambulatory Visit: Payer: Self-pay

## 2019-07-20 DIAGNOSIS — Z20828 Contact with and (suspected) exposure to other viral communicable diseases: Secondary | ICD-10-CM

## 2019-07-20 DIAGNOSIS — Z20822 Contact with and (suspected) exposure to covid-19: Secondary | ICD-10-CM

## 2019-07-20 NOTE — Progress Notes (Signed)
Virtual Visit via Video Note  I connected with Rickey Wright. Woodhams on 07/20/19 at  1:00 PM EST by a video enabled telemedicine application 2/2 [id number] pandemic and verified that I am speaking with the correct person using two identifiers.  Location patient: car Location provider:work or home office Persons participating in the virtual visit: patient, provider, Rickey Wright- daughter  I discussed the limitations of evaluation and management by telemedicine and the availability of in person appointments. The patient expressed understanding and agreed to proceed.   HPI: Pt is a 58 yo male with pmh sig for DM II, HTN, HLD, hypothyroidism, ADD, mild OSA, OA followed by Dr. Regis Bill.  Pt seen for acute concern.  Pt 's granddaughter tested positive for COVID on 12/8 .  Pt tested neg on 12/10 but recently developed symptoms.  Pt now with elevated temp (Tmax 99), chills, rhinorrhea, increased fatigue, sore throat, cough.  Pt states other family members are also sick.  Pt in line at [facility] for COVID-19 testing.  ROS: See pertinent positives and negatives per HPI.  Past Medical History:  Diagnosis Date  . ADD (attention deficit disorder)   . Allergy   . Anemia    hx iron deficiency   . Arthritis    "knees, shoulders, wrists, ankles, back" (06/22/2014)  . Asthma    "sports induced only"  . DDD (degenerative disc disease), lumbosacral   . Depression   . Diabetes mellitus, type 2 ([place])   . Hyperlipidemia   . Hypertension   . Hypothyroidism   . Localized osteoarthritis of left knee 06/27/2012  . Loose body of left knee 06/27/2012  . OSA (obstructive sleep apnea)    mild not on cpap  . Primary localized osteoarthritis of right knee 06/22/2014    Past Surgical History:  Procedure Laterality Date  . HAND TENDON SURGERY Left ~ 2008 X 2  . JOINT REPLACEMENT    . KNEE ARTHROSCOPY Right 1981; 1993; ~ 2006  . KNEE ARTHROSCOPY  06/27/2012   Procedure: ARTHROSCOPY KNEE;  Surgeon: Johnny Bridge, MD;  Location:  [facility];  Service: Orthopedics;  Laterality: Left;  left knee arthroscopy with removal loose foreign body , with debridement/shaving( chondroplasty)  . RHINOPLASTY  2000's  . TOTAL KNEE ARTHROPLASTY Right 06/22/2014  . TOTAL KNEE ARTHROPLASTY Right 06/22/2014   Procedure: RIGHT TOTAL KNEE ARTHROPLASTY;  Surgeon: Johnny Bridge, MD;  Location: [facility];  Service: Orthopedics;  Laterality: Right;    Family History  Problem Relation Age of Onset  . Colon polyps Mother   . Heart disease Father        64s   . Aneurysm Father        AAA smoker  . Seizures Daughter   . Thyroid disease Daughter   . Cancer Maternal Grandfather        colon  . Diabetes Paternal Grandmother   . ADD / ADHD Son   . Learning disabilities Son     SOCIAL HX: Pt shows this provider his [employer] face mask.  Pt is originally from [place], [place] where he went to school at [employer] and completed a semester at [employer].  Pt's father was an Software engineer in [place].  Pt's brother is a Writer of the [employer] at [employer] and is now back in [facility].   Current Outpatient Medications:  .  albuterol (PROVENTIL HFA;VENTOLIN HFA) 108 (90 Base) MCG/ACT inhaler, Inhale 2 puffs into the lungs every 6 (six) hours  as needed for wheezing or shortness of breath., Disp: 1 Inhaler, Rfl: 2 .  albuterol (PROVENTIL) (2.5 MG/3ML) 0.083% nebulizer solution, Take 3 mLs (2.5 mg total) by nebulization every 6 (six) hours as needed for wheezing or shortness of breath., Disp: 40 mL, Rfl: 0 .  amoxicillin-clavulanate (AUGMENTIN) 875-125 MG tablet, Take 1 tablet by mouth every 12 (twelve) hours. For sinusitis, Disp: 14 tablet, Rfl: 0 .  aspirin EC 81 MG tablet, Take 81 mg by mouth daily., Disp: , Rfl:  .  atorvastatin (LIPITOR) 10 MG tablet, Take 10 mg by mouth daily.  , Disp: , Rfl:  .  benazepril (LOTENSIN) 10 MG tablet, Take 10 mg by mouth daily.  , Disp: , Rfl:  .  ezetimibe (ZETIA) 10 MG tablet, Take  10 mg by mouth daily.  , Disp: , Rfl:  .  Insulin Glargine (TOUJEO MAX SOLOSTAR ), , Disp: , Rfl:  .  Liraglutide (VICTOZA) 18 MG/3ML SOLN, Inject 1.8 mg into the skin daily. , Disp: , Rfl:  .  loratadine-pseudoephedrine (CLARITIN-D 12-HOUR) 5-120 MG per tablet, Take 1 tablet by mouth daily., Disp: , Rfl:  .  metFORMIN (GLUCOPHAGE) 500 MG tablet, Take 1,000-1,500 mg by mouth See admin instructions. Take 1000 mg by mouth in the morning and 1500 mg by mouth at bedtime, Disp: , Rfl:  .  naproxen sodium (ALEVE) 220 MG tablet, Take 220 mg by mouth daily., Disp: , Rfl:  .  pioglitazone (ACTOS) 45 MG tablet, Take 45 mg by mouth daily. , Disp: , Rfl:  .  SYNTHROID 75 MCG tablet, Take 75 mcg by mouth daily before breakfast. , Disp: , Rfl: 12  EXAM:  VITALS per patient if applicable:  GENERAL: alert, oriented, appears well and in no acute distress  HEENT: atraumatic, conjunctiva clear, no obvious abnormalities on inspection of external nose and ears  NECK: normal movements of the head and neck  LUNGS: on inspection no signs of respiratory distress, breathing rate appears normal, no obvious gross SOB, gasping or wheezing  CV: no obvious cyanosis  MS: moves all visible extremities without noticeable abnormality  PSYCH/NEURO: pleasant and cooperative, no obvious depression or anxiety, speech and thought processing grossly intact  ASSESSMENT AND PLAN:  Discussed the following assessment and plan:  Suspected COVID-19 virus infection  -likely in window period/had low viral load at time of first test 12/10. -Agree with testing.  Pt currently in line for drive up COVID testing. -discussed supportive care -pt and family advised to self quarantine  -given precautions.   F/u prn  I discussed the assessment and treatment plan with the patient. The patient was provided an opportunity to ask questions and all were answered. The patient agreed with the plan and demonstrated an understanding of the  instructions.   The patient was advised to call back or seek an in-person evaluation if the symptoms worsen or if the condition fails to improve as anticipated.   Billie Ruddy, MD

## 2019-07-21 LAB — NOVEL CORONAVIRUS, NAA: SARS-CoV-2, NAA: DETECTED — AB

## 2019-07-22 ENCOUNTER — Telehealth: Payer: Self-pay | Admitting: Nurse Practitioner

## 2019-07-22 ENCOUNTER — Encounter: Payer: Self-pay | Admitting: Family Medicine

## 2019-07-22 ENCOUNTER — Other Ambulatory Visit: Payer: Self-pay

## 2019-07-22 MED ORDER — ALBUTEROL SULFATE (2.5 MG/3ML) 0.083% IN NEBU: 2.5000 mg | INHALATION_SOLUTION | Freq: Four times a day (QID) | RESPIRATORY_TRACT | 0 refills | Status: DC | PRN

## 2019-07-22 MED ORDER — ALBUTEROL SULFATE HFA 108 (90 BASE) MCG/ACT IN AERS: 2.0000 | INHALATION_SPRAY | Freq: Four times a day (QID) | RESPIRATORY_TRACT | 0 refills | Status: DC | PRN
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
## 2019-07-23 ENCOUNTER — Telehealth: Payer: Self-pay | Admitting: Family Medicine

## 2019-07-23 NOTE — Telephone Encounter (Signed)
Patient is requesting dr.banks to send him a new mask for medication albuterol (VENTOLIN HFA) 108 (90 Base) MCG/ACT inhaler  Sent [facility] is out of town.  Patient states mask is broken and would like [facility] to send a new mask. [facility] supply Fax [phone]  Patient call back [phone]

## 2019-07-23 NOTE — Telephone Encounter (Signed)
Message Routed to PCP CMA 

## 2019-07-27 NOTE — Telephone Encounter (Signed)
PCP out ok to sent

## 2019-07-28 NOTE — Telephone Encounter (Signed)
Noted  

## 2019-07-28 NOTE — Telephone Encounter (Signed)
Spoke with pt states that he does not recall where he got the mask and the tubing that is currently broke, pt was advised to pick up the tubing from the office, states that he will have the daughter pickup from the office.

## 2019-07-28 NOTE — Telephone Encounter (Signed)
RX sent on 07/22/19 by pt's provider.

## 2019-07-29 NOTE — Telephone Encounter (Signed)
Done

## 2019-07-29 NOTE — Telephone Encounter (Signed)
Pt daughter picked up the tubing and the mask from the office on 07/28/2019

## 2019-08-01 ENCOUNTER — Other Ambulatory Visit: Payer: Self-pay

## 2019-08-01 ENCOUNTER — Other Ambulatory Visit: Payer: Self-pay | Admitting: Internal Medicine

## 2019-08-07 HISTORY — PX: ROTATOR CUFF REPAIR: SHX139

## 2019-08-25 LAB — COMPREHENSIVE METABOLIC PANEL
Albumin: 4.6 (ref 3.5–5.0)
Calcium: 9.7 (ref 8.7–10.7)
GFR calc non Af Amer: 90

## 2019-08-25 LAB — HEPATIC FUNCTION PANEL
ALT: 25 (ref 10–40)
AST: 21 (ref 14–40)
Alkaline Phosphatase: 69 (ref 25–125)
Bilirubin, Total: 0.8

## 2019-08-25 LAB — LIPID PANEL
Cholesterol: 108 (ref 0–200)
HDL: 30 — AB (ref 35–70)
LDL Cholesterol: 67
Triglycerides: 91 (ref 40–160)

## 2019-08-25 LAB — HEMOGLOBIN A1C: Hemoglobin A1C: 6.8

## 2019-08-25 LAB — BASIC METABOLIC PANEL
BUN: 24 — AB (ref 4–21)
CO2: 25 — AB (ref 13–22)
Chloride: 102 (ref 99–108)
Creatinine: 0.9 (ref 0.6–1.3)
Glucose: 120
Potassium: 4.5 (ref 3.4–5.3)
Sodium: 138 (ref 137–147)

## 2019-10-10 ENCOUNTER — Encounter: Payer: Self-pay | Admitting: Internal Medicine

## 2019-10-23 ENCOUNTER — Ambulatory Visit: Payer: 59 | Attending: Internal Medicine

## 2019-10-23 DIAGNOSIS — Z23 Encounter for immunization: Secondary | ICD-10-CM

## 2019-10-23 NOTE — Progress Notes (Signed)
   Covid-19 Vaccination Clinic  Name:  Rickey Wright    MRN: [id number] DOB: [birth year]  10/23/2019  [name] was observed post Covid-19 immunization for 15 minutes without incident. He was provided with Vaccine Information Sheet and instruction to access the V-Safe system.   [name] was instructed to call 911 with any severe reactions post vaccine: [name] Difficulty breathing  . Swelling of face and throat  . A fast heartbeat  . A bad rash all over body  . Dizziness and weakness   Immunizations Administered    Name Date Dose VIS Date Route   Pfizer COVID-19 Vaccine 10/23/2019  9:31 AM 0.3 mL 07/17/2019 Intramuscular   Manufacturer: [place]   Lot: [id number]   [place]: [id number]

## 2019-11-17 ENCOUNTER — Ambulatory Visit: Payer: 59 | Attending: Internal Medicine

## 2019-11-17 DIAGNOSIS — Z23 Encounter for immunization: Secondary | ICD-10-CM

## 2019-11-17 NOTE — Progress Notes (Signed)
   Covid-19 Vaccination Clinic  Name:  Rickey Wright    MRN: [id number] DOB: 08-10-1960  11/17/2019  Mr. Marks was observed post Covid-19 immunization for 15 minutes without incident. He was provided with Vaccine Information Sheet and instruction to access the V-Safe system.   [name] was instructed to call 911 with any severe reactions post vaccine: [name] Difficulty breathing  . Swelling of face and throat  . A fast heartbeat  . A bad rash all over body  . Dizziness and weakness   Immunizations Administered    Name Date Dose VIS Date Route   Pfizer COVID-19 Vaccine 11/17/2019 10:11 AM 0.3 mL 07/17/2019 Intramuscular   Manufacturer: [place]   Lot: [id number]   [place]: [id number]

## 2019-11-23 LAB — BASIC METABOLIC PANEL
BUN: 21 (ref 4–21)
CO2: 24 — AB (ref 13–22)
Chloride: 101 (ref 99–108)
Creatinine: 0.9 (ref <=1.3)
Glucose: 104
Potassium: 4.2 (ref 3.4–5.3)
Sodium: 136 — AB (ref 137–147)

## 2019-11-23 LAB — HEPATIC FUNCTION PANEL
ALT: 39 (ref 10–40)
AST: 32 (ref 14–40)
Alkaline Phosphatase: 63 (ref 25–125)
Bilirubin, Total: 0.7

## 2019-11-23 LAB — LIPID PANEL
Cholesterol: 101 (ref 0–200)
HDL: 29 — AB (ref 35–70)
LDL Cholesterol: 62
Triglycerides: 96 (ref 40–160)

## 2019-11-23 LAB — HEMOGLOBIN A1C: Hemoglobin A1C: 6.7

## 2019-11-23 LAB — TSH: TSH: 2.76 (ref <=5.90)

## 2019-11-23 LAB — COMPREHENSIVE METABOLIC PANEL
Albumin: 4.2 (ref 3.5–5.0)
Calcium: 9.3 (ref 8.7–10.7)

## 2019-12-15 IMAGING — CR DG CHEST 2V
2 series · 2 of 2 positions shown · non-contrast
Comparison: Radiographs October 01, 2016.

CLINICAL DATA: Chest pain.

EXAM:
CHEST - 2 VIEW

[w chest pa]
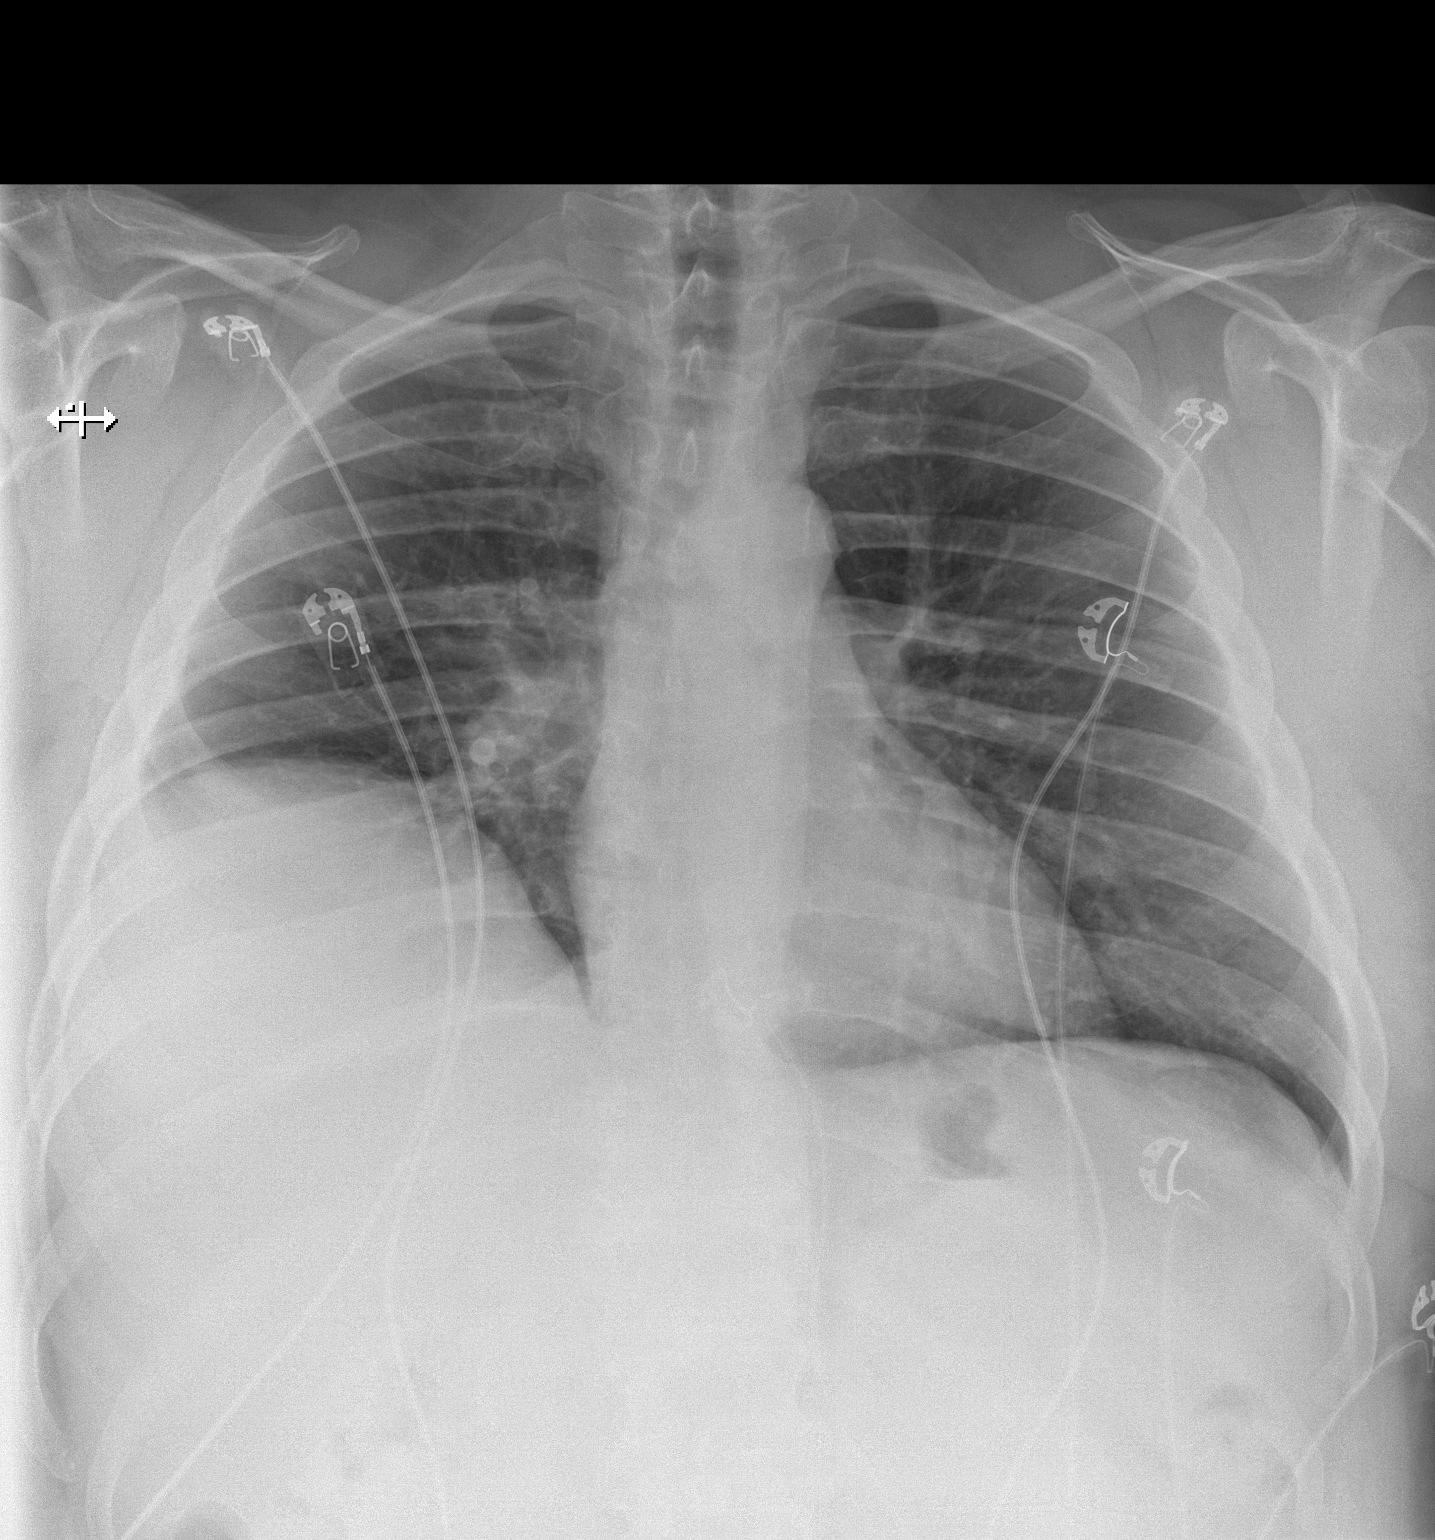

[w chest lat]
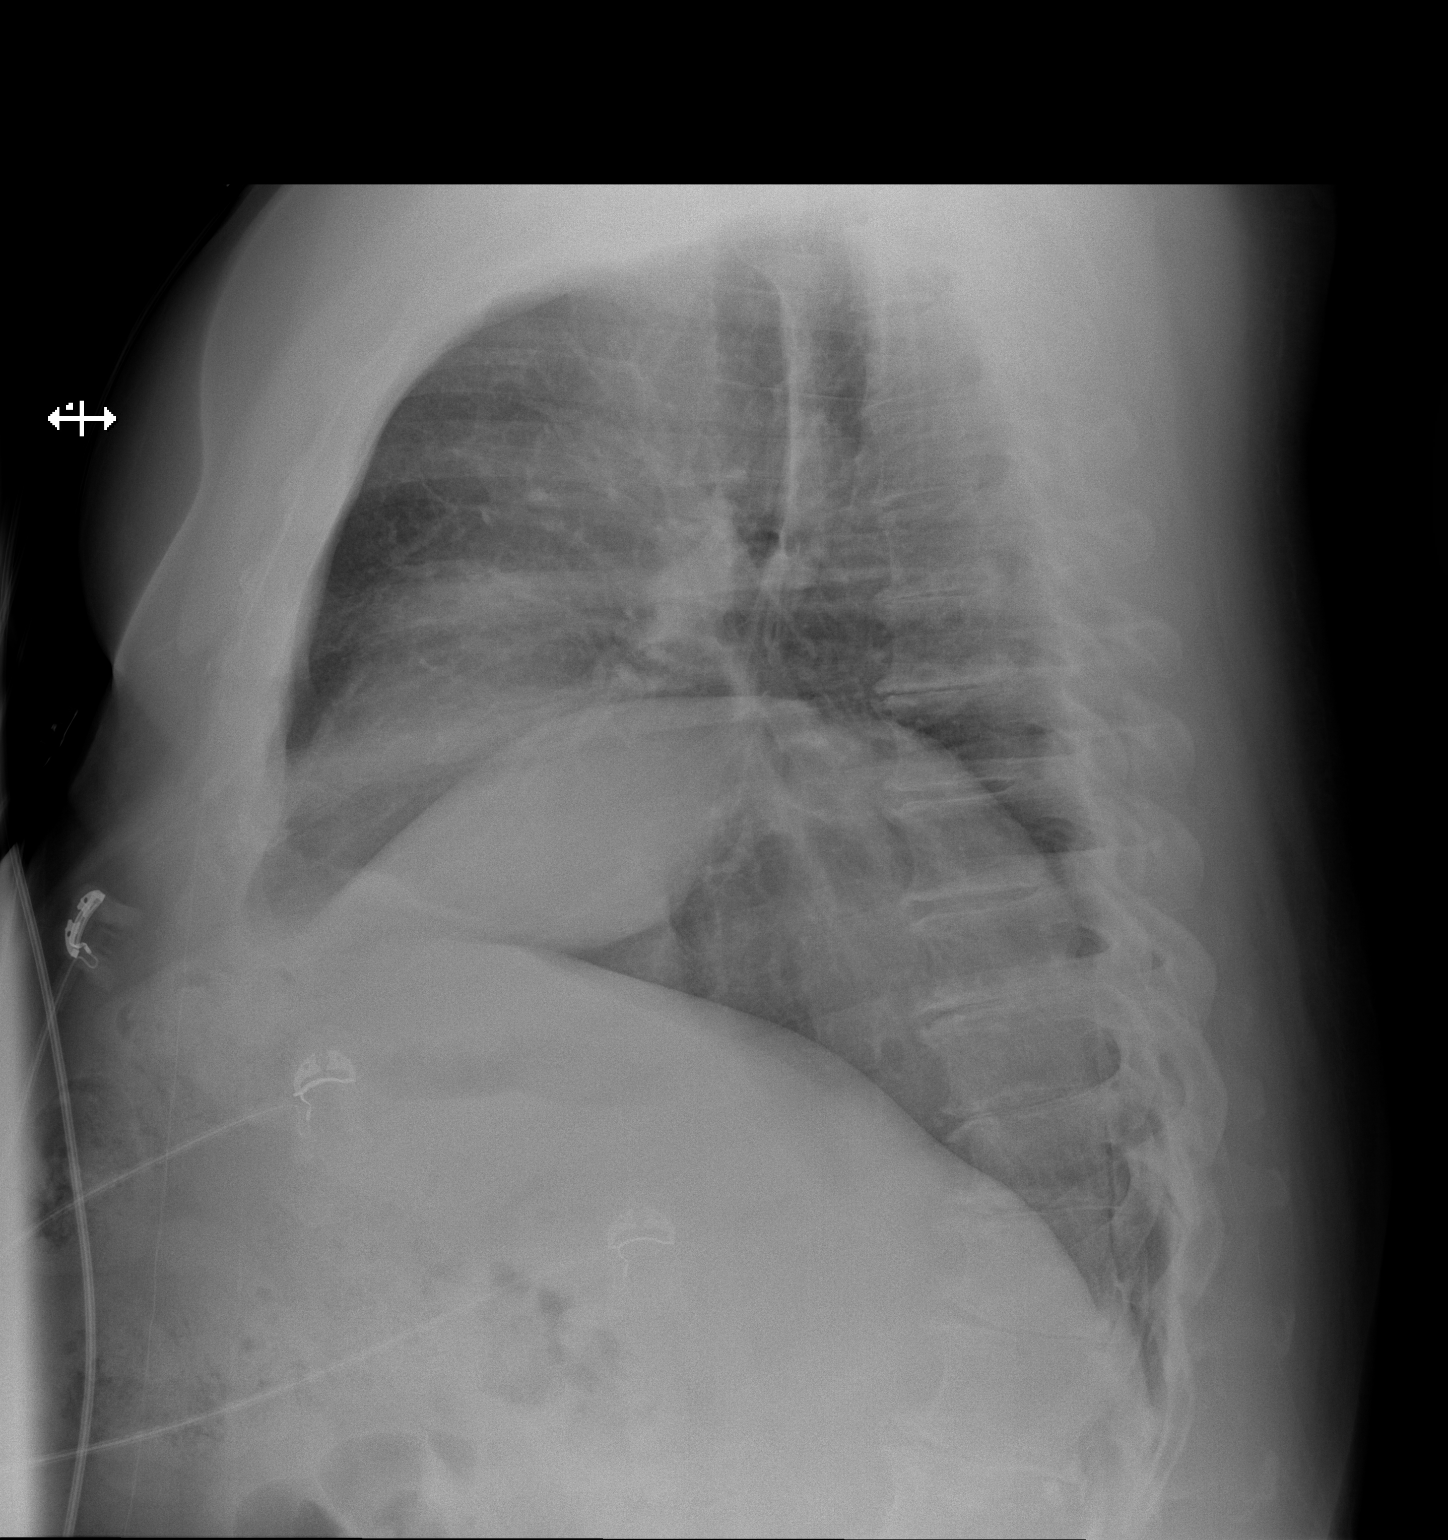

[2 of 2 positions shown; findings below may reference images not displayed]

FINDINGS: The heart size and mediastinal contours are within normal limits.
Both lungs are clear. No pneumothorax or pleural effusion is noted.
Stable elevated right hemidiaphragm is noted. The visualized
skeletal structures are unremarkable.
IMPRESSION: No active cardiopulmonary disease.

## 2019-12-24 ENCOUNTER — Other Ambulatory Visit (HOSPITAL_COMMUNITY): Payer: Self-pay | Admitting: Orthopedic Surgery

## 2019-12-24 DIAGNOSIS — M75102 Unspecified rotator cuff tear or rupture of left shoulder, not specified as traumatic: Secondary | ICD-10-CM

## 2019-12-26 ENCOUNTER — Ambulatory Visit (HOSPITAL_COMMUNITY)
Admission: RE | Admit: 2019-12-26 | Discharge: 2019-12-26 | Disposition: A | Discharge status: Home / Self Care | Payer: 59 | Source: Ambulatory Visit | Attending: Internal Medicine | Admitting: Internal Medicine

## 2019-12-26 ENCOUNTER — Other Ambulatory Visit: Payer: Self-pay

## 2019-12-26 DIAGNOSIS — M75102 Unspecified rotator cuff tear or rupture of left shoulder, not specified as traumatic: Secondary | ICD-10-CM | POA: Diagnosis not present

## 2020-01-09 ENCOUNTER — Ambulatory Visit (HOSPITAL_COMMUNITY): Admission: RE | Admit: 2020-01-09 | Payer: 59 | Source: Ambulatory Visit

## 2020-01-09 ENCOUNTER — Encounter (HOSPITAL_COMMUNITY): Payer: Self-pay

## 2020-03-29 NOTE — Progress Notes (Signed)
Chief Complaint  Patient presents with  . Rash    Ongoing rash, all over the body, itching, started after shoulder surgery    HPI: Rickey Wright 59 y.o. come in for 2 weeks of  Itchy areas and then rash    calritin d helps short term. But flares up .    Wife has a rash  Undefined  Per derm .   Benadryl at night .   All over    Was rx for a groin rash cellulutis? post op .  vs  Other antibiotic  For 10 days  And  keflex and finished  Late July   Lotrimin and helped and  Then . Itching later   Bumps and   Whelps when scratches  Ankles  Upper trunk and back right some pain digs at this . No resp sx  Claritin d helps some but not all day and took some behandry and topical  No new  meds other  Not sure waht this is  No resp sx  Eats red meat doesn't correlate  w this   Had full labs in July per endo and ok renal  lfts  Thyroid  No current narcotics  ROS: See pertinent positives and negatives per HPI. No sob cough remote hx of covid   In pt for shoulder rehab   Past Medical History:  Diagnosis Date  . ADD (attention deficit disorder)   . Allergy   . Anemia    hx iron deficiency   . Arthritis    "knees, shoulders, wrists, ankles, back" (06/22/2014)  . Asthma    "sports induced only"  . DDD (degenerative disc disease), lumbosacral   . Depression   . Diabetes mellitus, type 2 ([place])   . Hyperlipidemia   . Hypertension   . Hypothyroidism   . Localized osteoarthritis of left knee 06/27/2012  . Loose body of left knee 06/27/2012  . OSA (obstructive sleep apnea)    mild not on cpap  . Primary localized osteoarthritis of right knee 06/22/2014    Family History  Problem Relation Age of Onset  . Colon polyps Mother   . Heart disease Father        49s   . Aneurysm Father        AAA smoker  . Seizures Daughter   . Thyroid disease Daughter   . Cancer Maternal Grandfather        colon  . Diabetes Paternal Grandmother   . ADD / ADHD Son   . Learning disabilities Son      Social History   Socioeconomic History  . Marital status: Married    Spouse name: Not on file  . Number of children: Not on file  . Years of education: Not on file  . Highest education level: Not on file  Occupational History  . Not on file  Tobacco Use  . Smoking status: Never Smoker  . Smokeless tobacco: Never Used  Vaping Use  . Vaping Use: Never used  Substance and Sexual Activity  . Alcohol use: Yes    Comment: very rarely, maybe 2 a yr..  . Drug use: No  . Sexual activity: Yes  Other Topics Concern  . Not on file  Social History Narrative   5 children  Married    Education officer, [employer] with work   Non smoker    H H of 7 pet dog   Social Determinants of Health   Financial Resource Strain:   . Difficulty  of Paying Living Expenses: Not on file  Food Insecurity:   . Worried About Charity fundraiser in the Last Year: Not on file  . Ran Out of Food in the Last Year: Not on file  Transportation Needs:   . Lack of Transportation (Medical): Not on file  . Lack of Transportation (Non-Medical): Not on file  Physical Activity:   . Days of Exercise per Week: Not on file  . Minutes of Exercise per Session: Not on file  Stress:   . Feeling of Stress : Not on file  Social Connections:   . Frequency of Communication with Friends and Family: Not on file  . Frequency of Social Gatherings with Friends and Family: Not on file  . Attends Religious Services: Not on file  . Active Member of Clubs or Organizations: Not on file  . Attends Archivist Meetings: Not on file  . Marital Status: Not on file    Outpatient Medications Prior to Visit  Medication Sig Dispense Refill  . aspirin EC 81 MG tablet Take 81 mg by mouth daily.    [name] atorvastatin (LIPITOR) 10 MG tablet Take 10 mg by mouth daily.      . benazepril (LOTENSIN) 10 MG tablet Take 10 mg by mouth daily.      . clotrimazole (LOTRIMIN) 1 % cream Please apply to skin twice as day for the next 7 days or as needed    .  Continuous Blood Gluc Receiver (FREESTYLE LIBRE 14 DAY READER) DEVI See admin instructions.    . Continuous Blood Gluc Sensor (FREESTYLE LIBRE 14 DAY SENSOR) MISC Apply topically as directed.    . ezetimibe (ZETIA) 10 MG tablet Take 10 mg by mouth daily.      [name] icosapent Ethyl (VASCEPA) 1 g capsule Take by mouth.    . Insulin Glargine (TOUJEO MAX SOLOSTAR [place])     . Insulin Pen Needle (NOVOTWIST) 32G X 5 MM MISC USE FOR INJECTIONS TWICE A DAY    . JARDIANCE 25 MG TABS tablet Take 25 mg by mouth every morning.    . Liraglutide (VICTOZA) 18 MG/3ML SOLN Inject 1.8 mg into the skin daily.     [name] loratadine-pseudoephedrine (CLARITIN-D 12-HOUR) 5-120 MG per tablet Take 1 tablet by mouth daily.    . meloxicam (MOBIC) 15 MG tablet Take 15 mg by mouth daily.    . metFORMIN (GLUCOPHAGE) 500 MG tablet Take 1,000-1,500 mg by mouth See admin instructions. Take 1000 mg by mouth in the morning and 1500 mg by mouth at bedtime    . pioglitazone (ACTOS) 45 MG tablet Take 45 mg by mouth daily.     [name] SYNTHROID 75 MCG tablet Take 75 mcg by mouth daily before breakfast.   12  . VALIUM 5 MG tablet Take 2 tab by mouth one hour prior to MRI repeat if necessary, driver needed    . VASCEPA 1 g capsule Take 2 g by mouth 3 (three) times daily.    [name] albuterol (PROVENTIL) (2.5 MG/3ML) 0.083% nebulizer solution USE 1 VIAL IN NEBULIZER EVERY 6 HOURS AS NEEDED FOR WHEEZING/SHORTNESS OF BREATH (Patient not taking: Reported on 03/30/2020) 75 mL 0  . albuterol (VENTOLIN HFA) 108 (90 Base) MCG/ACT inhaler Inhale 2 puffs into the lungs every 6 (six) hours as needed for wheezing or shortness of breath. (Patient not taking: Reported on 03/30/2020) 16 g 0  . amoxicillin-clavulanate (AUGMENTIN) 875-125 MG tablet Take 1 tablet by mouth every 12 (twelve) hours. For  sinusitis 14 tablet 0  . naproxen sodium (ALEVE) 220 MG tablet Take 220 mg by mouth daily.     No facility-administered medications prior to visit.     EXAM:  BP 120/64    Pulse 87   Temp 98.6 F (37 C) (Oral)   Ht 5\' 10"  (1.778 m)   Wt 212 lb 3.2 oz (96.3 kg)   SpO2 97%   BMI 30.45 kg/m   Body mass index is 30.45 kg/m. Wt Readings from Last 3 Encounters:  03/30/20 212 lb 3.2 oz (96.3 kg)  08/25/18 247 lb 4.8 oz (112.2 kg)  08/12/18 248 lb 9.6 oz (112.8 kg)    GENERAL: vitals reviewed and listed above, alert, oriented, appears well hydrated and in no acute distress HEENT: atraumatic, conjunctiva  clear, no obvious abnormalities on inspection of external nose and ears OP : NECK: no obvious masses on inspection palpation  LUNGS: clear to auscultation bilaterally, no wheezes, rales or rhonchi, good air movement CV: HRRR, no clubbing cyanosis or  peripheral edema nl cap refill  MS: moves all extremities without noticeable focal  Abnormality Skin  Pix of dermatographia   Arm ans scratch makrd  Some bumps r  Back excoriated no besicle or pustule    No crops  Ankle otching without rash  Face no edema  No burrows obvious PSYCH: pleasant and cooperative, no obvious depression or anxiety Lab Results  Component Value Date   WBC 8.9 07/19/2018   HGB 14.5 07/19/2018   HCT 46.1 07/19/2018   PLT 279 07/19/2018   GLUCOSE 299 (H) 07/19/2018   CHOL 101 11/23/2019   TRIG 96 11/23/2019   HDL 29 (A) 11/23/2019   LDLCALC 62 11/23/2019   ALT 39 11/23/2019   AST 32 11/23/2019   NA 136 (A) 11/23/2019   K 4.2 11/23/2019   CL 101 11/23/2019   CREATININE 0.9 11/23/2019   BUN 21 11/23/2019   CO2 24 (A) 11/23/2019   TSH 2.76 11/23/2019   PSA 0.11 12/19/2010   HGBA1C 6.7 11/23/2019   BP Readings from Last 3 Encounters:  03/30/20 120/64  08/25/18 112/64  08/12/18 (!) 142/86    ASSESSMENT AND PLAN:  Discussed the following assessment and plan:  Dermatographic urticaria  Itching  History of COVID-19  Type 2 diabetes mellitus with hyperglycemia, without long-term current use of insulin (HCC) Uncertain cause   Post rx with antibiotic keflex but not on med  now   Advise round the clock antihistamine   Hesitant to add  Steroid at this time  Neg more than positive seemingly  Consider  Derm allergy  eval if  persistent or progressive    mauy be this was triggered by whatever infection he had seem but  UC   Doubt related to  Past hx of covid  -Patient advised to return or notify health care team  if  new concerns arise. 32 minutes record review  And  Data review  Patient Instructions  Take xyzal or zyrtec every day  And can take extra  Or add on benadryl until  Calms down    Cannot say trigger   Except    Can be triggered by infection heat other  meds   . sometimes we never find out  And it fortunately remits .  Heat pressure and severe cold   Can sometime triggers   . This   You have dermatographia   .   Consider steroid if needed but risk of  High BG  and may not be  Curative.     Hives Hives (urticaria) are itchy, red, swollen areas on the skin. Hives can appear on any part of the body. Hives often fade within 24 hours (acute hives). Sometimes, new hives appear after old ones fade and the cycle can continue for several days or weeks (chronic hives). Hives do not spread from person to person (are not contagious). Hives come from the body's reaction to something a person is allergic to (allergen), something that causes irritation, or various other triggers. When a person is exposed to a trigger, his or her body releases a chemical (histamine) that causes redness, itching, and swelling. Hives can appear right after exposure to a trigger or hours later. What are the causes? This condition may be caused by:  Allergies to foods or ingredients.  Insect bites or stings.  Exposure to pollen or pets.  Contact with latex or chemicals.  Spending time in sunlight, heat, or cold (exposure).  Exercise.  Stress.  Certain medicines. You can also get hives from other medical conditions and treatments, such as:  Viruses, including the common  cold.  Bacterial infections, such as urinary tract infections and strep throat.  Certain medicines.  Allergy shots.  Blood transfusions. Sometimes, the cause of this condition is not known (idiopathic hives). What increases the risk? You are more likely to develop this condition if you:  Are a woman.  Have food allergies, especially to citrus fruits, milk, eggs, peanuts, tree nuts, or shellfish.  Are allergic to: ? Medicines. ? Latex. ? Insects. ? Animals. ? Pollen. What are the signs or symptoms? Common symptoms of this condition include raised, itchy, red or white bumps or patches on your skin. These areas may:  Become large and swollen (welts).  Change in shape and location, quickly and repeatedly.  Be separate hives or connect over a large area of skin.  Sting or become painful.  Turn white when pressed in the center (blanch). In severe cases, yourhands, feet, and face may also become swollen. This may occur if hives develop deeper in your skin. How is this diagnosed? This condition may be diagnosed by your symptoms, medical history, and physical exam.  Your skin, urine, or blood may be tested to find out what is causing your hives and to rule out other health issues.  Your health care provider may also remove a small sample of skin from the affected area and examine it under a microscope (biopsy). How is this treated? Treatment for this condition depends on the cause and severity of your symptoms. Your health care provider may recommend using cool, wet cloths (cool compresses) or taking cool showers to relieve itching. Treatment may include:  Medicines that help: ? Relieve itching (antihistamines). ? Reduce swelling (corticosteroids). ? Treat infection (antibiotics).  An injectable medicine (omalizumab). Your health care provider may prescribe this if you have chronic idiopathic hives and you continue to have symptoms even after treatment with  antihistamines. Severe cases may require an emergency injection of adrenaline (epinephrine) to prevent a life-threatening allergic reaction (anaphylaxis). Follow these instructions at home: Medicines  Take and apply over-the-counter and prescription medicines only as told by your health care provider.  If you were prescribed an antibiotic medicine, take it as told by your health care provider. Do not stop using the antibiotic even if you start to feel better. Skin care  Apply cool compresses to the affected areas.  Do not scratch or rub your skin. General instructions  Do not take hot showers or baths. This can make itching worse.  Do not wear tight-fitting clothing.  Use sunscreen and wear protective clothing when you are outside.  Avoid any substances that cause your hives. Keep a journal to help track what causes your hives. Write down: ? What medicines you take. ? What you eat and drink. ? What products you use on your skin.  Keep all follow-up visits as told by your health care provider. This is important. Contact a health care provider if:  Your symptoms are not controlled with medicine.  Your joints are painful or swollen. Get help right away if:  You have a fever.  You have pain in your abdomen.  Your tongue or lips are swollen.  Your eyelids are swollen.  Your chest or throat feels tight.  You have trouble breathing or swallowing. These symptoms may represent a serious problem that is an emergency. Do not wait to see if the symptoms will go away. Get medical help right away. Call your local emergency services (911 in the [place] Do not drive yourself to the hospital. Summary  Hives (urticaria) are itchy, red, swollen areas on your skin. Hives come from the body's reaction to something a person is allergic to (allergen), something that causes irritation, or various other triggers.  Treatment for this condition depends on the cause and severity of your  symptoms.  Avoid any substances that cause your hives. Keep a journal to help track what causes your hives.  Take and apply over-the-counter and prescription medicines only as told by your health care provider.  Keep all follow-up visits as told by your health care provider. This is important. This information is not intended to replace advice given to you by your health care provider. Make sure you discuss any questions you have with your health care provider. Document Revised: 02/05/2018 Document Reviewed: 02/05/2018 Elsevier Patient Education  2020 [place] Feldpausch M.D.

## 2020-03-30 ENCOUNTER — Ambulatory Visit (INDEPENDENT_AMBULATORY_CARE_PROVIDER_SITE_OTHER): Payer: 59 | Admitting: Internal Medicine

## 2020-03-30 ENCOUNTER — Encounter: Payer: Self-pay | Admitting: Internal Medicine

## 2020-03-30 ENCOUNTER — Other Ambulatory Visit: Payer: Self-pay

## 2020-03-30 VITALS — BP 120/64 | HR 87 | Temp 98.6°F | Ht 70.0 in | Wt 212.2 lb

## 2020-03-30 DIAGNOSIS — Z8616 Personal history of COVID-19: Secondary | ICD-10-CM | POA: Diagnosis not present

## 2020-03-30 DIAGNOSIS — E1165 Type 2 diabetes mellitus with hyperglycemia: Secondary | ICD-10-CM | POA: Diagnosis not present

## 2020-03-30 DIAGNOSIS — L503 Dermatographic urticaria: Secondary | ICD-10-CM

## 2020-03-30 DIAGNOSIS — L299 Pruritus, unspecified: Secondary | ICD-10-CM | POA: Diagnosis not present

## 2020-03-30 NOTE — Patient Instructions (Signed)
Take xyzal or zyrtec every day  And can take extra  Or add on benadryl until  Calms down    Cannot say trigger   Except    Can be triggered by infection heat other  meds   . sometimes we never find out  And it fortunately remits .  Heat pressure and severe cold   Can sometime triggers   . This   You have dermatographia   .   Consider steroid if needed but risk of  High BG and may not be  Curative.     Hives Hives (urticaria) are itchy, red, swollen areas on the skin. Hives can appear on any part of the body. Hives often fade within 24 hours (acute hives). Sometimes, new hives appear after old ones fade and the cycle can continue for several days or weeks (chronic hives). Hives do not spread from person to person (are not contagious). Hives come from the body's reaction to something a person is allergic to (allergen), something that causes irritation, or various other triggers. When a person is exposed to a trigger, his or her body releases a chemical (histamine) that causes redness, itching, and swelling. Hives can appear right after exposure to a trigger or hours later. What are the causes? This condition may be caused by:  Allergies to foods or ingredients.  Insect bites or stings.  Exposure to pollen or pets.  Contact with latex or chemicals.  Spending time in sunlight, heat, or cold (exposure).  Exercise.  Stress.  Certain medicines. You can also get hives from other medical conditions and treatments, such as:  Viruses, including the common cold.  Bacterial infections, such as urinary tract infections and strep throat.  Certain medicines.  Allergy shots.  Blood transfusions. Sometimes, the cause of this condition is not known (idiopathic hives). What increases the risk? You are more likely to develop this condition if you:  Are a woman.  Have food allergies, especially to citrus fruits, milk, eggs, peanuts, tree nuts, or shellfish.  Are allergic  to: ? Medicines. ? Latex. ? Insects. ? Animals. ? Pollen. What are the signs or symptoms? Common symptoms of this condition include raised, itchy, red or white bumps or patches on your skin. These areas may:  Become large and swollen (welts).  Change in shape and location, quickly and repeatedly.  Be separate hives or connect over a large area of skin.  Sting or become painful.  Turn white when pressed in the center (blanch). In severe cases, yourhands, feet, and face may also become swollen. This may occur if hives develop deeper in your skin. How is this diagnosed? This condition may be diagnosed by your symptoms, medical history, and physical exam.  Your skin, urine, or blood may be tested to find out what is causing your hives and to rule out other health issues.  Your health care provider may also remove a small sample of skin from the affected area and examine it under a microscope (biopsy). How is this treated? Treatment for this condition depends on the cause and severity of your symptoms. Your health care provider may recommend using cool, wet cloths (cool compresses) or taking cool showers to relieve itching. Treatment may include:  Medicines that help: ? Relieve itching (antihistamines). ? Reduce swelling (corticosteroids). ? Treat infection (antibiotics).  An injectable medicine (omalizumab). Your health care provider may prescribe this if you have chronic idiopathic hives and you continue to have symptoms even after treatment with antihistamines. Severe  cases may require an emergency injection of adrenaline (epinephrine) to prevent a life-threatening allergic reaction (anaphylaxis). Follow these instructions at home: Medicines  Take and apply over-the-counter and prescription medicines only as told by your health care provider.  If you were prescribed an antibiotic medicine, take it as told by your health care provider. Do not stop using the antibiotic even if  you start to feel better. Skin care  Apply cool compresses to the affected areas.  Do not scratch or rub your skin. General instructions  Do not take hot showers or baths. This can make itching worse.  Do not wear tight-fitting clothing.  Use sunscreen and wear protective clothing when you are outside.  Avoid any substances that cause your hives. Keep a journal to help track what causes your hives. Write down: ? What medicines you take. ? What you eat and drink. ? What products you use on your skin.  Keep all follow-up visits as told by your health care provider. This is important. Contact a health care provider if:  Your symptoms are not controlled with medicine.  Your joints are painful or swollen. Get help right away if:  You have a fever.  You have pain in your abdomen.  Your tongue or lips are swollen.  Your eyelids are swollen.  Your chest or throat feels tight.  You have trouble breathing or swallowing. These symptoms may represent a serious problem that is an emergency. Do not wait to see if the symptoms will go away. Get medical help right away. Call your local emergency services (911 in the [place] Do not drive yourself to the hospital. Summary  Hives (urticaria) are itchy, red, swollen areas on your skin. Hives come from the body's reaction to something a person is allergic to (allergen), something that causes irritation, or various other triggers.  Treatment for this condition depends on the cause and severity of your symptoms.  Avoid any substances that cause your hives. Keep a journal to help track what causes your hives.  Take and apply over-the-counter and prescription medicines only as told by your health care provider.  Keep all follow-up visits as told by your health care provider. This is important. This information is not intended to replace advice given to you by your health care provider. Make sure you discuss any questions you have with your  health care provider. Document Revised: 02/05/2018 Document Reviewed: 02/05/2018 Elsevier Patient Education  [place].

## 2020-05-24 ENCOUNTER — Telehealth: Payer: Self-pay | Admitting: Internal Medicine

## 2020-05-24 NOTE — Telephone Encounter (Signed)
The patient is needing this cream sent to pharmacy for permethrin (ELIMITE) 5 % cream   [id number] - [place], [place] [address] [place] Phone:  [phone]  Fax:  [phone]     Rickey Wright was diagnosis with scabies and everyone in the house has been exposed.

## 2020-05-27 MED ORDER — PERMETHRIN 5 % EX CREA: TOPICAL_CREAM | CUTANEOUS | 0 refills | Status: DC

## 2020-05-27 NOTE — Addendum Note (Signed)
Addended byShanon Ace K on: 05/27/2020 05:05 PM   Modules accepted: Orders

## 2020-05-27 NOTE — Telephone Encounter (Signed)
rx sent in I am aware of  Situation

## 2020-07-13 ENCOUNTER — Telehealth: Payer: Self-pay | Admitting: Internal Medicine

## 2020-07-13 ENCOUNTER — Other Ambulatory Visit: Payer: Self-pay

## 2020-07-13 ENCOUNTER — Emergency Department (HOSPITAL_COMMUNITY)
Admission: EM | Admit: 2020-07-13 | Discharge: 2020-07-13 | Disposition: A | Discharge status: Home / Self Care | Payer: 59 | Attending: Emergency Medicine | Admitting: Emergency Medicine

## 2020-07-13 DIAGNOSIS — G510 Bell's palsy: Secondary | ICD-10-CM | POA: Insufficient documentation

## 2020-07-13 DIAGNOSIS — I10 Essential (primary) hypertension: Secondary | ICD-10-CM | POA: Insufficient documentation

## 2020-07-13 DIAGNOSIS — E119 Type 2 diabetes mellitus without complications: Secondary | ICD-10-CM | POA: Insufficient documentation

## 2020-07-13 DIAGNOSIS — Z96651 Presence of right artificial knee joint: Secondary | ICD-10-CM | POA: Insufficient documentation

## 2020-07-13 DIAGNOSIS — E039 Hypothyroidism, unspecified: Secondary | ICD-10-CM | POA: Insufficient documentation

## 2020-07-13 DIAGNOSIS — R29810 Facial weakness: Secondary | ICD-10-CM | POA: Diagnosis present

## 2020-07-13 MED ORDER — PREDNISONE 20 MG PO TABS: 60.0000 mg | ORAL_TABLET | Freq: Every day | ORAL | 0 refills | Status: AC

## 2020-07-13 MED ORDER — VALACYCLOVIR HCL 1 G PO TABS: 1000.0000 mg | ORAL_TABLET | Freq: Three times a day (TID) | ORAL | 0 refills | Status: AC

## 2020-07-13 NOTE — ED Triage Notes (Signed)
Pt here from home with c/o what he thinks is bells palsy , pt has left side facial drop and left eye is dry from being open , MD into see pt

## 2020-07-13 NOTE — Discharge Instructions (Addendum)
You were evaluated in the Emergency Department and after careful evaluation, we did not find any emergent condition requiring admission or further testing in the hospital.  Your exam/testing today was overall reassuring.  Your symptoms seem to be due to Bell's palsy.  Please take the prednisone and the valacyclovir as directed for the next week.  Use an eye patch at night to protect your eye.  Please return to the Emergency Department if you experience any worsening of your condition.  Thank you for allowing [place] to be a part of your care.

## 2020-07-13 NOTE — Telephone Encounter (Signed)
Patient called wanting to schedule appointment for symptoms of left facial numbness, drooping, trouble focusing left eye.Patient have history of Bell's Palsy. Patient was transferred to triage where advised to go to ED. Pt refused to go to ED and wanted to be seen in office. Patient was advise Dr Regis Bill was not in office and out appointment schedule is full and advise to go to ED or urgent care due to his symptoms.

## 2020-07-13 NOTE — ED Provider Notes (Signed)
[facility] Emergency Department Provider Note MRN:  [id number]  Arrival date & time: 07/13/20     Chief Complaint   Facial weakness History of Present Illness   Rickey Wright is a 59 y.o. year-old male with a history of diabetes presenting to the ED with chief complaint of facial weakness.  Patient began experiencing facial weakness on the left side yesterday evening, felt like he was coming down with Bell's palsy again.  He had Bell's palsy 20 years ago and prior to that he had Bell's palsy one other occasion.  Apart from left facial weakness he denies any complaints, no numbness or weakness to the arms or legs, no headache, no trouble speaking, no trouble swallowing, no chest pain or shortness of breath, no abdominal pain.  Review of Systems  A complete 10 system review of systems was obtained and all systems are negative except as noted in the HPI and PMH.   Patient's Health History    Past Medical History:  Diagnosis Date  . ADD (attention deficit disorder)   . Allergy   . Anemia    hx iron deficiency   . Arthritis    "knees, shoulders, wrists, ankles, back" (06/22/2014)  . Asthma    "sports induced only"  . DDD (degenerative disc disease), lumbosacral   . Depression   . Diabetes mellitus, type 2 ([place])   . Hyperlipidemia   . Hypertension   . Hypothyroidism   . Localized osteoarthritis of left knee 06/27/2012  . Loose body of left knee 06/27/2012  . OSA (obstructive sleep apnea)    mild not on cpap  . Primary localized osteoarthritis of right knee 06/22/2014    Past Surgical History:  Procedure Laterality Date  . HAND TENDON SURGERY Left ~ 2008 X 2  . JOINT REPLACEMENT    . KNEE ARTHROSCOPY Right 1981; 1993; ~ 2006  . KNEE ARTHROSCOPY  06/27/2012   Procedure: ARTHROSCOPY KNEE;  Surgeon: Johnny Bridge, MD;  Location: [facility];  Service: Orthopedics;  Laterality: Left;  left knee arthroscopy with removal loose foreign body  , with debridement/shaving( chondroplasty)  . RHINOPLASTY  2000's  . TOTAL KNEE ARTHROPLASTY Right 06/22/2014  . TOTAL KNEE ARTHROPLASTY Right 06/22/2014   Procedure: RIGHT TOTAL KNEE ARTHROPLASTY;  Surgeon: Johnny Bridge, MD;  Location: [facility];  Service: Orthopedics;  Laterality: Right;    Family History  Problem Relation Age of Onset  . Colon polyps Mother   . Heart disease Father        72s   . Aneurysm Father        AAA smoker  . Seizures Daughter   . Thyroid disease Daughter   . Cancer Maternal Grandfather        colon  . Diabetes Paternal Grandmother   . ADD / ADHD Son   . Learning disabilities Son     Social History   Socioeconomic History  . Marital status: Married    Spouse name: Not on file  . Number of children: Not on file  . Years of education: Not on file  . Highest education level: Not on file  Occupational History  . Not on file  Tobacco Use  . Smoking status: Never Smoker  . Smokeless tobacco: Never Used  Vaping Use  . Vaping Use: Never used  Substance and Sexual Activity  . Alcohol use: Yes    Comment: very rarely, maybe 2 a yr..  . Drug use: No  .  Sexual activity: Yes  Other Topics Concern  . Not on file  Social History Narrative   5 children  Married    Education officer, museum with work   Non smoker    H H of 7 pet dog   Social Determinants of Radio broadcast assistant Strain:   . Difficulty of Paying Living Expenses: Not on file  Food Insecurity:   . Worried About Charity fundraiser in the Last Year: Not on file  . Ran Out of Food in the Last Year: Not on file  Transportation Needs:   . Lack of Transportation (Medical): Not on file  . Lack of Transportation (Non-Medical): Not on file  Physical Activity:   . Days of Exercise per Week: Not on file  . Minutes of Exercise per Session: Not on file  Stress:   . Feeling of Stress : Not on file  Social Connections:   . Frequency of Communication with Friends and Family: Not on file  . Frequency of  Social Gatherings with Friends and Family: Not on file  . Attends Religious Services: Not on file  . Active Member of Clubs or Organizations: Not on file  . Attends Archivist Meetings: Not on file  . Marital Status: Not on file  Intimate Partner Violence:   . Fear of Current or Ex-Partner: Not on file  . Emotionally Abused: Not on file  . Physically Abused: Not on file  . Sexually Abused: Not on file     Physical Exam   Vitals:   07/13/20 1337  BP: 129/67  Pulse: 84  Resp: 15  Temp: 99 F (37.2 C)  SpO2: 96%    CONSTITUTIONAL: Well-appearing, NAD NEURO:  Alert and oriented x 3, left facial weakness involving the forehead and periorbital region, normal and symmetric strength and sensation of the arms and legs, normal gait, normal speech EYES:  eyes equal and reactive ENT/NECK:  no LAD, no JVD CARDIO: Regular rate, well-perfused, normal S1 and S2 PULM:  CTAB no wheezing or rhonchi GI/GU:  normal bowel sounds, non-distended, non-tender MSK/SPINE:  No gross deformities, no edema SKIN:  no rash, atraumatic PSYCH:  Appropriate speech and behavior  *Additional and/or pertinent findings included in MDM below  Diagnostic and Interventional Summary    EKG Interpretation  Date/Time:    Ventricular Rate:    PR Interval:    QRS Duration:   QT Interval:    QTC Calculation:   R Axis:     Text Interpretation:        Labs Reviewed - No data to display  No orders to display    Medications - No data to display   Procedures  /  Critical Care Procedures  ED Course and Medical Decision Making  I have reviewed the triage vital signs, the nursing notes, and pertinent available records from the EMR.  Listed above are laboratory and imaging tests that I personally ordered, reviewed, and interpreted and then considered in my medical decision making (see below for details).  Consistent with Bell's palsy, nothing to suggest CNS process.  Appropriate for discharge.   Patient will keep a close eye on his blood sugars, we discussed the pros and cons of prednisone and we agreed on prescribing it to attempt to gain function back in the face.       Barth Kirks. Sedonia Small, [place] [email or web address]  Final Clinical Impressions(s) / ED Diagnoses     ICD-10-CM  1. Bell's palsy  G51.0     ED Discharge Orders         Ordered    predniSONE (DELTASONE) 20 MG tablet  Daily        07/13/20 1339    valACYclovir (VALTREX) 1000 MG tablet  3 times daily        07/13/20 1339           Discharge Instructions Discussed with and Provided to Patient:     Discharge Instructions     You were evaluated in the Emergency Department and after careful evaluation, we did not find any emergent condition requiring admission or further testing in the hospital.  Your exam/testing today was overall reassuring.  Your symptoms seem to be due to Bell's palsy.  Please take the prednisone and the valacyclovir as directed for the next week.  Use an eye patch at night to protect your eye.  Please return to the Emergency Department if you experience any worsening of your condition.  Thank you for allowing [place] to be a part of your care.       Maudie Flakes, MD 07/13/20 [phone]

## 2021-04-28 DIAGNOSIS — Z1211 Encounter for screening for malignant neoplasm of colon: Secondary | ICD-10-CM

## 2021-05-02 NOTE — Telephone Encounter (Signed)
Please place referral for colon cancer screening colonoscopy low Exie Parody (his last doctor was Dr. Sharlett Iles who is since retired)   Suggest make an appointment for a physical Order PSA screening test to be done before the visit to review.  (all your other lab work has been done by endocrinology)

## 2021-05-03 NOTE — Telephone Encounter (Signed)
Referral has been placed and pt is aware. 

## 2021-06-20 ENCOUNTER — Ambulatory Visit (INDEPENDENT_AMBULATORY_CARE_PROVIDER_SITE_OTHER): Payer: 59 | Admitting: Internal Medicine

## 2021-06-20 ENCOUNTER — Encounter: Payer: Self-pay | Admitting: Internal Medicine

## 2021-06-20 VITALS — BP 120/66 | HR 55 | Temp 97.8°F | Ht 69.5 in | Wt 220.2 lb

## 2021-06-20 DIAGNOSIS — Z0000 Encounter for general adult medical examination without abnormal findings: Secondary | ICD-10-CM

## 2021-06-20 DIAGNOSIS — Z8616 Personal history of COVID-19: Secondary | ICD-10-CM | POA: Diagnosis not present

## 2021-06-20 DIAGNOSIS — Z1211 Encounter for screening for malignant neoplasm of colon: Secondary | ICD-10-CM

## 2021-06-20 NOTE — Patient Instructions (Addendum)
Get back with ENT  audiology for evaluation.   Can ask   endo about  prostate  specific antigen for   screening.   And also   if want to check testosterone level.   Will  for  referral for routine  colon as you are due for this   2021 . Prevnar 20 at some point.  Shingrix   at some point.( Shingles vaccine) . Can try  ocass sleep aid for travel if needed.    Continue lifestyle intervention healthy eating and exercise .       Health Maintenance, Male Adopting a healthy lifestyle and getting preventive care are important in promoting health and wellness. Ask your health care provider about: The right schedule for you to have regular tests and exams. Things you can do on your own to prevent diseases and keep yourself healthy. What should I know about diet, weight, and exercise? Eat a healthy diet  Eat a diet that includes plenty of vegetables, fruits, low-fat dairy products, and lean protein. Do not eat a lot of foods that are high in solid fats, added sugars, or sodium. Maintain a healthy weight Body mass index (BMI) is a measurement that can be used to identify possible weight problems. It estimates body fat based on height and weight. Your health care provider can help determine your BMI and help you achieve or maintain a healthy weight. Get regular exercise Get regular exercise. This is one of the most important things you can do for your health. Most adults should: Exercise for at least 150 minutes each week. The exercise should increase your heart rate and make you sweat (moderate-intensity exercise). Do strengthening exercises at least twice a week. This is in addition to the moderate-intensity exercise. Spend less time sitting. Even light physical activity can be beneficial. Watch cholesterol and blood lipids Have your blood tested for lipids and cholesterol at 60 years of age, then have this test every 5 years. You may need to have your cholesterol levels checked more often  if: Your lipid or cholesterol levels are high. You are older than 60 years of age. You are at high risk for heart disease. What should I know about cancer screening? Many types of cancers can be detected early and may often be prevented. Depending on your health history and family history, you may need to have cancer screening at various ages. This may include screening for: Colorectal cancer. Prostate cancer. Skin cancer. Lung cancer. What should I know about heart disease, diabetes, and high blood pressure? Blood pressure and heart disease High blood pressure causes heart disease and increases the risk of stroke. This is more likely to develop in people who have high blood pressure readings or are overweight. Talk with your health care provider about your target blood pressure readings. Have your blood pressure checked: Every 3-5 years if you are 16-45 years of age. Every year if you are 28 years old or older. If you are between the ages of 63 and 56 and are a current or former smoker, ask your health care provider if you should have a one-time screening for abdominal aortic aneurysm (AAA). Diabetes Have regular diabetes screenings. This checks your fasting blood sugar level. Have the screening done: Once every three years after age 48 if you are at a normal weight and have a low risk for diabetes. More often and at a younger age if you are overweight or have a high risk for diabetes. What should I  know about preventing infection? Hepatitis B If you have a higher risk for hepatitis B, you should be screened for this virus. Talk with your health care provider to find out if you are at risk for hepatitis B infection. Hepatitis C Blood testing is recommended for: Everyone born from [birth year] through 1965. Anyone with known risk factors for hepatitis C. Sexually transmitted infections (STIs) You should be screened each year for STIs, including gonorrhea and chlamydia, if: You are sexually  active and are younger than 60 years of age. You are older than 60 years of age and your health care provider tells you that you are at risk for this type of infection. Your sexual activity has changed since you were last screened, and you are at increased risk for chlamydia or gonorrhea. Ask your health care provider if you are at risk. Ask your health care provider about whether you are at high risk for HIV. Your health care provider may recommend a prescription medicine to help prevent HIV infection. If you choose to take medicine to prevent HIV, you should first get tested for HIV. You should then be tested every 3 months for as long as you are taking the medicine. Follow these instructions at home: Alcohol use Do not drink alcohol if your health care provider tells you not to drink. If you drink alcohol: Limit how much you have to 0-2 drinks a day. Know how much alcohol is in your drink. In the [place] one drink equals one 12 oz bottle of beer (355 mL), one 5 oz glass of wine (148 mL), or one 1 oz glass of hard liquor (44 mL). Lifestyle Do not use any products that contain nicotine or tobacco. These products include cigarettes, chewing tobacco, and vaping devices, such as e-cigarettes. If you need help quitting, ask your health care provider. Do not use street drugs. Do not share needles. Ask your health care provider for help if you need support or information about quitting drugs. General instructions Schedule regular health, dental, and eye exams. Stay current with your vaccines. Tell your health care provider if: You often feel depressed. You have ever been abused or do not feel safe at home. Summary Adopting a healthy lifestyle and getting preventive care are important in promoting health and wellness. Follow your health care provider's instructions about healthy diet, exercising, and getting tested or screened for diseases. Follow your health care provider's instructions on  monitoring your cholesterol and blood pressure. This information is not intended to replace advice given to you by your health care provider. Make sure you discuss any questions you have with your health care provider. Document Revised: 12/12/2020 Document Reviewed: 12/12/2020 Elsevier Patient Education  [place].

## 2021-06-20 NOTE — Progress Notes (Signed)
Ref gi  Chief Complaint  Patient presents with   Annual Exam     HPI: Patient  Rickey Wright  60 y.o. comes in today for Preventive Health Care visit   Dm ht followed but endo [facility] need for sleeping pill.  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 May have to ttravel currently if needed uses  [facility]  Sometimes  can get 6-7 hours  then 4 hours in nocturia .at times  Ne endo althiemer retired  last a1c 6.9   Bells  palsy from booster covid.    Dec interest  libido . Due for colon cancer screening.   Health Maintenance  Topic Date Due   Pneumococcal Vaccine 69-47 Years old (1 - PCV) Never done   FOOT EXAM  Never done   Hepatitis C Screening  Never done   Zoster Vaccines- Shingrix (1 of 2) Never done   COLONOSCOPY (Pts 45-32yrs Insurance coverage will need to be confirmed)  11/08/2019   OPHTHALMOLOGY EXAM  01/12/2020   HEMOGLOBIN A1C  05/24/2020   COVID-19 Vaccine (4 - Booster) 08/20/2020   TETANUS/TDAP  02/01/2029   INFLUENZA VACCINE  Completed   HIV Screening  Completed   HPV VACCINES  Aged Out   Health Maintenance Review LIFESTYLE:  Exercise:   walking   notas much Tobacco/ETS: n Alcohol:  next to nothing  Sugar beverages: n Sleep:hard to do back to sleep   4 - 6-7  Drug use: no HH of  Work: busy in negotiations  to sell company .   ROS:  REST of 12 system review negative except as per HPI  ? Hearing aid needed .  Family hx of  hearing loss .   Past Medical History:  Diagnosis Date   ADD (attention deficit disorder)    Allergy    Anemia    hx iron deficiency    Arthritis    "knees, shoulders, wrists, ankles, back" (06/22/2014)   Asthma    "sports induced only"   DDD (degenerative disc disease), lumbosacral    Depression    Diabetes mellitus, type 2 ([place])    Hyperlipidemia    Hypertension    Hypothyroidism    Localized osteoarthritis of left knee 06/27/2012   Loose body of left knee 06/27/2012   OSA (obstructive sleep apnea)    mild not on cpap   Primary  localized osteoarthritis of right knee 06/22/2014    Past Surgical History:  Procedure Laterality Date   HAND TENDON SURGERY Left ~ 2008 X 2   JOINT REPLACEMENT     KNEE ARTHROSCOPY Right 1981; 1993; ~ 2006   KNEE ARTHROSCOPY  06/27/2012   Procedure: ARTHROSCOPY KNEE;  Surgeon: Johnny Bridge, MD;  Location: [facility];  Service: Orthopedics;  Laterality: Left;  left knee arthroscopy with removal loose foreign body , with debridement/shaving( chondroplasty)   RHINOPLASTY  2000's   TOTAL KNEE ARTHROPLASTY Right 06/22/2014   TOTAL KNEE ARTHROPLASTY Right 06/22/2014   Procedure: RIGHT TOTAL KNEE ARTHROPLASTY;  Surgeon: Johnny Bridge, MD;  Location: [facility];  Service: Orthopedics;  Laterality: Right;    Family History  Problem Relation Age of Onset   Colon polyps Mother    Heart disease Father        60s    Aneurysm Father        AAA smoker   Seizures Daughter    Thyroid disease Daughter    Cancer Maternal Grandfather  colon   Diabetes Paternal Grandmother    ADD / ADHD Son    Learning disabilities Son     Social History   Socioeconomic History   Marital status: Married    Spouse name: Not on file   Number of children: Not on file   Years of education: Not on file   Highest education level: Not on file  Occupational History   Not on file  Tobacco Use   Smoking status: Never   Smokeless tobacco: Never  Vaping Use   Vaping Use: Never used  Substance and Sexual Activity   Alcohol use: Yes    Comment: very rarely, maybe 2 a yr..   Drug use: No   Sexual activity: Yes  Other Topics Concern   Not on file  Social History Narrative   5 children  Married    Education officer, museum with work   Non smoker    H H of 7 pet dog   Social Determinants of Radio broadcast assistant Strain: Not on file  Food Insecurity: Not on file  Transportation Needs: Not on file  Physical Activity: Not on file  Stress: Not on file  Social Connections: Not on file     Outpatient Medications Prior to Visit  Medication Sig Dispense Refill   albuterol (PROVENTIL) (2.5 MG/3ML) 0.083% nebulizer solution USE 1 VIAL IN NEBULIZER EVERY 6 HOURS AS NEEDED FOR WHEEZING/SHORTNESS OF BREATH 75 mL 0   albuterol (VENTOLIN HFA) 108 (90 Base) MCG/ACT inhaler Inhale 2 puffs into the lungs every 6 (six) hours as needed for wheezing or shortness of breath. 16 g 0   aspirin EC 81 MG tablet Take 81 mg by mouth daily.     atorvastatin (LIPITOR) 40 MG tablet Take 40 mg by mouth daily.     benazepril (LOTENSIN) 10 MG tablet Take 10 mg by mouth daily.       clotrimazole (LOTRIMIN) 1 % cream Please apply to skin twice as day for the next 7 days or as needed     Continuous Blood Gluc Receiver (FREESTYLE LIBRE 14 DAY READER) DEVI See admin instructions.     Continuous Blood Gluc Sensor (FREESTYLE LIBRE 14 DAY SENSOR) MISC Apply topically as directed.     ezetimibe (ZETIA) 10 MG tablet Take 10 mg by mouth daily.       icosapent Ethyl (VASCEPA) 1 g capsule Take by mouth.     Insulin Glargine (TOUJEO MAX SOLOSTAR [place])      Insulin Pen Needle (NOVOTWIST) 32G X 5 MM MISC USE FOR INJECTIONS TWICE A DAY     JARDIANCE 25 MG TABS tablet Take 25 mg by mouth every morning.     levothyroxine (SYNTHROID) 88 MCG tablet      Liraglutide (VICTOZA) 18 MG/3ML SOLN injection Inject 1.8 mg into the skin daily.      loratadine-pseudoephedrine (CLARITIN-D 12-HOUR) 5-120 MG per tablet Take 1 tablet by mouth daily.     meloxicam (MOBIC) 15 MG tablet Take 15 mg by mouth daily.     metFORMIN (GLUCOPHAGE) 500 MG tablet Take 1,000-1,500 mg by mouth See admin instructions. Take 1000 mg by mouth in the morning and 1500 mg by mouth at bedtime     permethrin (ELIMITE) 5 % cream Apply 5% cream to entire body from the neck down,  wash after 8 to 14 hours can repeat in 14 days 60 g 0   pioglitazone (ACTOS) 45 MG tablet Take 45 mg by mouth daily.  Semaglutide,0.25 or 0.5MG /DOS, (OZEMPIC, 0.25 OR 0.5 MG/DOSE,) 2  MG/1.5ML SOPN Inject 0.25 mg weekly for 4 weeks, then 0.5 mg weekly for diabetes     VALIUM 5 MG tablet Take 2 tab by mouth one hour prior to MRI repeat if necessary, driver needed     VASCEPA 1 g capsule Take 2 g by mouth 3 (three) times daily.     atorvastatin (LIPITOR) 10 MG tablet Take 10 mg by mouth daily.       SYNTHROID 75 MCG tablet Take 75 mcg by mouth daily before breakfast.   12   No facility-administered medications prior to visit.     EXAM:  BP 120/66 (BP Location: Left Arm, Patient Position: Sitting, Cuff Size: Normal)   Pulse (!) 55   Temp 97.8 F (36.6 C) (Oral)   Ht 5' 9.5" (1.765 m)   Wt 220 lb 3.2 oz (99.9 kg)   SpO2 96%   BMI 32.05 kg/m   Body mass index is 32.05 kg/m. Wt Readings from Last 3 Encounters:  06/20/21 220 lb 3.2 oz (99.9 kg)  03/30/20 212 lb 3.2 oz (96.3 kg)  08/25/18 247 lb 4.8 oz (112.2 kg)    Physical Exam: Vital signs reviewed HBZ:JIRC is a well-developed well-nourished alert cooperative    who appearsr stated age in no acute distress.  HEENT: normocephalic atraumatic , Eyes: PERRL EOM's full, conjunctiva clear, tenderness., Ears: no deformity EAC's clear TMs with normal landmarks. Mouth:  masked  NECK: supple without masses, thyromegaly or bruits. CHEST/PULM:  Clear to auscultation and percussion breath sounds equal no wheeze , rales or rhonchi. No chest wall deformities or tenderness. CV: PMI is nondisplaced, S1 S2 no gallops, murmurs, rubs. Peripheral pulses are full without delay.No JVD .  ABDOMEN: Bowel sounds normal nontender  No guard or rebound, no hepato splenomegal no CVA tenderness.   Extremtities:  No clubbing cyanosis or edema, no acute joint swelling or redness no focal atrophy NEURO:  Oriented x3, cranial nerves 3-12 appear to be intact, no obvious focal weakness,gait within normal limits no abnormal reflexes or asymmetrical SKIN: No acute rashes normal turgor, color, no bruising or petechiae. PSYCH: Oriented, good eye  contact, no obvious depression anxiety, cognition and judgment appear normal. LN: no cervical axillary inguinal adenopathy  BP Readings from Last 3 Encounters:  06/20/21 120/66  07/13/20 129/67  03/30/20 120/64   See notes  from endo  UTD   ASSESSMENT AND PLAN:  Discussed the following assessment and plan:    ICD-10-CM   1. Visit for preventive health examination  Z00.00     2. Colon cancer screening  Z12.11 Ambulatory referral to Gastroenterology    3. History of COVID-19  Z86.16      Dm, HT HLD Situational insomnia   disc meds and if needed can order  in future if needed  zolpidem or similar . Dec libido  consider  asking endo test level if indicated  also psa screen at next blood draw.  Hearing eval updated   Return in about 1 year (around 06/20/2022) for cpx .  Patient Care Team: Linsey Hirota, Standley Brooking, MD as PCP - General [place], Legrand Como, MD (Endocrinology) Patient Instructions  Get back with ENT  audiology for evaluation.   Can ask   endo about  prostate  specific antigen for   screening.   And also   if want to check testosterone level.   Will  for  referral for routine  colon as you are due  for this   2021 . Prevnar 20 at some point.  Shingrix   at some point.( Shingles vaccine) . Can try  ocass sleep aid for travel if needed.    Continue lifestyle intervention healthy eating and exercise .       Health Maintenance, Male Adopting a healthy lifestyle and getting preventive care are important in promoting health and wellness. Ask your health care provider about: The right schedule for you to have regular tests and exams. Things you can do on your own to prevent diseases and keep yourself healthy. What should I know about diet, weight, and exercise? Eat a healthy diet  Eat a diet that includes plenty of vegetables, fruits, low-fat dairy products, and lean protein. Do not eat a lot of foods that are high in solid fats, added sugars, or sodium. Maintain a  healthy weight Body mass index (BMI) is a measurement that can be used to identify possible weight problems. It estimates body fat based on height and weight. Your health care provider can help determine your BMI and help you achieve or maintain a healthy weight. Get regular exercise Get regular exercise. This is one of the most important things you can do for your health. Most adults should: Exercise for at least 150 minutes each week. The exercise should increase your heart rate and make you sweat (moderate-intensity exercise). Do strengthening exercises at least twice a week. This is in addition to the moderate-intensity exercise. Spend less time sitting. Even light physical activity can be beneficial. Watch cholesterol and blood lipids Have your blood tested for lipids and cholesterol at 60 years of age, then have this test every 5 years. You may need to have your cholesterol levels checked more often if: Your lipid or cholesterol levels are high. You are older than 60 years of age. You are at high risk for heart disease. What should I know about cancer screening? Many types of cancers can be detected early and may often be prevented. Depending on your health history and family history, you may need to have cancer screening at various ages. This may include screening for: Colorectal cancer. Prostate cancer. Skin cancer. Lung cancer. What should I know about heart disease, diabetes, and high blood pressure? Blood pressure and heart disease High blood pressure causes heart disease and increases the risk of stroke. This is more likely to develop in people who have high blood pressure readings or are overweight. Talk with your health care provider about your target blood pressure readings. Have your blood pressure checked: Every 3-5 years if you are 58-37 years of age. Every year if you are 75 years old or older. If you are between the ages of 30 and 71 and are a current or former smoker,  ask your health care provider if you should have a one-time screening for abdominal aortic aneurysm (AAA). Diabetes Have regular diabetes screenings. This checks your fasting blood sugar level. Have the screening done: Once every three years after age 72 if you are at a normal weight and have a low risk for diabetes. More often and at a younger age if you are overweight or have a high risk for diabetes. What should I know about preventing infection? Hepatitis B If you have a higher risk for hepatitis B, you should be screened for this virus. Talk with your health care provider to find out if you are at risk for hepatitis B infection. Hepatitis C Blood testing is recommended for: Everyone born from [birth year]  through 1965. Anyone with known risk factors for hepatitis C. Sexually transmitted infections (STIs) You should be screened each year for STIs, including gonorrhea and chlamydia, if: You are sexually active and are younger than 60 years of age. You are older than 60 years of age and your health care provider tells you that you are at risk for this type of infection. Your sexual activity has changed since you were last screened, and you are at increased risk for chlamydia or gonorrhea. Ask your health care provider if you are at risk. Ask your health care provider about whether you are at high risk for HIV. Your health care provider may recommend a prescription medicine to help prevent HIV infection. If you choose to take medicine to prevent HIV, you should first get tested for HIV. You should then be tested every 3 months for as long as you are taking the medicine. Follow these instructions at home: Alcohol use Do not drink alcohol if your health care provider tells you not to drink. If you drink alcohol: Limit how much you have to 0-2 drinks a day. Know how much alcohol is in your drink. In the [place] one drink equals one 12 oz bottle of beer (355 mL), one 5 oz glass of wine (148 mL), or one 1  oz glass of hard liquor (44 mL). Lifestyle Do not use any products that contain nicotine or tobacco. These products include cigarettes, chewing tobacco, and vaping devices, such as e-cigarettes. If you need help quitting, ask your health care provider. Do not use street drugs. Do not share needles. Ask your health care provider for help if you need support or information about quitting drugs. General instructions Schedule regular health, dental, and eye exams. Stay current with your vaccines. Tell your health care provider if: You often feel depressed. You have ever been abused or do not feel safe at home. Summary Adopting a healthy lifestyle and getting preventive care are important in promoting health and wellness. Follow your health care provider's instructions about healthy diet, exercising, and getting tested or screened for diseases. Follow your health care provider's instructions on monitoring your cholesterol and blood pressure. This information is not intended to replace advice given to you by your health care provider. Make sure you discuss any questions you have with your health care provider. Document Revised: 12/12/2020 Document Reviewed: 12/12/2020 Elsevier Patient Education  2022 Rickey Wright M.D.

## 2021-07-06 ENCOUNTER — Encounter: Payer: Self-pay | Admitting: Gastroenterology

## 2021-08-23 ENCOUNTER — Other Ambulatory Visit: Payer: Self-pay

## 2021-08-23 ENCOUNTER — Ambulatory Visit (AMBULATORY_SURGERY_CENTER): Payer: 59

## 2021-08-23 VITALS — Ht 70.0 in | Wt 210.0 lb

## 2021-08-23 DIAGNOSIS — Z1211 Encounter for screening for malignant neoplasm of colon: Secondary | ICD-10-CM

## 2021-08-23 MED ORDER — NA SULFATE-K SULFATE-MG SULF 17.5-3.13-1.6 GM/177ML PO SOLN
1.0000 | Freq: Once | ORAL | 0 refills | Status: AC
Start: 2021-08-23 — End: 2021-08-23

## 2021-08-23 NOTE — Progress Notes (Signed)
No egg or soy allergy known to patient  No issues known to pt with past sedation with any surgeries or procedures Patient denies ever being told they had issues or difficulty with intubation  No FH of Malignant Hyperthermia Pt is not on diet pills Pt is not on home 02  Pt is not on blood thinners  Pt denies issues with constipation at this time; No A fib or A flutter Pt is fully vaccinated for Covid x 2 + booster; NO PA's for preps discussed with pt in PV today  Discussed with pt there will be an out-of-pocket cost for prep and that varies from $0 to 70 + dollars - pt verbalized understanding  Due to the COVID-19 pandemic we are asking patients to follow certain guidelines in PV and the [facility]   Pt aware of COVID protocols and [facility] guidelines  PV completed over the phone. Pt verified name, DOB, address and insurance during PV today.  Pt mailed instruction packet with copy of consent form to read and not return, and instructions.  Pt encouraged to call with questions or issues.  If pt has My chart, procedure instructions sent via My Chart

## 2021-08-31 ENCOUNTER — Encounter: Payer: Self-pay | Admitting: Gastroenterology

## 2021-09-05 ENCOUNTER — Encounter: Payer: Self-pay | Admitting: Certified Registered Nurse Anesthetist

## 2021-09-06 ENCOUNTER — Ambulatory Visit (AMBULATORY_SURGERY_CENTER): Payer: 59 | Admitting: Gastroenterology

## 2021-09-06 ENCOUNTER — Other Ambulatory Visit: Payer: Self-pay

## 2021-09-06 ENCOUNTER — Encounter: Payer: Self-pay | Admitting: Gastroenterology

## 2021-09-06 VITALS — BP 101/55 | HR 75 | Temp 97.5°F | Resp 14 | Ht 69.5 in | Wt 210.0 lb

## 2021-09-06 DIAGNOSIS — D122 Benign neoplasm of ascending colon: Secondary | ICD-10-CM

## 2021-09-06 DIAGNOSIS — Z86010 Personal history of colonic polyps: Secondary | ICD-10-CM | POA: Diagnosis not present

## 2021-09-06 DIAGNOSIS — Z1211 Encounter for screening for malignant neoplasm of colon: Secondary | ICD-10-CM

## 2021-09-06 DIAGNOSIS — D120 Benign neoplasm of cecum: Secondary | ICD-10-CM

## 2021-09-06 DIAGNOSIS — D124 Benign neoplasm of descending colon: Secondary | ICD-10-CM | POA: Diagnosis not present

## 2021-09-06 MED ORDER — SODIUM CHLORIDE 0.9 % IV SOLN: 500.0000 mL | Freq: Once | INTRAVENOUS | Status: DC

## 2021-09-06 NOTE — Progress Notes (Signed)
Called to room to assist during endoscopic procedure.  Patient ID and intended procedure confirmed with present staff. Received instructions for my participation in the procedure from the performing physician.  

## 2021-09-06 NOTE — Patient Instructions (Signed)
Resume previous diet and medications today.  Your blood sugar in recovery was 124. Resume normal daily activities tomorrow, 09/07/21. Recommend next colonoscopy in 3 years,  will be using  Golytlely prep next time for more thorough clean out. Results will be back in approximately 2 weeks.        YOU HAD AN ENDOSCOPIC PROCEDURE TODAY AT [facility]:   Refer to the procedure report that was given to you for any specific questions about what was found during the examination.  If the procedure report does not answer your questions, please call your gastroenterologist to clarify.  If you requested that your care partner not be given the details of your procedure findings, then the procedure report has been included in a sealed envelope for you to review at your convenience later.  YOU SHOULD EXPECT: Some feelings of bloating in the abdomen. Passage of more gas than usual.  Walking can help get rid of the air that was put into your GI tract during the procedure and reduce the bloating. If you had a lower endoscopy (such as a colonoscopy or flexible sigmoidoscopy) you may notice spotting of blood in your stool or on the toilet paper. If you underwent a bowel prep for your procedure, you may not have a normal bowel movement for a few days.  Please Note:  You might notice some irritation and congestion in your nose or some drainage.  This is from the oxygen used during your procedure.  There is no need for concern and it should clear up in a day or so.  SYMPTOMS TO REPORT IMMEDIATELY:  Following lower endoscopy (colonoscopy or flexible sigmoidoscopy):  Excessive amounts of blood in the stool  Significant tenderness or worsening of abdominal pains  Swelling of the abdomen that is new, acute  Fever of 100F or higher    For urgent or emergent issues, a gastroenterologist can be reached at any hour by calling [phone]. Do not use MyChart messaging for urgent concerns.    DIET:   We do recommend a small meal at first, but then you may proceed to your regular diet.  Drink plenty of fluids but you should avoid alcoholic beverages for 24 hours.  ACTIVITY:  You should plan to take it easy for the rest of today and you should NOT DRIVE or use heavy machinery until tomorrow (because of the sedation medicines used during the test).    FOLLOW UP: Our staff will call the number listed on your records 48-72 hours following your procedure to check on you and address any questions or concerns that you may have regarding the information given to you following your procedure. If we do not reach you, we will leave a message.  We will attempt to reach you two times.  During this call, we will ask if you have developed any symptoms of COVID 19. If you develop any symptoms (ie: fever, flu-like symptoms, shortness of breath, cough etc.) before then, please call [phone].  If you test positive for Covid 19 in the 2 weeks post procedure, please call and report this information to [place].    If any biopsies were taken you will be contacted by phone or by letter within the next 1-3 weeks.  Please call us at [phone] if you have not heard about the biopsies in 3 weeks.    SIGNATURES/CONFIDENTIALITY: You and/or your care partner have signed paperwork which will be entered into your electronic medical record.  These  signatures attest to the fact that that the information above on your After Visit Summary has been reviewed and is understood.  Full responsibility of the confidentiality of this discharge information lies with you and/or your care-partner.

## 2021-09-06 NOTE — Progress Notes (Signed)
Report given to PACU, vss 

## 2021-09-06 NOTE — Progress Notes (Signed)
Rickey Wright, CMA - VS  Pt's states no medical or surgical changes since previsit or office visit.   Notified Rickey Garin, CRNA pt ate a handful of clear gummy bears Tuesday am at 10:00 am.  Rickey Wright

## 2021-09-06 NOTE — Progress Notes (Signed)
History and Physical:  This patient presents for endoscopic testing for: Encounter Diagnosis  Name Primary?   Colon cancer screening Yes    Last colonoscopy 11/2009 Patient denies chronic abdominal pain, rectal bleeding, constipation or diarrhea.   ROS: Patient denies chest pain or cough   Past Medical History: Past Medical History:  Diagnosis Date   ADD (attention deficit disorder)    Allergy    Anemia    hx iron deficiency    Arthritis    "knees, shoulders, wrists, ankles, back" (06/22/2014)   Asthma    "sports induced only"   DDD (degenerative disc disease), lumbosacral    Depression    Diabetes mellitus, type 2 ([place])    Hyperlipidemia    on meds   Hypertension    on meds   Hypothyroidism    on meds   Localized osteoarthritis of left knee 06/27/2012   Loose body of left knee 06/27/2012   OSA (obstructive sleep apnea)    mild not on cpap   Primary localized osteoarthritis of right knee 06/22/2014     Past Surgical History: Past Surgical History:  Procedure Laterality Date   HAND TENDON SURGERY Left 2008   x2   KNEE ARTHROSCOPY Right 1981   1993, 2006   KNEE ARTHROSCOPY  06/27/2012   Procedure: ARTHROSCOPY KNEE;  Surgeon: Johnny Bridge, MD;  Location: [facility];  Service: Orthopedics;  Laterality: Left;  left knee arthroscopy with removal loose foreign body , with debridement/shaving( chondroplasty)   RHINOPLASTY  2000   ROTATOR CUFF REPAIR Left 2021   TOTAL KNEE ARTHROPLASTY Right 06/22/2014   Procedure: RIGHT TOTAL KNEE ARTHROPLASTY;  Surgeon: Johnny Bridge, MD;  Location: [facility];  Service: Orthopedics;  Laterality: Right;    Allergies: Allergies  Allergen Reactions   Horse-Derived Products Other (See Comments)    Unknown   Other Other (See Comments)    Horse-derived Products REACTION: UNKNOWN Unknown    Outpatient Meds: Current Outpatient Medications  Medication Sig Dispense Refill   aspirin EC 81 MG tablet Take 81 mg by  mouth daily.     atorvastatin (LIPITOR) 40 MG tablet Take 40 mg by mouth daily.     B-D UF III MINI PEN NEEDLES 31G X 5 MM MISC SMARTSIG:Injection Daily     benazepril (LOTENSIN) 10 MG tablet Take 10 mg by mouth daily.       Cholecalciferol 50 MCG (2000 UT) CAPS Take 1 capsule by mouth daily at 6 (six) AM.     Continuous Blood Gluc Sensor (FREESTYLE LIBRE 14 DAY SENSOR) MISC Apply topically as directed.     Continuous Blood Gluc Sensor (FREESTYLE LIBRE 14 DAY SENSOR) MISC See admin instructions.     Cyanocobalamin 5000 MCG CAPS Take 1 capsule by mouth every other day.     ezetimibe (ZETIA) 10 MG tablet Take 10 mg by mouth daily.       Insulin Pen Needle (B-D UF III MINI PEN NEEDLES) 31G X 5 MM MISC Use as directed for injection once a day     Insulin Pen Needle (B-D UF III MINI PEN NEEDLES) 31G X 5 MM MISC Use one pen needle SQ 2 times daily to admin Toujeo once daily and Victoza once daily.     Insulin Pen Needle (NOVOTWIST) 32G X 5 MM MISC USE FOR INJECTIONS TWICE A DAY     Insulin Syringe-Needle U-100 (GLOBAL INJECT EASE INSULIN SYR) 30G X 1/2" 0.3 ML MISC Use for vitamin B12 injections  as directed     JARDIANCE 25 MG TABS tablet Take 25 mg by mouth every morning.     levothyroxine (SYNTHROID) 88 MCG tablet      loratadine-pseudoephedrine (CLARITIN-D 12-HOUR) 5-120 MG per tablet Take 1 tablet by mouth daily.     meloxicam (MOBIC) 15 MG tablet Take 15 mg by mouth daily.     metFORMIN (GLUCOPHAGE) 500 MG tablet Take 1,000-1,500 mg by mouth See admin instructions. Take 1000 mg by mouth in the morning and 1500 mg by mouth at bedtime     pioglitazone (ACTOS) 45 MG tablet Take 45 mg by mouth daily.      TOUJEO MAX SOLOSTAR 300 UNIT/ML Solostar Pen Inject 3 mLs into the skin daily at 6 (six) AM.     VASCEPA 1 g capsule Take 2 g by mouth 3 (three) times daily.     Semaglutide,0.25 or 0.5MG /DOS, (OZEMPIC, 0.25 OR 0.5 MG/DOSE,) 2 MG/1.5ML SOPN Inject 0.25 mg weekly for 4 weeks, then 0.5 mg weekly for  diabetes     VALIUM 5 MG tablet Take 2 tab by mouth one hour prior to MRI repeat if necessary, driver needed (Patient not taking: Reported on 08/23/2021)     Current Facility-Administered Medications  Medication Dose Route Frequency Provider Last Rate Last Admin   0.9 %  sodium chloride infusion  500 mL Intravenous Once Doran Stabler, MD          ___________________________________________________________________ Objective   Exam:  BP 128/73    Pulse 75    Temp (!) 97.5 F (36.4 C)    Ht 5' 9.5" (1.765 m)    Wt 210 lb (95.3 kg)    SpO2 96%    BMI 30.57 kg/m   CV: RRR without murmur, S1/S2 Resp: clear to auscultation bilaterally, normal RR and effort noted GI: soft, no tenderness, with active bowel sounds.   Assessment: Encounter Diagnosis  Name Primary?   Colon cancer screening Yes     Plan: Colonoscopy  The benefits and risks of the planned procedure were described in detail with the patient or (when appropriate) their health care proxy.  Risks were outlined as including, but not limited to, bleeding, infection, perforation, adverse medication reaction leading to cardiac or pulmonary decompensation, pancreatitis (if ERCP).  The limitation of incomplete mucosal visualization was also discussed.  No guarantees or warranties were given.    The patient is appropriate for an endoscopic procedure in the ambulatory setting.   - Wilfrid Lund, MD

## 2021-09-06 NOTE — Op Note (Signed)
[facility] Patient Name: Rickey Wright Procedure Date: 09/06/2021 7:50 AM MRN: [id number] Endoscopist: [place]. Loletha Carrow , MD Age: 61 Referring MD:  Date of Birth: 1960/08/11 Gender: Male Account #: [id number] Procedure:                Colonoscopy Indications:              Screening for colorectal malignant neoplasm                           no adenomatous or serrated polyps on last                            colonoscopy 11/2009 Medicines:                Monitored Anesthesia Care Procedure:                Pre-Anesthesia Assessment:                           - Prior to the procedure, a History and Physical                            was performed, and patient medications and                            allergies were reviewed. The patient's tolerance of                            previous anesthesia was also reviewed. The risks                            and benefits of the procedure and the sedation                            options and risks were discussed with the patient.                            All questions were answered, and informed consent                            was obtained. Prior Anticoagulants: The patient has                            taken no previous anticoagulant or antiplatelet                            agents. ASA Grade Assessment: II - A patient with                            mild systemic disease. After reviewing the risks                            and benefits, the patient was deemed in  satisfactory condition to undergo the procedure.                           After obtaining informed consent, the colonoscope                            was passed under direct vision. Throughout the                            procedure, the patient's blood pressure, pulse, and                            oxygen saturations were monitored continuously. The                            CF [id number] #[id number] was introduced through the anus                             and advanced to the the cecum, identified by                            appendiceal orifice and ileocecal valve. The                            colonoscopy was performed without difficulty. The                            patient tolerated the procedure well. The quality                            of the bowel preparation was fair in some areas                            despite lavage (fibrous debris). The ileocecal                            valve, appendiceal orifice, and rectum were                            photographed. The bowel preparation used was SUPREP. Scope In: 7:58:12 AM Scope Out: 8:20:01 AM Scope Withdrawal Time: 0 hours 18 minutes 25 seconds  Total Procedure Duration: 0 hours 21 minutes 49 seconds  Findings:                 The perianal and digital rectal examinations were                            normal.                           Repeat examination of right colon under NBI                            performed.  Four sessile polyps were found in the descending                            colon, ascending colon and cecum. The polyps were 2                            to 5 mm in size. These polyps were removed with a                            cold snare. Resection and retrieval were complete.                           The exam was otherwise without abnormality on                            direct and retroflexion views. Complications:            No immediate complications. Estimated Blood Loss:     Estimated blood loss was minimal. Impression:               - Preparation of the colon was fair.                           - Four 2 to 5 mm polyps in the descending colon, in                            the ascending colon and in the cecum, removed with                            a cold snare. Resected and retrieved.                           - The examination was otherwise normal on direct                            and retroflexion  views. Recommendation:           - Patient has a contact number available for                            emergencies. The signs and symptoms of potential                            delayed complications were discussed with the                            patient. Return to normal activities tomorrow.                            Written discharge instructions were provided to the                            patient.                           -  Resume previous diet.                           - Continue present medications.                           - Await pathology results.                           - Repeat colonoscopy in 3 years for surveillance                            (prep). Josuel Koeppen L. Loletha Carrow, MD 09/06/2021 8:26:17 AM This report has been signed electronically.

## 2021-09-08 ENCOUNTER — Telehealth: Payer: Self-pay

## 2021-09-08 ENCOUNTER — Telehealth: Payer: Self-pay | Admitting: *Deleted

## 2021-09-08 NOTE — Telephone Encounter (Signed)
Called [phone] and left a message we tried to reach pt for a follow up call. maw

## 2021-09-08 NOTE — Telephone Encounter (Signed)
°  Follow up Call-  Call back number 09/06/2021  Post procedure Call Back phone  # [phone] cell  Permission to leave phone message Yes  Some recent data might be hidden     Patient questions:  Do you have a fever, pain , or abdominal swelling? No. Pain Score  0 *  Have you tolerated food without any problems? Yes.    Have you been able to return to your normal activities? Yes.    Do you have any questions about your discharge instructions: Diet   No. Medications  No. Follow up visit  No.  Do you have questions or concerns about your Care? No.  Actions: * If pain score is 4 or above: No action needed, pain <4.  Have you developed a fever since your procedure? no  2.   Have you had an respiratory symptoms (SOB or cough) since your procedure? no  3.   Have you tested positive for COVID 19 since your procedure no  4.   Have you had any family members/close contacts diagnosed with the COVID 19 since your procedure?  no   If yes to any of these questions please route to Joylene John, RN and Joella Prince, RN

## 2021-09-11 ENCOUNTER — Encounter: Payer: Self-pay | Admitting: Gastroenterology

## 2021-10-17 ENCOUNTER — Ambulatory Visit: Payer: 59 | Admitting: Internal Medicine

## 2021-10-17 VITALS — BP 130/72 | HR 82 | Temp 99.5°F | Ht 69.5 in | Wt 225.6 lb

## 2021-10-17 DIAGNOSIS — R0981 Nasal congestion: Secondary | ICD-10-CM

## 2021-10-17 DIAGNOSIS — J22 Unspecified acute lower respiratory infection: Secondary | ICD-10-CM | POA: Diagnosis not present

## 2021-10-17 DIAGNOSIS — R059 Cough, unspecified: Secondary | ICD-10-CM

## 2021-10-17 DIAGNOSIS — J329 Chronic sinusitis, unspecified: Secondary | ICD-10-CM | POA: Diagnosis not present

## 2021-10-17 LAB — POCT INFLUENZA A/B
Influenza A, POC: NEGATIVE
Influenza B, POC: NEGATIVE

## 2021-10-17 LAB — POC COVID19 BINAXNOW: SARS Coronavirus 2 Ag: NEGATIVE

## 2021-10-17 MED ORDER — ALBUTEROL SULFATE HFA 108 (90 BASE) MCG/ACT IN AERS: 2.0000 | INHALATION_SPRAY | Freq: Four times a day (QID) | RESPIRATORY_TRACT | 2 refills | Status: AC | PRN

## 2021-10-17 MED ORDER — DOXYCYCLINE HYCLATE 100 MG PO TABS: 100.0000 mg | ORAL_TABLET | Freq: Two times a day (BID) | ORAL | 0 refills | Status: DC

## 2021-10-17 NOTE — Patient Instructions (Signed)
This may be viral  trigger   some musical sounds in chest but no pneumonia  signs .  ? ?Could be atypical     germs   . With sinus involvement  ?Saline  nasal   can add antibiotic .  ? ?Will send in inhaler  . ?

## 2021-10-17 NOTE — Progress Notes (Signed)
? ?Chief Complaint  ?Patient presents with  ? Cough  ?  Sore throat, sinus, taking nyquiul and dayquil  ? ? ?HPI: ?Rickey Wright 61 y.o. come in for respiratory tract infection that he thinks is a sinus infection that he tends to get every year.  Last treatment for sinusitis was about a year ago ? ?Began having symptoms march 9   sinus pain may be low-grade fever later scratchy sore throat now cough snorting discolored mucus bilaterally and significant nasal stuffiness. ?Somewhat of a cough family is worried about him getting bronchitis and told him to go to the doctor DayQuil and NyQuil ? ?Sinus pain began  at onset.  ?Snorting.  To have nasal opening ?No chest pain shortness of breath ?Family has had some respiratory illnesses daughter granddaughter but he thinks he gave it to them. ?Remote history of COVID.  States it does not feel like COVID. ?Diabetes is in control. ? ? ?ROS: See pertinent positives and negatives per HPI. ? ?Past Medical History:  ?Diagnosis Date  ? ADD (attention deficit disorder)   ? Allergy   ? Anemia   ? hx iron deficiency   ? Arthritis   ? "knees, shoulders, wrists, ankles, back" (06/22/2014)  ? Asthma   ? "sports induced only"  ? DDD (degenerative disc disease), lumbosacral   ? Depression   ? Diabetes mellitus, type 2 ([place])   ? Hyperlipidemia   ? on meds  ? Hypertension   ? on meds  ? Hypothyroidism   ? on meds  ? Localized osteoarthritis of left knee 06/27/2012  ? Loose body of left knee 06/27/2012  ? OSA (obstructive sleep apnea)   ? mild not on cpap  ? Primary localized osteoarthritis of right knee 06/22/2014  ? ? ?Family History  ?Problem Relation Age of Onset  ? Cancer Mother 69  ?     bile duct cancer  ? Heart disease Father   ?     22s   ? Aneurysm Father   ?     AAA smoker  ? Colon polyps Sister 7  ? Colon cancer Maternal Grandfather 46  ? Colon polyps Maternal Grandfather 30  ? Cancer Maternal Grandfather   ?     colon  ? Diabetes Paternal Grandmother   ? Seizures Daughter   ?  Thyroid disease Daughter   ? ADD / ADHD Son   ? Learning disabilities Son   ? Esophageal cancer Neg Hx   ? Rectal cancer Neg Hx   ? Stomach cancer Neg Hx   ? ? ?Social History  ? ?Socioeconomic History  ? Marital status: Married  ?  Spouse name: Not on file  ? Number of children: Not on file  ? Years of education: Not on file  ? Highest education level: Master's degree (e.g., MA, MS, MEng, MEd, MSW, MBA)  ?Occupational History  ? Not on file  ?Tobacco Use  ? Smoking status: Never  ? Smokeless tobacco: Never  ?Vaping Use  ? Vaping Use: Never used  ?Substance and Sexual Activity  ? Alcohol use: Not Currently  ? Drug use: No  ? Sexual activity: Yes  ?Other Topics Concern  ? Not on file  ?Social History Narrative  ? 5 children  Married   ? Travels with work  ? Non smoker   ? H H of 7 pet dog  ? ?Social Determinants of Health  ? ?Financial Resource Strain: Low Risk   ? Difficulty of Paying Living  Expenses: Not hard at all  ?Food Insecurity: No Food Insecurity  ? Worried About Charity fundraiser in the Last Year: Never true  ? Ran Out of Food in the Last Year: Never true  ?Transportation Needs: No Transportation Needs  ? Lack of Transportation (Medical): No  ? Lack of Transportation (Non-Medical): No  ?Physical Activity: Insufficiently Active  ? Days of Exercise per Week: 1 day  ? Minutes of Exercise per Session: 60 min  ?Stress: Stress Concern Present  ? Feeling of Stress : To some extent  ?Social Connections: Moderately Integrated  ? Frequency of Communication with Friends and Family: More than three times a week  ? Frequency of Social Gatherings with Friends and Family: More than three times a week  ? Attends Religious Services: More than 4 times per year  ? Active Member of Clubs or Organizations: No  ? Attends Archivist Meetings: Not on file  ? Marital Status: Married  ? ? ?Outpatient Medications Prior to Visit  ?Medication Sig Dispense Refill  ? aspirin EC 81 MG tablet Take 81 mg by mouth daily.    ?  atorvastatin (LIPITOR) 40 MG tablet Take 40 mg by mouth daily.    ? B-D UF III MINI PEN NEEDLES 31G X 5 MM MISC SMARTSIG:Injection Daily    ? benazepril (LOTENSIN) 10 MG tablet Take 10 mg by mouth daily.      ? Cholecalciferol 50 MCG (2000 UT) CAPS Take 1 capsule by mouth daily at 6 (six) AM.    ? Continuous Blood Gluc Sensor (FREESTYLE LIBRE 14 DAY SENSOR) MISC Apply topically as directed.    ? Continuous Blood Gluc Sensor (FREESTYLE LIBRE 14 DAY SENSOR) MISC See admin instructions.    ? Cyanocobalamin 5000 MCG CAPS Take 1 capsule by mouth every other day.    ? ezetimibe (ZETIA) 10 MG tablet Take 10 mg by mouth daily.      ? Insulin Pen Needle (B-D UF III MINI PEN NEEDLES) 31G X 5 MM MISC Use as directed for injection once a day    ? Insulin Pen Needle (B-D UF III MINI PEN NEEDLES) 31G X 5 MM MISC Use one pen needle SQ 2 times daily to admin Toujeo once daily and Victoza once daily.    ? Insulin Pen Needle (NOVOTWIST) 32G X 5 MM MISC USE FOR INJECTIONS TWICE A DAY    ? Insulin Syringe-Needle U-100 (GLOBAL INJECT EASE INSULIN SYR) 30G X 1/2" 0.3 ML MISC Use for vitamin B12 injections as directed    ? JARDIANCE 25 MG TABS tablet Take 25 mg by mouth every morning.    ? levothyroxine (SYNTHROID) 88 MCG tablet     ? loratadine-pseudoephedrine (CLARITIN-D 12-HOUR) 5-120 MG per tablet Take 1 tablet by mouth daily.    ? meloxicam (MOBIC) 15 MG tablet Take 15 mg by mouth daily.    ? metFORMIN (GLUCOPHAGE) 500 MG tablet Take 1,000-1,500 mg by mouth See admin instructions. Take 1000 mg by mouth in the morning and 1500 mg by mouth at bedtime    ? pioglitazone (ACTOS) 45 MG tablet Take 45 mg by mouth daily.     ? Semaglutide,0.25 or 0.'5MG'$ /DOS, (OZEMPIC, 0.25 OR 0.5 MG/DOSE,) 2 MG/1.5ML SOPN Inject 0.25 mg weekly for 4 weeks, then 0.5 mg weekly for diabetes    ? TOUJEO MAX SOLOSTAR 300 UNIT/ML Solostar Pen Inject 3 mLs into the skin daily at 6 (six) AM.    ? VALIUM 5 MG tablet     ?  VASCEPA 1 g capsule Take 2 g by mouth 3  (three) times daily.    ? ?No facility-administered medications prior to visit.  ? ? ? ?EXAM: ? ?BP 130/72 (BP Location: Left Arm, Patient Position: Sitting, Cuff Size: Normal)   Pulse 82   Temp 99.5 ?F (37.5 ?C) (Oral)   Ht 5' 9.5" (1.765 m)   Wt 225 lb 9.6 oz (102.3 kg)   SpO2 98%   BMI 32.84 kg/m?  ? ?Body mass index is 32.84 kg/m?. ? ?GENERAL: vitals reviewed and listed above, alert, oriented, appears well hydrated and in no acute distress moderate congestion no dyspnea ?HEENT: atraumatic, conjunctiva  clear, no obvious abnormalities on inspection of external nose and ears TMs clear +1 wax right OP : no lesion edema or exudate mild erythema nasal mucosa +3 congested face minimally tender no edema ?NECK: no obvious masses on inspection palpation  ?LUNGS: Breath sounds equal occasional fine musical wheeze but no rales no rhonchi ?CV: HRRR, no clubbing cyanosis or  peripheral edema nl cap refill  ?MS: moves all extremities without noticeable focal  abnormality ?PSYCH: pleasant and cooperative, no obvious depression or anxiety ? ?BP Readings from Last 3 Encounters:  ?10/17/21 130/72  ?09/06/21 (!) 101/55  ?06/20/21 120/66  ? ?Rapid flu and COVID-negative ?ASSESSMENT AND PLAN: ? ?Discussed the following assessment and plan: ? ?Sinus congestion - Plan: POCT Influenza A/B, POC COVID-19 ? ?Cough, unspecified type - Plan: POCT Influenza A/B, POC COVID-19 ? ?Acute respiratory infection ? ?Sinusitis, unspecified chronicity, unspecified location - hx recurrent  sinusitis ?This primarily could be viral but has high risk bacterial sinusitis ?Lower respiratory wheeze add albuterol as a trial again could be viral or atypical ?At risk ?Albuterol inhaler prescribed as needed supportive care can had doxycycline twice daily for 7 days for possibly secondary bacterial sinusitis and/atypicals.  Support. ?-Patient advised to return or notify health care team  if  new concerns arise. ? ?Patient Instructions  ? This may be viral   trigger   some musical sounds in chest but no pneumonia  signs .  ? ?Could be atypical     germs   . With sinus involvement  ?Saline  nasal   can add antibiotic .  ? ?Will send in inhaler  . ? ? ?Standley Brooking. Deziree Mokry M.

## 2022-01-09 ENCOUNTER — Encounter: Payer: Self-pay | Admitting: Internal Medicine

## 2022-10-23 ENCOUNTER — Ambulatory Visit: Payer: 59 | Admitting: Podiatry

## 2022-10-23 DIAGNOSIS — E119 Type 2 diabetes mellitus without complications: Secondary | ICD-10-CM | POA: Diagnosis not present

## 2022-10-23 DIAGNOSIS — M216X2 Other acquired deformities of left foot: Secondary | ICD-10-CM | POA: Diagnosis not present

## 2022-10-23 DIAGNOSIS — L84 Corns and callosities: Secondary | ICD-10-CM | POA: Diagnosis not present

## 2022-10-23 NOTE — Progress Notes (Signed)
Subjective:   Patient ID: Rickey Wright, male   DOB: 62 y.o.   MRN: [id number]   HPI Chief Complaint  Patient presents with   Callouses     Left foot, under 5th digit    62 year old male presents the office today with the above concerns.  States that he is getting discomfort pointing to submetatarsal 5 on the left foot where he has the discomfort.  He has a callus he had this about 10 years ago and was trimmed off.  States the symptoms started to come back.  No open sores or any swelling or redness or any drainage.  No recent treatment otherwise.  Last A1c was 6.9 on 2/29/204 Last BS is 147 currently    Review of Systems  All other systems reviewed and are negative.  Past Medical History:  Diagnosis Date   ADD (attention deficit disorder)    Allergy    Anemia    hx iron deficiency    Arthritis    "knees, shoulders, wrists, ankles, back" (06/22/2014)   Asthma    "sports induced only"   DDD (degenerative disc disease), lumbosacral    Depression    Diabetes mellitus, type 2 ([place])    Hyperlipidemia    on meds   Hypertension    on meds   Hypothyroidism    on meds   Localized osteoarthritis of left knee 06/27/2012   Loose body of left knee 06/27/2012   OSA (obstructive sleep apnea)    mild not on cpap   Primary localized osteoarthritis of right knee 06/22/2014    Past Surgical History:  Procedure Laterality Date   HAND TENDON SURGERY Left 2008   x2   KNEE ARTHROSCOPY Right 1981   1993, 2006   KNEE ARTHROSCOPY  06/27/2012   Procedure: ARTHROSCOPY KNEE;  Surgeon: Johnny Bridge, MD;  Location: [facility];  Service: Orthopedics;  Laterality: Left;  left knee arthroscopy with removal loose foreign body , with debridement/shaving( chondroplasty)   RHINOPLASTY  2000   ROTATOR CUFF REPAIR Left 2021   TOTAL KNEE ARTHROPLASTY Right 06/22/2014   Procedure: RIGHT TOTAL KNEE ARTHROPLASTY;  Surgeon: Johnny Bridge, MD;  Location: [facility];  Service: Orthopedics;   Laterality: Right;     Current Outpatient Medications:    albuterol (VENTOLIN HFA) 108 (90 Base) MCG/ACT inhaler, Inhale 2 puffs into the lungs every 6 (six) hours as needed for wheezing or shortness of breath., Disp: 1 each, Rfl: 2   aspirin EC 81 MG tablet, Take 81 mg by mouth daily., Disp: , Rfl:    atorvastatin (LIPITOR) 40 MG tablet, Take 40 mg by mouth daily., Disp: , Rfl:    B-D UF III MINI PEN NEEDLES 31G X 5 MM MISC, SMARTSIG:Injection Daily, Disp: , Rfl:    benazepril (LOTENSIN) 10 MG tablet, Take 10 mg by mouth daily.  , Disp: , Rfl:    Cholecalciferol 50 MCG (2000 UT) CAPS, Take 1 capsule by mouth daily at 6 (six) AM., Disp: , Rfl:    Continuous Blood Gluc Sensor (FREESTYLE LIBRE 14 DAY SENSOR) MISC, Apply topically as directed., Disp: , Rfl:    Continuous Blood Gluc Sensor (FREESTYLE LIBRE 14 DAY SENSOR) MISC, See admin instructions., Disp: , Rfl:    ezetimibe (ZETIA) 10 MG tablet, Take 10 mg by mouth daily.  , Disp: , Rfl:    Insulin Pen Needle (B-D UF III MINI PEN NEEDLES) 31G X 5 MM MISC, Use as directed for injection once  a day, Disp: , Rfl:    Insulin Pen Needle (B-D UF III MINI PEN NEEDLES) 31G X 5 MM MISC, Use one pen needle SQ 2 times daily to admin Toujeo once daily and Victoza once daily., Disp: , Rfl:    Insulin Pen Needle (NOVOTWIST) 32G X 5 MM MISC, USE FOR INJECTIONS TWICE A DAY, Disp: , Rfl:    JARDIANCE 25 MG TABS tablet, Take 25 mg by mouth every morning., Disp: , Rfl:    levothyroxine (SYNTHROID) 88 MCG tablet, , Disp: , Rfl:    loratadine-pseudoephedrine (CLARITIN-D 12-HOUR) 5-120 MG per tablet, Take 1 tablet by mouth daily., Disp: , Rfl:    meloxicam (MOBIC) 15 MG tablet, Take 15 mg by mouth daily., Disp: , Rfl:    metFORMIN (GLUCOPHAGE) 500 MG tablet, Take 1,000-1,500 mg by mouth See admin instructions. Take 1000 mg by mouth in the morning and 1500 mg by mouth at bedtime, Disp: , Rfl:    pioglitazone (ACTOS) 45 MG tablet, Take 45 mg by mouth daily. , Disp: ,  Rfl:    Semaglutide,0.25 or 0.5MG /DOS, (OZEMPIC, 0.25 OR 0.5 MG/DOSE,) 2 MG/1.5ML SOPN, Inject 0.25 mg weekly for 4 weeks, then 0.5 mg weekly for diabetes, Disp: , Rfl:    TOUJEO MAX SOLOSTAR 300 UNIT/ML Solostar Pen, Inject 3 mLs into the skin daily at 6 (six) AM., Disp: , Rfl:    VASCEPA 1 g capsule, Take 2 g by mouth 3 (three) times daily., Disp: , Rfl:    Cyanocobalamin 5000 MCG CAPS, Take 1 capsule by mouth every other day., Disp: , Rfl:    doxycycline (VIBRA-TABS) 100 MG tablet, Take 1 tablet (100 mg total) by mouth 2 (two) times daily., Disp: 14 tablet, Rfl: 0   Insulin Syringe-Needle U-100 (GLOBAL INJECT EASE INSULIN SYR) 30G X 1/2" 0.3 ML MISC, Use for vitamin B12 injections as directed, Disp: , Rfl:    VALIUM 5 MG tablet, , Disp: , Rfl:   Allergies  Allergen Reactions   Horse-Derived Products Other (See Comments)    Unknown   Other Other (See Comments)    Horse-derived Products REACTION: UNKNOWN Unknown          Objective:  Physical Exam  General: AAO x3, NAD  Dermatological: Hyperkeratotic lesion left foot submetatarsal 5 without any underlying ulceration drainage or signs of infection.  There are small punctate annular lesions without any signs of infection, puncture wounds or foreign objects or infection.  Vascular: Dorsalis Pedis artery and Posterior Tibial artery pedal pulses are 2/4 bilateral with immedate capillary fill time.  There is no pain with calf compression, swelling, warmth, erythema.   Neruologic: Grossly intact via light touch bilateral.   Musculoskeletal: Prominence of metatarsal heads plantarly with atrophy of the fat pad.  Gait: Unassisted, Nonantalgic.       Assessment:   62 year old male with preulcerative callus with prominent metatarsal heads, diabetes     Plan:  -Treatment options discussed including all alternatives, risks, and complications -Etiology of symptoms were discussed -Sharply debrided the hyperkeratotic lesion without any  complications or bleeding.  Recommend moisturizer, offloading.  I do think evidence of orthotics to help offload and help prevent further ulceration and also help with pain.  He was given the codes to check orthotic benefit coverage and the limits of he wants to proceed with this.  The meantime continue shoes with good arch support. -Daily foot inspection    Trula Slade DPM

## 2023-01-06 ENCOUNTER — Telehealth: Payer: 59 | Admitting: Physician Assistant

## 2023-01-06 DIAGNOSIS — U071 COVID-19: Secondary | ICD-10-CM

## 2023-01-06 MED ORDER — BENZONATATE 100 MG PO CAPS
100.0000 mg | ORAL_CAPSULE | Freq: Three times a day (TID) | ORAL | 0 refills | Status: DC | PRN
Start: 2023-01-06 — End: 2023-04-16

## 2023-01-06 MED ORDER — NIRMATRELVIR/RITONAVIR (PAXLOVID)TABLET
3.0000 | ORAL_TABLET | Freq: Two times a day (BID) | ORAL | 0 refills | Status: DC
Start: 2023-01-06 — End: 2023-01-10

## 2023-01-06 NOTE — Patient Instructions (Signed)
Rickey Wright, thank you for joining Tylene Fantasia Ward, PA-C for today's virtual visit.  While this provider is not your primary care provider (PCP), if your PCP is located in our provider database this encounter information will be shared with them immediately following your visit.   A [facility] MyChart account gives you access to today's visit and all your visits, tests, and labs performed at [facility] " click here if you don't have a [facility] MyChart account or go to [email or web address]  Consent: (Patient) Rickey Wright provided verbal consent for this virtual visit at the beginning of the encounter.  Current Medications:  Current Outpatient Medications:    benzonatate (TESSALON) 100 MG capsule, Take 1 capsule (100 mg total) by mouth 3 (three) times daily as needed., Disp: 20 capsule, Rfl: 0   nirmatrelvir/ritonavir (PAXLOVID) 20 x 150 MG & 10 x 100MG  TABS, Take 3 tablets by mouth 2 (two) times daily for 5 days. (Take nirmatrelvir 150 mg two tablets twice daily for 5 days and ritonavir 100 mg one tablet twice daily for 5 days) Patient GFR is >90, Disp: 30 tablet, Rfl: 0   albuterol (VENTOLIN HFA) 108 (90 Base) MCG/ACT inhaler, Inhale 2 puffs into the lungs every 6 (six) hours as needed for wheezing or shortness of breath., Disp: 1 each, Rfl: 2   aspirin EC 81 MG tablet, Take 81 mg by mouth daily., Disp: , Rfl:    atorvastatin (LIPITOR) 40 MG tablet, Take 40 mg by mouth daily., Disp: , Rfl:    B-D UF III MINI PEN NEEDLES 31G X 5 MM MISC, SMARTSIG:Injection Daily, Disp: , Rfl:    benazepril (LOTENSIN) 10 MG tablet, Take 10 mg by mouth daily.  , Disp: , Rfl:    Cholecalciferol 50 MCG (2000 UT) CAPS, Take 1 capsule by mouth daily at 6 (six) AM., Disp: , Rfl:    Continuous Blood Gluc Sensor (FREESTYLE LIBRE 14 DAY SENSOR) MISC, Apply topically as directed., Disp: , Rfl:    Continuous Blood Gluc Sensor (FREESTYLE LIBRE 14 DAY SENSOR) MISC, See admin instructions., Disp: ,  Rfl:    Cyanocobalamin 5000 MCG CAPS, Take 1 capsule by mouth every other day., Disp: , Rfl:    doxycycline (VIBRA-TABS) 100 MG tablet, Take 1 tablet (100 mg total) by mouth 2 (two) times daily., Disp: 14 tablet, Rfl: 0   ezetimibe (ZETIA) 10 MG tablet, Take 10 mg by mouth daily.  , Disp: , Rfl:    Insulin Pen Needle (B-D UF III MINI PEN NEEDLES) 31G X 5 MM MISC, Use as directed for injection once a day, Disp: , Rfl:    Insulin Pen Needle (B-D UF III MINI PEN NEEDLES) 31G X 5 MM MISC, Use one pen needle SQ 2 times daily to admin Toujeo once daily and Victoza once daily., Disp: , Rfl:    Insulin Pen Needle (NOVOTWIST) 32G X 5 MM MISC, USE FOR INJECTIONS TWICE A DAY, Disp: , Rfl:    Insulin Syringe-Needle U-100 (GLOBAL INJECT EASE INSULIN SYR) 30G X 1/2" 0.3 ML MISC, Use for vitamin B12 injections as directed, Disp: , Rfl:    JARDIANCE 25 MG TABS tablet, Take 25 mg by mouth every morning., Disp: , Rfl:    levothyroxine (SYNTHROID) 88 MCG tablet, , Disp: , Rfl:    loratadine-pseudoephedrine (CLARITIN-D 12-HOUR) 5-120 MG per tablet, Take 1 tablet by mouth daily., Disp: , Rfl:    meloxicam (MOBIC) 15 MG tablet, Take 15 mg by mouth daily., Disp: ,  Rfl:    metFORMIN (GLUCOPHAGE) 500 MG tablet, Take 1,000-1,500 mg by mouth See admin instructions. Take 1000 mg by mouth in the morning and 1500 mg by mouth at bedtime, Disp: , Rfl:    pioglitazone (ACTOS) 45 MG tablet, Take 45 mg by mouth daily. , Disp: , Rfl:    Semaglutide,0.25 or 0.5MG /DOS, (OZEMPIC, 0.25 OR 0.5 MG/DOSE,) 2 MG/1.5ML SOPN, Inject 0.25 mg weekly for 4 weeks, then 0.5 mg weekly for diabetes, Disp: , Rfl:    TOUJEO MAX SOLOSTAR 300 UNIT/ML Solostar Pen, Inject 3 mLs into the skin daily at 6 (six) AM., Disp: , Rfl:    VALIUM 5 MG tablet, , Disp: , Rfl:    VASCEPA 1 g capsule, Take 2 g by mouth 3 (three) times daily., Disp: , Rfl:    Medications ordered in this encounter:  Meds ordered this encounter  Medications   nirmatrelvir/ritonavir  (PAXLOVID) 20 x 150 MG & 10 x 100MG  TABS    Sig: Take 3 tablets by mouth 2 (two) times daily for 5 days. (Take nirmatrelvir 150 mg two tablets twice daily for 5 days and ritonavir 100 mg one tablet twice daily for 5 days) Patient GFR is >90    Dispense:  30 tablet    Refill:  0    Order Specific Question:   Supervising Provider    Answer:   Merrilee Jansky [[id number]]   benzonatate (TESSALON) 100 MG capsule    Sig: Take 1 capsule (100 mg total) by mouth 3 (three) times daily as needed.    Dispense:  20 capsule    Refill:  0    Order Specific Question:   Supervising Provider    Answer:   Merrilee Jansky [id number]     *If you need refills on other medications prior to your next appointment, please contact your pharmacy*  Follow-Up: Call back or seek an in-person evaluation if the symptoms worsen or if the condition fails to improve as anticipated.  [facility] [phone]  Other Instructions Take Paxlovid as prescribed.  Recommend Mucinex and flonase for congestion and postnasal drip.  Can take Tessalon as needed for cough. Use inhaler as needed for wheezing or shortness of breath.    If you have been instructed to have an in-person evaluation today at a local Urgent Care facility, please use the link below. It will take you to a list of all of our available [facility], including address, phone number and hours of operation. Please do not delay care.  [facility]  If you or a family member do not have a primary care provider, use the link below to schedule a visit and establish care. When you choose a [facility] primary care physician or advanced practice provider, you gain a long-term partner in health. Find a Primary Care Provider  Learn more about [facility]'s in-office and virtual care options: [facility] - Get Care Now
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
## 2023-01-07 ENCOUNTER — Encounter: Payer: Self-pay | Admitting: Internal Medicine

## 2023-01-07 NOTE — Telephone Encounter (Signed)
Yes dont take  the paxlovid  doubt any significant adverse problem .  Also you may want to contact he vidoe provider if you think this will not be self revolving ove a week or so  and need more help.

## 2023-01-08 NOTE — Telephone Encounter (Signed)
Spoke to pt. Inform him of provider's message.   Pt states he doesn't think he needs an antibiotic, wants to know if he should let his sx runs it course and just take OTC. He states he will ask the provider whom he was recently seen.   No further action is needed.

## 2023-01-10 ENCOUNTER — Telehealth: Payer: 59 | Admitting: Physician Assistant

## 2023-01-10 DIAGNOSIS — B9689 Other specified bacterial agents as the cause of diseases classified elsewhere: Secondary | ICD-10-CM | POA: Diagnosis not present

## 2023-01-10 DIAGNOSIS — J019 Acute sinusitis, unspecified: Secondary | ICD-10-CM | POA: Diagnosis not present

## 2023-01-10 MED ORDER — DOXYCYCLINE HYCLATE 100 MG PO TABS
100.0000 mg | ORAL_TABLET | Freq: Two times a day (BID) | ORAL | 0 refills | Status: DC
Start: 2023-01-10 — End: 2023-04-16

## 2023-01-10 NOTE — Patient Instructions (Signed)
Rickey Wright, thank you for joining Piedad Climes, PA-C for today's virtual visit.  While this provider is not your primary care provider (PCP), if your PCP is located in our provider database this encounter information will be shared with them immediately following your visit.   A [facility] MyChart account gives you access to today's visit and all your visits, tests, and labs performed at [facility] " click here if you don't have a [facility] MyChart account or go to [email or web address]  Consent: (Patient) Rickey Wright provided verbal consent for this virtual visit at the beginning of the encounter.  Current Medications:  Current Outpatient Medications:    doxycycline (VIBRA-TABS) 100 MG tablet, Take 1 tablet (100 mg total) by mouth 2 (two) times daily., Disp: 20 tablet, Rfl: 0   albuterol (VENTOLIN HFA) 108 (90 Base) MCG/ACT inhaler, Inhale 2 puffs into the lungs every 6 (six) hours as needed for wheezing or shortness of breath., Disp: 1 each, Rfl: 2   aspirin EC 81 MG tablet, Take 81 mg by mouth daily., Disp: , Rfl:    atorvastatin (LIPITOR) 40 MG tablet, Take 40 mg by mouth daily., Disp: , Rfl:    B-D UF III MINI PEN NEEDLES 31G X 5 MM MISC, SMARTSIG:Injection Daily, Disp: , Rfl:    benazepril (LOTENSIN) 10 MG tablet, Take 10 mg by mouth daily.  , Disp: , Rfl:    benzonatate (TESSALON) 100 MG capsule, Take 1 capsule (100 mg total) by mouth 3 (three) times daily as needed., Disp: 20 capsule, Rfl: 0   Cholecalciferol 50 MCG (2000 UT) CAPS, Take 1 capsule by mouth daily at 6 (six) AM., Disp: , Rfl:    Continuous Blood Gluc Sensor (FREESTYLE LIBRE 14 DAY SENSOR) MISC, Apply topically as directed., Disp: , Rfl:    ezetimibe (ZETIA) 10 MG tablet, Take 10 mg by mouth daily.  , Disp: , Rfl:    Insulin Pen Needle (B-D UF III MINI PEN NEEDLES) 31G X 5 MM MISC, Use as directed for injection once a day, Disp: , Rfl:    Insulin Pen Needle (B-D UF III MINI PEN NEEDLES)  31G X 5 MM MISC, Use one pen needle SQ 2 times daily to admin Toujeo once daily and Victoza once daily., Disp: , Rfl:    JARDIANCE 25 MG TABS tablet, Take 25 mg by mouth every morning., Disp: , Rfl:    levothyroxine (SYNTHROID) 88 MCG tablet, , Disp: , Rfl:    loratadine-pseudoephedrine (CLARITIN-D 12-HOUR) 5-120 MG per tablet, Take 1 tablet by mouth daily., Disp: , Rfl:    meloxicam (MOBIC) 15 MG tablet, Take 15 mg by mouth daily., Disp: , Rfl:    metFORMIN (GLUCOPHAGE) 500 MG tablet, Take 1,000-1,500 mg by mouth See admin instructions. Take 1000 mg by mouth in the morning and 1500 mg by mouth at bedtime, Disp: , Rfl:    pioglitazone (ACTOS) 45 MG tablet, Take 45 mg by mouth daily. , Disp: , Rfl:    Semaglutide,0.25 or 0.5MG /DOS, (OZEMPIC, 0.25 OR 0.5 MG/DOSE,) 2 MG/1.5ML SOPN, Inject 0.25 mg weekly for 4 weeks, then 0.5 mg weekly for diabetes, Disp: , Rfl:    TOUJEO MAX SOLOSTAR 300 UNIT/ML Solostar Pen, Inject 3 mLs into the skin daily at 6 (six) AM., Disp: , Rfl:    VASCEPA 1 g capsule, Take 2 g by mouth 3 (three) times daily., Disp: , Rfl:    Medications ordered in this encounter:  Meds ordered this encounter  Medications   doxycycline (VIBRA-TABS) 100 MG tablet    Sig: Take 1 tablet (100 mg total) by mouth 2 (two) times daily.    Dispense:  20 tablet    Refill:  0    Order Specific Question:   Supervising Provider    Answer:   Merrilee Jansky [id number]     *If you need refills on other medications prior to your next appointment, please contact your pharmacy*  Follow-Up: Call back or seek an in-person evaluation if the symptoms worsen or if the condition fails to improve as anticipated.  [facility] [phone]  Other Instructions Please take antibiotic as directed.  Increase fluid intake.  Use Saline nasal spray.  Take a daily multivitamin. Continue your regular medications as directed. .  Place a humidifier in the bedroom.  Please call or return clinic if  symptoms are not improving.  Sinusitis Sinusitis is redness, soreness, and swelling (inflammation) of the paranasal sinuses. Paranasal sinuses are air pockets within the bones of your face (beneath the eyes, the middle of the forehead, or above the eyes). In healthy paranasal sinuses, mucus is able to drain out, and air is able to circulate through them by way of your nose. However, when your paranasal sinuses are inflamed, mucus and air can become trapped. This can allow bacteria and other germs to grow and cause infection. Sinusitis can develop quickly and last only a short time (acute) or continue over a long period (chronic). Sinusitis that lasts for more than 12 weeks is considered chronic.  CAUSES  Causes of sinusitis include: Allergies. Structural abnormalities, such as displacement of the cartilage that separates your nostrils (deviated septum), which can decrease the air flow through your nose and sinuses and affect sinus drainage. Functional abnormalities, such as when the small hairs (cilia) that line your sinuses and help remove mucus do not work properly or are not present. SYMPTOMS  Symptoms of acute and chronic sinusitis are the same. The primary symptoms are pain and pressure around the affected sinuses. Other symptoms include: Upper toothache. Earache. Headache. Bad breath. Decreased sense of smell and taste. A cough, which worsens when you are lying flat. Fatigue. Fever. Thick drainage from your nose, which often is green and may contain pus (purulent). Swelling and warmth over the affected sinuses. DIAGNOSIS  Your caregiver will perform a physical exam. During the exam, your caregiver may: Look in your nose for signs of abnormal growths in your nostrils (nasal polyps). Tap over the affected sinus to check for signs of infection. View the inside of your sinuses (endoscopy) with a special imaging device with a light attached (endoscope), which is inserted into your  sinuses. If your caregiver suspects that you have chronic sinusitis, one or more of the following tests may be recommended: Allergy tests. Nasal culture A sample of mucus is taken from your nose and sent to a lab and screened for bacteria. Nasal cytology A sample of mucus is taken from your nose and examined by your caregiver to determine if your sinusitis is related to an allergy. TREATMENT  Most cases of acute sinusitis are related to a viral infection and will resolve on their own within 10 days. Sometimes medicines are prescribed to help relieve symptoms (pain medicine, decongestants, nasal steroid sprays, or saline sprays).  However, for sinusitis related to a bacterial infection, your caregiver will prescribe antibiotic medicines. These are medicines that will help kill the bacteria causing the infection.  Rarely, sinusitis is caused  by a fungal infection. In theses cases, your caregiver will prescribe antifungal medicine. For some cases of chronic sinusitis, surgery is needed. Generally, these are cases in which sinusitis recurs more than 3 times per year, despite other treatments. HOME CARE INSTRUCTIONS  Drink plenty of water. Water helps thin the mucus so your sinuses can drain more easily. Use a humidifier. Inhale steam 3 to 4 times a day (for example, sit in the bathroom with the shower running). Apply a warm, moist washcloth to your face 3 to 4 times a day, or as directed by your caregiver. Use saline nasal sprays to help moisten and clean your sinuses. Take over-the-counter or prescription medicines for pain, discomfort, or fever only as directed by your caregiver. SEEK IMMEDIATE MEDICAL CARE IF: You have increasing pain or severe headaches. You have nausea, vomiting, or drowsiness. You have swelling around your face. You have vision problems. You have a stiff neck. You have difficulty breathing. MAKE SURE YOU:  Understand these instructions. Will watch your condition. Will  get help right away if you are not doing well or get worse. Document Released: 07/23/2005 Document Revised: 10/15/2011 Document Reviewed: 08/07/2011 [facility] Patient Information 2014 [place], [place].    If you have been instructed to have an in-person evaluation today at a local Urgent Care facility, please use the link below. It will take you to a list of all of our available [facility], including address, phone number and hours of operation. Please do not delay care.  [facility]  If you or a family member do not have a primary care provider, use the link below to schedule a visit and establish care. When you choose a [facility] primary care physician or advanced practice provider, you gain a long-term partner in health. Find a Primary Care Provider  Learn more about [facility]'s in-office and virtual care options: [facility] - Get Care Now
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
## 2023-04-15 NOTE — Progress Notes (Signed)
Chief Complaint  Patient presents with   Pre-op Exam    HPI: Patient  Rickey Wright  62 y.o. comes in today for preoperative evaluation and medical optimization.  For  Revision of R TKA. Dr Blanchie Dessert  , Date TBD. He has been having Hx of falling from  loss of bending on r knee and limitation  OM 30 degrees  .   To do a readjustment to get better  mobility.   Under care by endocrine Dr Ulla Potash  for : DM : under  care  stable and controlled DM insulin metformin and Semiglutide .Jardiance  BP is in range  on benazapril  HLD on Atorva 40 per day and zetia  Hx recurrent sinusitis  stable  Thyroid: on replacement ADDADHD  OSA   I'm proved with weight loss   No hx of unusual bleeding . Resp problem  , active heart disease, sx  Health Maintenance  Topic Date Due   Diabetic kidney evaluation - Urine ACR  Never done   Hepatitis C Screening  Never done   Zoster Vaccines- Shingrix (1 of 2) Never done   OPHTHALMOLOGY EXAM  01/12/2020   HEMOGLOBIN A1C  05/24/2020   Diabetic kidney evaluation - eGFR measurement  11/22/2020   INFLUENZA VACCINE  03/07/2023   COVID-19 Vaccine (4 - 2023-24 season) 04/07/2023   FOOT EXAM  10/23/2023   Colonoscopy  09/06/2024   DTaP/Tdap/Td (3 - Td or Tdap) 02/01/2029   HIV Screening  Completed   HPV VACCINES  Aged Out   No tobacco    Sleep: :  osa when  weight was 270 mild and now  better  .    ROS:  GEN/ HEENT: No fever, significant weight changes sweats headaches vision problems hearing changes, CV/ PULM; No chest pain shortness of breath cough, syncope,edema  change in exercise tolerance. GI /GU: No adominal pain, vomiting, change in bowel habits. No blood in the stool. No significant GU symptoms. SKIN/HEME: ,no acute skin rashes suspicious lesions or bleeding. No lymphadenopathy, nodules, masses.  NEURO/ PSYCH:  No neurologic signs such as weakness numbness. No depression anxiety. IMM/ Allergy: No unusual infections.  Allergy .    REST of 12 system review negative except as per HPI   Past Medical History:  Diagnosis Date   ADD (attention deficit disorder)    Allergy    Anemia    hx iron deficiency    Arthritis    "knees, shoulders, wrists, ankles, back" (06/22/2014)   Asthma    "sports induced only"   DDD (degenerative disc disease), lumbosacral    Depression    Diabetes mellitus, type 2 (HCC)    Hyperlipidemia    on meds   Hypertension    on meds   Hypothyroidism    on meds   Localized osteoarthritis of left knee 06/27/2012   Loose body of left knee 06/27/2012   OSA (obstructive sleep apnea)    mild not on cpap   Primary localized osteoarthritis of right knee 06/22/2014    Past Surgical History:  Procedure Laterality Date   HAND TENDON SURGERY Left 2008   x2   KNEE ARTHROSCOPY Right 1981   1993, 2006   KNEE ARTHROSCOPY  06/27/2012   Procedure: ARTHROSCOPY KNEE;  Surgeon: Eulas Post, MD;  Location: [facility];  Service: Orthopedics;  Laterality: Left;  left knee arthroscopy with removal loose foreign body , with debridement/shaving( chondroplasty)   RHINOPLASTY  2000  ROTATOR CUFF REPAIR Left 2021   TOTAL KNEE ARTHROPLASTY Right 06/22/2014   Procedure: RIGHT TOTAL KNEE ARTHROPLASTY;  Surgeon: Eulas Post, MD;  Location: [facility];  Service: Orthopedics;  Laterality: Right;    Family History  Problem Relation Age of Onset   Cancer Mother 54       bile duct cancer   Heart disease Father        69s    Aneurysm Father        AAA smoker   Colon polyps Sister 1   Colon cancer Maternal Grandfather 76   Colon polyps Maternal Grandfather 90   Cancer Maternal Grandfather        colon   Diabetes Paternal Grandmother    Seizures Daughter    Thyroid disease Daughter    ADD / ADHD Son    Learning disabilities Son    Esophageal cancer Neg Hx    Rectal cancer Neg Hx    Stomach cancer Neg Hx     Social History   Socioeconomic History   Marital status: Married     Spouse name: Not on file   Number of children: Not on file   Years of education: Not on file   Highest education level: Master's degree (e.g., MA, MS, MEng, MEd, MSW, MBA)  Occupational History   Not on file  Tobacco Use   Smoking status: Never   Smokeless tobacco: Never  Vaping Use   Vaping status: Never Used  Substance and Sexual Activity   Alcohol use: Not Currently   Drug use: No   Sexual activity: Yes  Other Topics Concern   Not on file  Social History Narrative   5 children  Married    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   Non smoker    H H of 7 pet dog   Social Determinants of Health   Financial Resource Strain: Low Risk  (10/17/2021)   Overall Financial Resource Strain (CARDIA)    Difficulty of Paying Living Expenses: Not hard at all  Food Insecurity: No Food Insecurity (10/17/2021)   Hunger Vital Sign    Worried About Running Out of Food in the Last Year: Never true    Ran Out of Food in the Last Year: Never true  Transportation Needs: No Transportation Needs (10/17/2021)   PRAPARE - Administrator, Civil Service (Medical): No    Lack of Transportation (Non-Medical): No  Physical Activity: Insufficiently Active (10/17/2021)   Exercise Vital Sign    Days of Exercise per Week: 1 day    Minutes of Exercise per Session: 60 min  Stress: Stress Concern Present (10/17/2021)   [employer] of Occupational Health - Occupational Stress Questionnaire    Feeling of Stress : To some extent  Social Connections: Unknown (11/01/2022)   Received from [facility]   Social Network    Social Network: Not on file    Outpatient Medications Prior to Visit  Medication Sig Dispense Refill   albuterol (VENTOLIN HFA) 108 (90 Base) MCG/ACT inhaler Inhale 2 puffs into the lungs every 6 (six) hours as needed for wheezing or shortness of breath. 1 each 2   aspirin EC 81 MG tablet Take 81 mg by mouth daily.     atorvastatin (LIPITOR) 40 MG tablet Take 40 mg by mouth daily.      benazepril (LOTENSIN) 10 MG tablet Take 10 mg by mouth daily.       Cholecalciferol 50 MCG (2000 UT) CAPS Take 1  capsule by mouth daily at 6 (six) AM.     Continuous Blood Gluc Sensor (FREESTYLE LIBRE 14 DAY SENSOR) MISC Apply topically as directed.     ezetimibe (ZETIA) 10 MG tablet Take 10 mg by mouth daily.       Insulin Pen Needle (B-D UF III MINI PEN NEEDLES) 31G X 5 MM MISC Use as directed for injection once a day     Insulin Pen Needle (B-D UF III MINI PEN NEEDLES) 31G X 5 MM MISC Use one pen needle SQ 2 times daily to admin Toujeo once daily and Victoza once daily.     JARDIANCE 25 MG TABS tablet Take 25 mg by mouth every morning.     levothyroxine (SYNTHROID) 88 MCG tablet      loratadine-pseudoephedrine (CLARITIN-D 12-HOUR) 5-120 MG per tablet Take 1 tablet by mouth daily.     meloxicam (MOBIC) 15 MG tablet Take 15 mg by mouth daily.     metFORMIN (GLUCOPHAGE) 500 MG tablet Take 1,000-1,500 mg by mouth See admin instructions. Take 1000 mg by mouth in the morning and 1500 mg by mouth at bedtime     pioglitazone (ACTOS) 45 MG tablet Take 45 mg by mouth daily.      Semaglutide,0.25 or 0.5MG /DOS, (OZEMPIC, 0.25 OR 0.5 MG/DOSE,) 2 MG/1.5ML SOPN Inject 0.25 mg weekly for 4 weeks, then 0.5 mg weekly for diabetes     TOUJEO MAX SOLOSTAR 300 UNIT/ML Solostar Pen Inject 3 mLs into the skin daily at 6 (six) AM. 22 units     VASCEPA 1 g capsule Take 2 g by mouth 3 (three) times daily.     B-D UF III MINI PEN NEEDLES 31G X 5 MM MISC SMARTSIG:Injection Daily     benzonatate (TESSALON) 100 MG capsule Take 1 capsule (100 mg total) by mouth 3 (three) times daily as needed. 20 capsule 0   doxycycline (VIBRA-TABS) 100 MG tablet Take 1 tablet (100 mg total) by mouth 2 (two) times daily. 20 tablet 0   No facility-administered medications prior to visit.     EXAM:  BP (!) 100/50 (BP Location: Left Arm, Patient Position: Sitting, Cuff Size: Large)   Pulse 78   Temp 97.9 F (36.6 C) (Oral)   Ht 5'  9.5" (1.765 m)   Wt 219 lb 3.2 oz (99.4 kg)   SpO2 98%   BMI 31.91 kg/m   Body mass index is 31.91 kg/m. Wt Readings from Last 3 Encounters:  04/16/23 219 lb 3.2 oz (99.4 kg)  10/17/21 225 lb 9.6 oz (102.3 kg)  09/06/21 210 lb (95.3 kg)    Physical Exam: Vital signs reviewed ZOX:WRUE is a well-developed well-nourished alert cooperative    who appearsr stated age in no acute distress.  HEENT: normocephalic atraumatic , Eyes: PERRL EOM's full, conjunctiva clear, Nares: paten,t no deformity discharge or tenderness., Ears: no deformity EAC's clear TMs with normal landmarks. Mouth: clear OP, no lesions, edema.  Moist mucous membranes. Dentition in adequate repair. NECK: supple without masses, thyromegaly or bruits. CHEST/PULM:  Clear to auscultation and percussion breath sounds equal no wheeze , rales or rhonchi.  CV: PMI is nondisplaced, S1 S2 no gallops, murmurs, rubs. Peripheral pulses are full without delay.No JVD .  ABDOMEN: Bowel sounds normal nontender  No guard or rebound, no hepato splenomegal no CVA tenderness.  . Ventral hernia mod  no pain or mass   Extremtities:  No clubbing cyanosis or edema, no acute joint swelling or redness no focal atrophy NEURO:  Oriented x3, cranial nerves 3-12 appear to be intact, no obvious focal weakness,gait within normal limits no abnormal reflexes or asymmetrical SKIN: No acute rashes normal turgor, color, no bruising or petechiae. PSYCH: Oriented, good eye contact, no obvious depression anxiety, cognition and judgment appear normal. LN: no cervical axillary adenopathy  Lab Results  Component Value Date   WBC 8.9 07/19/2018   HGB 14.5 07/19/2018   HCT 46.1 07/19/2018   PLT 279 07/19/2018   GLUCOSE 299 (H) 07/19/2018   CHOL 101 11/23/2019   TRIG 96 11/23/2019   HDL 29 (A) 11/23/2019   LDLCALC 62 11/23/2019   ALT 39 11/23/2019   AST 32 11/23/2019   NA 136 (A) 11/23/2019   K 4.2 11/23/2019   CL 101 11/23/2019   CREATININE 0.9 11/23/2019    BUN 21 11/23/2019   CO2 24 (A) 11/23/2019   TSH 2.76 11/23/2019   PSA 0.11 12/19/2010   HGBA1C 6.7 11/23/2019   See  care everywhere for recent labs  BP Readings from Last 3 Encounters:  04/16/23 (!) 100/50  10/17/21 130/72  09/06/21 (!) 101/55    Lab reviewed in care everywhere  Creatinine 1.03 gfr 82 Nl lfts  A1c is 6.9 TC 110 ldl  61  ASSESSMENT AND PLAN:  Discussed the following assessment and plan:    ICD-10-CM   1. Arthritis of right knee  M17.11     2. Pre-operative clearance  Z01.818     3. Essential hypertension  I10     4. Controlled type 2 diabetes mellitus without complication, with long-term current use of insulin (HCC)  E11.9    Z79.4     5. OSA (obstructive sleep apnea)  G47.33     6. Hypothyroidism, unspecified type  E03.9     7. Hyperlipidemia, unspecified hyperlipidemia type  E78.5     8. Attention deficit hyperactivity disorder (ADHD), unspecified ADHD type  F90.9     Medical conditions (mostly managed by endocrinology) optimized.  No CI to surgery  Will send form to surgeon with Note.   Return for as indicated.  Patient Care Team: Mazzie Brodrick, Neta Mends, MD as PCP - General Janee Morn Viviann Spare, MD as Referring Physician (Internal Medicine) Patient Instructions  Good to see you today .   Will send form to your surgeon .   Optimization  medically.   Other through endocrinology .    Neta Mends. Fue Cervenka M.D.

## 2023-04-16 ENCOUNTER — Ambulatory Visit (INDEPENDENT_AMBULATORY_CARE_PROVIDER_SITE_OTHER): Payer: 59 | Admitting: Internal Medicine

## 2023-04-16 ENCOUNTER — Encounter: Payer: Self-pay | Admitting: Internal Medicine

## 2023-04-16 VITALS — BP 100/50 | HR 78 | Temp 97.9°F | Ht 69.5 in | Wt 219.2 lb

## 2023-04-16 DIAGNOSIS — E119 Type 2 diabetes mellitus without complications: Secondary | ICD-10-CM

## 2023-04-16 DIAGNOSIS — Z794 Long term (current) use of insulin: Secondary | ICD-10-CM

## 2023-04-16 DIAGNOSIS — E785 Hyperlipidemia, unspecified: Secondary | ICD-10-CM

## 2023-04-16 DIAGNOSIS — F909 Attention-deficit hyperactivity disorder, unspecified type: Secondary | ICD-10-CM

## 2023-04-16 DIAGNOSIS — M1711 Unilateral primary osteoarthritis, right knee: Secondary | ICD-10-CM | POA: Diagnosis not present

## 2023-04-16 DIAGNOSIS — E039 Hypothyroidism, unspecified: Secondary | ICD-10-CM

## 2023-04-16 DIAGNOSIS — Z7984 Long term (current) use of oral hypoglycemic drugs: Secondary | ICD-10-CM

## 2023-04-16 DIAGNOSIS — G4733 Obstructive sleep apnea (adult) (pediatric): Secondary | ICD-10-CM

## 2023-04-16 DIAGNOSIS — Z01818 Encounter for other preprocedural examination: Secondary | ICD-10-CM

## 2023-04-16 DIAGNOSIS — I10 Essential (primary) hypertension: Secondary | ICD-10-CM

## 2023-04-16 NOTE — Patient Instructions (Addendum)
Good to see you today .   Will send form to your surgeon .   Optimization  medically.   Other through endocrinology .

## 2023-04-19 ENCOUNTER — Encounter: Payer: Self-pay | Admitting: Internal Medicine

## 2023-05-28 ENCOUNTER — Other Ambulatory Visit (HOSPITAL_COMMUNITY): Payer: Self-pay | Admitting: Physical Medicine and Rehabilitation

## 2023-05-28 DIAGNOSIS — M545 Low back pain, unspecified: Secondary | ICD-10-CM

## 2023-06-01 ENCOUNTER — Ambulatory Visit (HOSPITAL_COMMUNITY)
Admission: RE | Admit: 2023-06-01 | Discharge: 2023-06-01 | Disposition: A | Discharge status: Home / Self Care | Payer: 59 | Source: Ambulatory Visit | Attending: Physical Medicine and Rehabilitation | Admitting: Physical Medicine and Rehabilitation

## 2023-06-01 DIAGNOSIS — M545 Low back pain, unspecified: Secondary | ICD-10-CM | POA: Insufficient documentation

## 2023-06-03 ENCOUNTER — Telehealth: Payer: Self-pay

## 2023-06-03 NOTE — Telephone Encounter (Signed)
Received a fax on PreOperative Risk Assessment form from [employer] on 05/31/2023.  The form was already faxed on 04/16/2023.  Attempted to reach the scheduler: Bonney Leitz.  Left a voicemail to call us back.

## 2023-06-11 ENCOUNTER — Telehealth: Payer: Self-pay | Admitting: Internal Medicine

## 2023-06-11 NOTE — Progress Notes (Signed)
Surgery orders requested via Epic inbox. °

## 2023-06-11 NOTE — Telephone Encounter (Signed)
Attempted to reach Rickey Wright.   Left a voicemail to call us back .

## 2023-06-11 NOTE — Telephone Encounter (Signed)
Nena Alexander murphy/wainer is calling to let md know she received medical clearance form

## 2023-06-12 NOTE — Telephone Encounter (Signed)
Noted  

## 2023-06-14 ENCOUNTER — Ambulatory Visit: Payer: Self-pay | Admitting: Emergency Medicine

## 2023-06-14 DIAGNOSIS — M25661 Stiffness of right knee, not elsewhere classified: Secondary | ICD-10-CM

## 2023-06-14 DIAGNOSIS — M24661 Ankylosis, right knee: Secondary | ICD-10-CM

## 2023-06-14 DIAGNOSIS — Z96651 Presence of right artificial knee joint: Secondary | ICD-10-CM
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
## 2023-06-14 NOTE — Progress Notes (Signed)
Second request for pre op orders in [facility]: Left voicemail for [employer]

## 2023-06-17 NOTE — Patient Instructions (Signed)
DUE TO COVID-19 ONLY TWO VISITORS  (aged 62 and older)  ARE ALLOWED TO COME WITH YOU AND STAY IN THE WAITING ROOM ONLY DURING PRE OP AND PROCEDURE.   **NO VISITORS ARE ALLOWED IN THE SHORT STAY AREA OR RECOVERY ROOM!!**  IF YOU WILL BE ADMITTED INTO THE HOSPITAL YOU ARE ALLOWED ONLY FOUR SUPPORT PEOPLE DURING VISITATION HOURS ONLY (7 AM -8PM)   The support person(s) must pass our screening, gel in and out, and wear a mask at all times, including in the patient's room. Patients must also wear a mask when staff or their support person are in the room. Visitors GUEST BADGE MUST BE WORN VISIBLY  One adult visitor may remain with you overnight and MUST be in the room by 8 P.M.     Your procedure is scheduled on: 07/01/23   Report to [facility] Main Entrance    Report to admitting at : 5:15 AM   Call this number if you have problems the morning of surgery [phone]   Do not eat food :After Midnight.   After Midnight you may have the following liquids until: 4:30 AM DAY OF SURGERY  Water Black Coffee (sugar ok, NO MILK/CREAM OR CREAMERS)  Tea (sugar ok, NO MILK/CREAM OR CREAMERS) regular and decaf                             Plain Jell-O (NO RED)                                           Fruit ices (not with fruit pulp, NO RED)                                     Popsicles (NO RED)                                                                  Juice: apple, WHITE grape, WHITE cranberry Sports drinks like Gatorade (NO RED)   The day of surgery:  Drink ONE (1) Pre-Surgery Clear G2 at : 4:30 AM the morning of surgery. Drink in one sitting. Do not sip.  This drink was given to you during your hospital  pre-op appointment visit. Nothing else to drink after completing the  Pre-Surgery Clear Ensure or G2.          If you have questions, please contact your surgeon's office.  FOLLOW ANY ADDITIONAL PRE OP INSTRUCTIONS YOU RECEIVED FROM YOUR SURGEON'S OFFICE!!!   Oral Hygiene  is also important to reduce your risk of infection.                                    Remember - BRUSH YOUR TEETH THE MORNING OF SURGERY WITH YOUR REGULAR TOOTHPASTE  DENTURES WILL BE REMOVED PRIOR TO SURGERY PLEASE DO NOT APPLY "Poly grip" OR ADHESIVES!!!   Do NOT smoke after Midnight   Take these medicines the morning of surgery with  A SIP OF WATER: levothyroxine.Claritin as needed.Use inhalers as usual.  How to Manage Your Diabetes Before and After Surgery  Why is it important to control my blood sugar before and after surgery? Improving blood sugar levels before and after surgery helps healing and can limit problems. A way of improving blood sugar control is eating a healthy diet by:  Eating less sugar and carbohydrates  Increasing activity/exercise  Talking with your doctor about reaching your blood sugar goals High blood sugars (greater than 180 mg/dL) can raise your risk of infections and slow your recovery, so you will need to focus on controlling your diabetes during the weeks before surgery. Make sure that the doctor who takes care of your diabetes knows about your planned surgery including the date and location.  How do I manage my blood sugar before surgery? Check your blood sugar at least 4 times a day, starting 2 days before surgery, to make sure that the level is not too high or low. Check your blood sugar the morning of your surgery when you wake up and every 2 hours until you get to the Short Stay unit. If your blood sugar is less than 70 mg/dL, you will need to treat for low blood sugar: Do not take insulin. Treat a low blood sugar (less than 70 mg/dL) with  cup of clear juice (cranberry or apple), 4 glucose tablets, OR glucose gel. Recheck blood sugar in 15 minutes after treatment (to make sure it is greater than 70 mg/dL). If your blood sugar is not greater than 70 mg/dL on recheck, call [phone] for further instructions. Report your blood sugar to the short stay  nurse when you get to Short Stay.  If you are admitted to the hospital after surgery: Your blood sugar will be checked by the staff and you will probably be given insulin after surgery (instead of oral diabetes medicines) to make sure you have good blood sugar levels. The goal for blood sugar control after surgery is 80-180 mg/dL.   WHAT DO I DO ABOUT MY DIABETES MEDICATION?   THE MORNING OF SURGERY, take ONLY half of the dose of toujeo insulin. DO NOT TAKE ANY ORAL DIABETIC MEDICATIONS DAY OF YOUR SURGERY  DO NOT TAKE THE FOLLOWING 7 DAYS PRIOR TO SURGERY: Ozempic, Wegovy, Rybelsus (Semaglutide), Byetta (exenatide), Bydureon (exenatide ER), Victoza, Saxenda (liraglutide), or Trulicity (dulaglutide) Mounjaro (Tirzepatide) Adlyxin (Lixisenatide), Polyethylene Glycol Loxenatide. HOLD semaglutide after: 06/23/23                You may not have any metal on your body including hair pins, jewelry, and body piercing             Do not wear lotions, powders, perfumes/cologne, or deodorant              Men may shave face and neck.   Do not bring valuables to the hospital. [facility] IS NOT             RESPONSIBLE   FOR VALUABLES.   Contacts, glasses, or bridgework may not be worn into surgery.   Bring small overnight bag day of surgery.   DO NOT BRING YOUR HOME MEDICATIONS TO THE HOSPITAL. PHARMACY WILL DISPENSE MEDICATIONS LISTED ON YOUR MEDICATION LIST TO YOU DURING YOUR ADMISSION IN THE HOSPITAL!    Patients discharged on the day of surgery will not be allowed to drive home.  Someone NEEDS to stay with you for the first 24 hours after anesthesia.  Special Instructions: Bring a copy of your healthcare power of attorney and living will documents         the day of surgery if you haven't scanned them before.              Please read over the following fact sheets you were given: IF YOU HAVE QUESTIONS ABOUT YOUR PRE-OP INSTRUCTIONS PLEASE CALL [phone]      Pre-operative 5 CHG  Bath Instructions   You can play a key role in reducing the risk of infection after surgery. Your skin needs to be as free of germs as possible. You can reduce the number of germs on your skin by washing with CHG (chlorhexidine gluconate) soap before surgery. CHG is an antiseptic soap that kills germs and continues to kill germs even after washing.   DO NOT use if you have an allergy to chlorhexidine/CHG or antibacterial soaps. If your skin becomes reddened or irritated, stop using the CHG and notify one of our RNs at : [phone].   Please shower with the CHG soap starting 4 days before surgery using the following schedule:     Please keep in mind the following:  DO NOT shave, including legs and underarms, starting the day of your first shower.   You may shave your face at any point before/day of surgery.  Place clean sheets on your bed the day you start using CHG soap. Use a clean washcloth (not used since being washed) for each shower. DO NOT sleep with pets once you start using the CHG.   CHG Shower Instructions:  If you choose to wash your hair and private area, wash first with your normal shampoo/soap.  After you use shampoo/soap, rinse your hair and body thoroughly to remove shampoo/soap residue.  Turn the water OFF and apply about 3 tablespoons (45 ml) of CHG soap to a CLEAN washcloth.  Apply CHG soap ONLY FROM YOUR NECK DOWN TO YOUR TOES (washing for 3-5 minutes)  DO NOT use CHG soap on face, private areas, open wounds, or sores.  Pay special attention to the area where your surgery is being performed.  If you are having back surgery, having someone wash your back for you may be helpful. Wait 2 minutes after CHG soap is applied, then you may rinse off the CHG soap.  Pat dry with a clean towel  Put on clean clothes/pajamas   If you choose to wear lotion, please use ONLY the CHG-compatible lotions on the back of this paper.     Additional instructions for the day of  surgery: DO NOT APPLY any lotions, deodorants, cologne, or perfumes.   Put on clean/comfortable clothes.  Brush your teeth.  Ask your nurse before applying any prescription medications to the skin.   CHG Compatible Lotions   Aveeno Moisturizing lotion  Cetaphil Moisturizing Cream  Cetaphil Moisturizing Lotion  Clairol Herbal Essence Moisturizing Lotion, Dry Skin  Clairol Herbal Essence Moisturizing Lotion, Extra Dry Skin  Clairol Herbal Essence Moisturizing Lotion, Normal Skin  Curel Age Defying Therapeutic Moisturizing Lotion with Alpha Hydroxy  Curel Extreme Care Body Lotion  Curel Soothing Hands Moisturizing Hand Lotion  Curel Therapeutic Moisturizing Cream, Fragrance-Free  Curel Therapeutic Moisturizing Lotion, Fragrance-Free  Curel Therapeutic Moisturizing Lotion, Original Formula  Eucerin Daily Replenishing Lotion  Eucerin Dry Skin Therapy Plus Alpha Hydroxy Crme  Eucerin Dry Skin Therapy Plus Alpha Hydroxy Lotion  Eucerin Original Crme  Eucerin Original Lotion  Eucerin Plus Crme Eucerin Plus Lotion  Eucerin TriLipid Replenishing Lotion  Keri Anti-Bacterial Hand Lotion  Keri Deep Conditioning Original Lotion Dry Skin Formula Softly Scented  Keri Deep Conditioning Original Lotion, Fragrance Free Sensitive Skin Formula  Keri Lotion Fast Absorbing Fragrance Free Sensitive Skin Formula  Keri Lotion Fast Absorbing Softly Scented Dry Skin Formula  Keri Original Lotion  Keri Skin Renewal Lotion Keri Silky Smooth Lotion  Keri Silky Smooth Sensitive Skin Lotion  Nivea Body Creamy Conditioning Oil  Nivea Body Extra Enriched Lotion  Nivea Body Original Lotion  Nivea Body Sheer Moisturizing Lotion Nivea Crme  Nivea Skin Firming Lotion  NutraDerm 30 Skin Lotion  NutraDerm Skin Lotion  NutraDerm Therapeutic Skin Cream  NutraDerm Therapeutic Skin Lotion  ProShield Protective Hand Cream  Provon moisturizing lotion   Incentive Spirometer  An incentive spirometer is a tool  that can help keep your lungs clear and active. This tool measures how well you are filling your lungs with each breath. Taking long deep breaths may help reverse or decrease the chance of developing breathing (pulmonary) problems (especially infection) following: A long period of time when you are unable to move or be active. BEFORE THE PROCEDURE  If the spirometer includes an indicator to show your best effort, your nurse or respiratory therapist will set it to a desired goal. If possible, sit up straight or lean slightly forward. Try not to slouch. Hold the incentive spirometer in an upright position. INSTRUCTIONS FOR USE  Sit on the edge of your bed if possible, or sit up as far as you can in bed or on a chair. Hold the incentive spirometer in an upright position. Breathe out normally. Place the mouthpiece in your mouth and seal your lips tightly around it. Breathe in slowly and as deeply as possible, raising the piston or the ball toward the top of the column. Hold your breath for 3-5 seconds or for as long as possible. Allow the piston or ball to fall to the bottom of the column. Remove the mouthpiece from your mouth and breathe out normally. Rest for a few seconds and repeat Steps 1 through 7 at least 10 times every 1-2 hours when you are awake. Take your time and take a few normal breaths between deep breaths. The spirometer may include an indicator to show your best effort. Use the indicator as a goal to work toward during each repetition. After each set of 10 deep breaths, practice coughing to be sure your lungs are clear. If you have an incision (the cut made at the time of surgery), support your incision when coughing by placing a pillow or rolled up towels firmly against it. Once you are able to get out of bed, walk around indoors and cough well. You may stop using the incentive spirometer when instructed by your caregiver.  RISKS AND COMPLICATIONS Take your time so you do not get  dizzy or light-headed. If you are in pain, you may need to take or ask for pain medication before doing incentive spirometry. It is harder to take a deep breath if you are having pain. AFTER USE Rest and breathe slowly and easily. It can be helpful to keep track of a log of your progress. Your caregiver can provide you with a simple table to help with this. If you are using the spirometer at home, follow these instructions: SEEK MEDICAL CARE IF:  You are having difficultly using the spirometer. You have trouble using the spirometer as often as instructed. Your pain medication is not giving  enough relief while using the spirometer. You develop fever of 100.5 F (38.1 C) or higher. SEEK IMMEDIATE MEDICAL CARE IF:  You cough up bloody sputum that had not been present before. You develop fever of 102 F (38.9 C) or greater. You develop worsening pain at or near the incision site. MAKE SURE YOU:  Understand these instructions. Will watch your condition. Will get help right away if you are not doing well or get worse. Document Released: 12/03/2006 Document Revised: 10/15/2011 Document Reviewed: 02/03/2007 [facility] Patient Information 2014 [place], [place].   ________________________________________________________________________

## 2023-06-18 ENCOUNTER — Other Ambulatory Visit (HOSPITAL_COMMUNITY): Payer: 59

## 2023-06-18 ENCOUNTER — Encounter (HOSPITAL_COMMUNITY)
Admission: RE | Admit: 2023-06-18 | Discharge: 2023-06-18 | Disposition: A | Discharge status: Home / Self Care | Payer: 59 | Source: Ambulatory Visit | Attending: Anesthesiology | Admitting: Anesthesiology

## 2023-06-18 DIAGNOSIS — E119 Type 2 diabetes mellitus without complications: Secondary | ICD-10-CM

## 2023-06-18 DIAGNOSIS — I10 Essential (primary) hypertension: Secondary | ICD-10-CM

## 2023-06-18 DIAGNOSIS — Z01818 Encounter for other preprocedural examination: Secondary | ICD-10-CM

## 2023-07-11 ENCOUNTER — Encounter (HOSPITAL_COMMUNITY): Admission: RE | Admit: 2023-07-11 | Payer: 59 | Source: Ambulatory Visit

## 2023-07-24 ENCOUNTER — Ambulatory Visit (HOSPITAL_COMMUNITY): Admit: 2023-07-24 | Payer: 59 | Admitting: Orthopedic Surgery

## 2023-07-24 SURGERY — TOTAL KNEE REVISION
Anesthesia: Choice | Site: Knee | Laterality: Right

## 2024-03-13 LAB — HEMOGLOBIN A1C: Hemoglobin A1C: 6.2

## 2024-03-31 ENCOUNTER — Encounter: Payer: Self-pay | Admitting: Internal Medicine

## 2024-03-31 ENCOUNTER — Ambulatory Visit (INDEPENDENT_AMBULATORY_CARE_PROVIDER_SITE_OTHER): Payer: Self-pay | Admitting: Internal Medicine

## 2024-03-31 VITALS — BP 94/62 | HR 62 | Temp 97.8°F | Ht 69.5 in | Wt 218.6 lb

## 2024-03-31 DIAGNOSIS — Z794 Long term (current) use of insulin: Secondary | ICD-10-CM

## 2024-03-31 DIAGNOSIS — I10 Essential (primary) hypertension: Secondary | ICD-10-CM

## 2024-03-31 DIAGNOSIS — E119 Type 2 diabetes mellitus without complications: Secondary | ICD-10-CM | POA: Diagnosis not present

## 2024-03-31 DIAGNOSIS — Z01818 Encounter for other preprocedural examination: Secondary | ICD-10-CM | POA: Diagnosis not present

## 2024-03-31 DIAGNOSIS — L814 Other melanin hyperpigmentation: Secondary | ICD-10-CM

## 2024-03-31 DIAGNOSIS — M48061 Spinal stenosis, lumbar region without neurogenic claudication: Secondary | ICD-10-CM

## 2024-03-31 DIAGNOSIS — E039 Hypothyroidism, unspecified: Secondary | ICD-10-CM | POA: Diagnosis not present

## 2024-03-31 NOTE — Patient Instructions (Addendum)
 Good to see you today .  The pigmented spot may be ok but  if new and growing would have  dermatology check.   Will send preop evaluation  to  your surgeon Make sure to get your regular eye check.
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
## 2024-08-15 ENCOUNTER — Encounter: Payer: Self-pay | Admitting: Gastroenterology
